# Patient Record
Sex: Male | Born: 1940
Health system: Southern US, Community
[De-identification: ages and names within clinical notes are randomized; demographics above are authoritative.]

## PROBLEM LIST (undated history)

## (undated) DIAGNOSIS — R569 Unspecified convulsions: Secondary | ICD-10-CM

## (undated) DIAGNOSIS — H269 Unspecified cataract: Secondary | ICD-10-CM

## (undated) DIAGNOSIS — K219 Gastro-esophageal reflux disease without esophagitis: Secondary | ICD-10-CM

## (undated) DIAGNOSIS — H409 Unspecified glaucoma: Secondary | ICD-10-CM

## (undated) DIAGNOSIS — C20 Malignant neoplasm of rectum: Secondary | ICD-10-CM

## (undated) DIAGNOSIS — H9190 Unspecified hearing loss, unspecified ear: Secondary | ICD-10-CM

## (undated) DIAGNOSIS — N400 Enlarged prostate without lower urinary tract symptoms: Secondary | ICD-10-CM

## (undated) DIAGNOSIS — M1990 Unspecified osteoarthritis, unspecified site: Secondary | ICD-10-CM

## (undated) DIAGNOSIS — C649 Malignant neoplasm of unspecified kidney, except renal pelvis: Secondary | ICD-10-CM

## (undated) HISTORY — PX: COLONOSCOPY: SHX174

## (undated) HISTORY — PX: APPENDECTOMY: SHX54

## (undated) HISTORY — DX: Unspecified hearing loss, unspecified ear: H91.90

## (undated) HISTORY — DX: Unspecified osteoarthritis, unspecified site: M19.90

## (undated) HISTORY — DX: Unspecified cataract: H26.9

## (undated) HISTORY — DX: Unspecified glaucoma: H40.9

## (undated) SURGERY — Surgical Case
Anesthesia: *Unknown

---

## 2017-03-26 ENCOUNTER — Inpatient Hospital Stay (HOSPITAL_COMMUNITY)
Admission: EM | Admit: 2017-03-26 | Discharge: 2017-04-01 | DRG: 330 | Disposition: A | Discharge status: Home Health | Payer: Medicare HMO | Attending: General Surgery | Admitting: General Surgery

## 2017-03-26 ENCOUNTER — Emergency Department (HOSPITAL_COMMUNITY): Payer: Medicare HMO

## 2017-03-26 ENCOUNTER — Encounter (HOSPITAL_COMMUNITY): Payer: Self-pay | Admitting: Emergency Medicine

## 2017-03-26 DIAGNOSIS — E871 Hypo-osmolality and hyponatremia: Secondary | ICD-10-CM | POA: Diagnosis present

## 2017-03-26 DIAGNOSIS — K56609 Unspecified intestinal obstruction, unspecified as to partial versus complete obstruction: Secondary | ICD-10-CM | POA: Diagnosis present

## 2017-03-26 DIAGNOSIS — C642 Malignant neoplasm of left kidney, except renal pelvis: Secondary | ICD-10-CM | POA: Diagnosis present

## 2017-03-26 DIAGNOSIS — D49519 Neoplasm of unspecified behavior of unspecified kidney: Secondary | ICD-10-CM

## 2017-03-26 DIAGNOSIS — C189 Malignant neoplasm of colon, unspecified: Secondary | ICD-10-CM | POA: Diagnosis present

## 2017-03-26 DIAGNOSIS — N2889 Other specified disorders of kidney and ureter: Secondary | ICD-10-CM

## 2017-03-26 DIAGNOSIS — R1310 Dysphagia, unspecified: Secondary | ICD-10-CM | POA: Diagnosis present

## 2017-03-26 DIAGNOSIS — D62 Acute posthemorrhagic anemia: Secondary | ICD-10-CM | POA: Diagnosis not present

## 2017-03-26 DIAGNOSIS — Z9049 Acquired absence of other specified parts of digestive tract: Secondary | ICD-10-CM

## 2017-03-26 DIAGNOSIS — R739 Hyperglycemia, unspecified: Secondary | ICD-10-CM | POA: Diagnosis present

## 2017-03-26 DIAGNOSIS — Z87891 Personal history of nicotine dependence: Secondary | ICD-10-CM | POA: Diagnosis not present

## 2017-03-26 DIAGNOSIS — Z0189 Encounter for other specified special examinations: Secondary | ICD-10-CM

## 2017-03-26 DIAGNOSIS — K6389 Other specified diseases of intestine: Secondary | ICD-10-CM

## 2017-03-26 HISTORY — DX: Gastro-esophageal reflux disease without esophagitis: K21.9

## 2017-03-26 HISTORY — DX: Unspecified convulsions: R56.9

## 2017-03-26 LAB — CBC WITH DIFFERENTIAL/PLATELET
BASOS PCT: 0 %
Basophils Absolute: 0 10*3/uL (ref 0.0–0.1)
EOS ABS: 0 10*3/uL (ref 0.0–0.7)
EOS PCT: 0 %
HCT: 36.7 % — ABNORMAL LOW (ref 39.0–52.0)
Hemoglobin: 12.4 g/dL — ABNORMAL LOW (ref 13.0–17.0)
LYMPHS ABS: 0.9 10*3/uL (ref 0.7–4.0)
Lymphocytes Relative: 6 %
MCH: 29 pg (ref 26.0–34.0)
MCHC: 33.8 g/dL (ref 30.0–36.0)
MCV: 85.9 fL (ref 78.0–100.0)
MONO ABS: 0.9 10*3/uL (ref 0.1–1.0)
MONOS PCT: 7 %
Neutro Abs: 11.7 10*3/uL — ABNORMAL HIGH (ref 1.7–7.7)
Neutrophils Relative %: 87 %
PLATELETS: 327 10*3/uL (ref 150–400)
RBC: 4.27 MIL/uL (ref 4.22–5.81)
RDW: 13.1 % (ref 11.5–15.5)
WBC: 13.5 10*3/uL — ABNORMAL HIGH (ref 4.0–10.5)

## 2017-03-26 LAB — COMPREHENSIVE METABOLIC PANEL
ALK PHOS: 63 U/L (ref 38–126)
ALT: 20 U/L (ref 17–63)
AST: 19 U/L (ref 15–41)
Albumin: 3.1 g/dL — ABNORMAL LOW (ref 3.5–5.0)
Anion gap: 8 (ref 5–15)
BUN: 13 mg/dL (ref 6–20)
CALCIUM: 8.9 mg/dL (ref 8.9–10.3)
CHLORIDE: 101 mmol/L (ref 101–111)
CO2: 24 mmol/L (ref 22–32)
CREATININE: 0.88 mg/dL (ref 0.61–1.24)
GFR calc non Af Amer: >60 mL/min (ref >60)
Glucose, Bld: 203 mg/dL — ABNORMAL HIGH (ref 65–99)
Potassium: 3.8 mmol/L (ref 3.5–5.1)
SODIUM: 133 mmol/L — AB (ref 135–145)
Total Bilirubin: 0.6 mg/dL (ref 0.3–1.2)
Total Protein: 6.7 g/dL (ref 6.5–8.1)

## 2017-03-26 LAB — URINALYSIS, ROUTINE W REFLEX MICROSCOPIC
BILIRUBIN URINE: NEGATIVE
Glucose, UA: NEGATIVE mg/dL
HGB URINE DIPSTICK: NEGATIVE
KETONES UR: NEGATIVE mg/dL
Leukocytes, UA: NEGATIVE
NITRITE: NEGATIVE
Protein, ur: NEGATIVE mg/dL
SPECIFIC GRAVITY, URINE: 1.044 — AB (ref 1.005–1.030)
pH: 5 (ref 5.0–8.0)

## 2017-03-26 LAB — LIPASE, BLOOD: LIPASE: 23 U/L (ref 11–51)

## 2017-03-26 LAB — GLUCOSE, CAPILLARY: Glucose-Capillary: 179 mg/dL — ABNORMAL HIGH (ref 65–99)

## 2017-03-26 MED ORDER — INSULIN ASPART 100 UNIT/ML ~~LOC~~ SOLN
0.0000 [IU] | SUBCUTANEOUS | Status: DC
  Administered 2017-03-27 (×6): 3 [IU] via SUBCUTANEOUS
  Administered 2017-03-28: 5 [IU] via SUBCUTANEOUS
  Administered 2017-03-28 (×2): 3 [IU] via SUBCUTANEOUS
  Administered 2017-03-28: 5 [IU] via SUBCUTANEOUS
  Administered 2017-03-29: 3 [IU] via SUBCUTANEOUS
  Administered 2017-03-29 (×2): 2 [IU] via SUBCUTANEOUS
  Administered 2017-03-29: 3 [IU] via SUBCUTANEOUS
  Administered 2017-03-29 – 2017-04-01 (×8): 2 [IU] via SUBCUTANEOUS

## 2017-03-26 MED ORDER — MORPHINE SULFATE (PF) 2 MG/ML IV SOLN: 2.0000 mg | INTRAVENOUS | Status: DC | PRN

## 2017-03-26 MED ORDER — PANTOPRAZOLE SODIUM 40 MG IV SOLR
40.0000 mg | Freq: Every day | INTRAVENOUS | Status: DC
  Administered 2017-03-26 – 2017-03-30 (×5): 40 mg via INTRAVENOUS
  Filled 2017-03-26 (×5): qty 40

## 2017-03-26 MED ORDER — ONDANSETRON 4 MG PO TBDP: 4.0000 mg | ORAL_TABLET | Freq: Four times a day (QID) | ORAL | Status: DC | PRN

## 2017-03-26 MED ORDER — IOPAMIDOL (ISOVUE-300) INJECTION 61%
INTRAVENOUS | Status: AC
  Administered 2017-03-26: 100 mL
  Filled 2017-03-26: qty 100

## 2017-03-26 MED ORDER — DIPHENHYDRAMINE HCL 50 MG/ML IJ SOLN
12.5000 mg | Freq: Four times a day (QID) | INTRAMUSCULAR | Status: DC | PRN
  Administered 2017-03-31: 12.5 mg via INTRAVENOUS
  Filled 2017-03-26: qty 1

## 2017-03-26 MED ORDER — POTASSIUM CHLORIDE 2 MEQ/ML IV SOLN
INTRAVENOUS | Status: DC
  Administered 2017-03-26 – 2017-03-27 (×4): via INTRAVENOUS
  Filled 2017-03-26 (×5): qty 1000

## 2017-03-26 MED ORDER — DIPHENHYDRAMINE HCL 12.5 MG/5ML PO ELIX: 12.5000 mg | ORAL_SOLUTION | Freq: Four times a day (QID) | ORAL | Status: DC | PRN

## 2017-03-26 MED ORDER — ONDANSETRON HCL 4 MG/2ML IJ SOLN: 4.0000 mg | Freq: Four times a day (QID) | INTRAMUSCULAR | Status: DC | PRN

## 2017-03-26 NOTE — ED Provider Notes (Signed)
[facility] DEPT Provider Note   CSN: [id number] Arrival date & time: 03/26/17  1035     History   Chief Complaint Chief Complaint  Patient presents with  . Constipation    HPI Romney Compean is a 76 y.o. male.  76yo M who p/w abdominal pain and constipation. He began having diffuse abdominal pain and discomfort a few weeks ago that has been intermittent and mild. It became worse last night and had been severe although it is mild currently. BMs have been looser than normal but non-bloody, and he reports associated gas. He takes aspirin every other day for pain. He does endorse alcohol use more on the weekends. No associated nausea/vomiting. He reports recent mild dysuria but none currently. No fevers, chest pain or SOB.   The history is provided by the patient.  Constipation      History reviewed. No pertinent past medical history.  There are no active problems to display for this patient.   No past surgical history on file.     Home Medications    Prior to Admission medications   Medication Sig Start Date End Date Taking? Authorizing Provider  aspirin 81 MG chewable tablet Chew 81 mg by mouth every other day.   Yes Historical Provider, MD  [employer] ANTACID/ANTI-GAS 400-400-40 MG/5ML suspension Take 30 mLs by mouth 3 (three) times daily as needed for nausea. 03/16/17  Yes Historical Provider, MD  [employer] D3 1000 units capsule Take 1,000 Units by mouth daily. 02/01/17  Yes Historical Provider, MD  omeprazole (PRILOSEC) 20 MG capsule Take 20 mg by mouth 2 (two) times daily. 03/16/17  Yes Historical Provider, MD    Family History No family history on file.  Social History Social History  Substance Use Topics  . Smoking status: Not on file  . Smokeless tobacco: Not on file  . Alcohol use Not on file     Allergies   Patient has no known allergies.   Review of Systems Review of Systems  Gastrointestinal: Positive for constipation.  All other systems reviewed and are  negative.    Physical Exam Updated Vital Signs BP (!) 143/89   Pulse 81   Temp 97.9 F (36.6 C) (Oral)   Resp 18   Ht 5\' 11"  (1.803 m)   Wt 196 lb (88.9 kg)   SpO2 93%   BMI 27.34 kg/m   Physical Exam  Constitutional: He is oriented to person, place, and time. He appears well-developed and well-nourished. No distress.  HENT:  Head: Normocephalic and atraumatic.  Moist mucous membranes  Eyes: Conjunctivae are normal. Pupils are equal, round, and reactive to light.  Neck: Neck supple.  Cardiovascular: Normal rate, regular rhythm and normal heart sounds.   No murmur heard. Pulmonary/Chest: Effort normal and breath sounds normal.  Abdominal: Soft. Bowel sounds are normal. He exhibits distension. There is tenderness. There is no rebound and no guarding.  Mild RLQ and suprapubic abd tenderness, abdomen distended but soft, no peritonitis  Musculoskeletal: He exhibits no edema.  Neurological: He is alert and oriented to person, place, and time.  Fluent speech Hard of hearing  Skin: Skin is warm and dry.  Psychiatric: He has a normal mood and affect. Judgment normal.  Nursing note and vitals reviewed.    ED Treatments / Results  Labs (all labs ordered are listed, but only abnormal results are displayed) Labs Reviewed  COMPREHENSIVE METABOLIC PANEL - Abnormal; Notable for the following:       Result Value  Sodium 133 (*)    Glucose, Bld 203 (*)    Albumin 3.1 (*)    All other components within normal limits  CBC WITH DIFFERENTIAL/PLATELET - Abnormal; Notable for the following:    WBC 13.5 (*)    Hemoglobin 12.4 (*)    HCT 36.7 (*)    Neutro Abs 11.7 (*)    All other components within normal limits  URINALYSIS, ROUTINE W REFLEX MICROSCOPIC - Abnormal; Notable for the following:    Specific Gravity, Urine 1.044 (*)    All other components within normal limits  LIPASE, BLOOD    EKG  EKG Interpretation None       Radiology Ct Abdomen Pelvis W Contrast  Result  Date: 03/26/2017 CLINICAL DATA:  Abdominal pain. No bowel movement. Mildly Elevated white blood cell count. EXAM: CT ABDOMEN AND PELVIS WITH CONTRAST TECHNIQUE: Multidetector CT imaging of the abdomen and pelvis was performed using the standard protocol following bolus administration of intravenous contrast. CONTRAST:  16mL ISOVUE-300 IOPAMIDOL (ISOVUE-300) INJECTION 61% COMPARISON:  None. FINDINGS: Lower chest: Lung bases are clear. Hepatobiliary: No focal hepatic lesion. No biliary duct dilatation. Gallbladder is normal. Common bile duct is normal. Pancreas: Pancreas is normal. No ductal dilatation. No pancreatic inflammation. Spleen: Normal spleen Adrenals/urinary tract: Adrenal glands normal. Along the anterior cortex of theLEFT kidney is a 2.6 cm round enhancing lesion (image 41, series 3). The ureters and bladder normal. Stomach/Bowel: Stomach, duodenum, small bowel are normal. Appendix is not identified. The cecum and ascending colon are gas-filled and distended. The transverse colon is gas-filled and moderately distended. There is long segment of circumferential bowel wall thickening to 1 cm and luminal narrowing involving the splenic flexure of the colon extending over approximately 12 cm segment (image 60, series 6). There is pericolonic inflammation within the adjacent pericolonic fat the mesenteric. There is fluid along the LEFT pericolic gutter (image 43, series 3). There are no diverticulum through this region. There are several diverticulum more distally in the descending colon and sigmoid colon. There is a region of low attenuation within the bowel wall along the anti mesenteric border measuring 18 mm by 13 mm (image 40, series 3). No perforation or abscess. Distal to this segment of circumferential thickening and diffuse bowel wall thickening the sigmoid colon is collapsed. The rectum is collapsed. Vascular/Lymphatic: Minimal intimal calcification of the abdominal aorta. The celiac axis and SMA are  widely patent. IMA is patent. Reproductive: Prostate normal Other: No free fluid.  Fat filled LEFT inguinal hernia. Musculoskeletal: No aggressive osseous lesion. IMPRESSION: 1. 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 Differential would also include segmental colitis including ischemic colitis. The inflammation and fluid along the pericolic gutter would favor colitis. The partial obstructive pattern would favor colorectal carcinoma. No significant diverticular disease through this region. 2. Distention of the colon proximal to the segmental colonic lesion consistent with partial mechanical obstruction by the lesion. 3. No evidence of lymphadenopathy in the abdomen pelvis. 4. 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 Electronically Signed   By: Suzy Bouchard M.D.   On: 03/26/2017 15:21    Procedures Procedures (including critical care time)  Medications Ordered in ED Medications  iopamidol (ISOVUE-300) 61 % injection (100 mLs  Contrast Given 03/26/17 1423)     Initial Impression / Assessment and Plan / ED Course  I have reviewed the triage  vital signs and the nursing notes.  Pertinent labs & imaging results that were available during my care of the patient were reviewed by me and considered in my medical decision making (see chart for details).    PT w/ 1 week of Intermittent abdominal pain and bloating. He had reassuring vital signs and was well-appearing on exam. He did have abdominal distention but abdomen was soft, mild tenderness in lower abdomen but no rebound or peritonitis. Labwork shows WBC 13.5, glucose 203, otherwise unremarkable. Obtained CT of abdomen and pelvis to evaluate for acute process such as bowel obstruction or ileus.  CT shows partial distal obstruction with long segment of colon in thick and  concerning for colorectal carcinoma. 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 I discussed findings with general surgery, Dr. Grandville Silos, appreciate his assistance. General surgery will evaluate the patient and I anticipate admission for further management of his obstructing mass. Final Clinical Impressions(s) / ED Diagnoses   Final diagnoses:  None    New Prescriptions New Prescriptions   No medications on file     Sharlett Iles, MD 03/26/17 [phone]

## 2017-03-26 NOTE — ED Triage Notes (Signed)
Pt reports his last normal BM was several weeks ago. Pt had a small BM last night. Pt reports increased bloating, belching and gas. PT reports abdominal discomfort over the last several days.

## 2017-03-26 NOTE — ED Notes (Signed)
Patient family was given Catheryn Bacon, White Crackers and Peanut Butter, W/ a Coke.

## 2017-03-26 NOTE — H&P (Signed)
Shawn Chambers is an 76 y.o. male.   Chief Complaint: Abdominal distention, crampy abdominal pain, belching HPI: Kylian complains of several weeks of progressive abdominal distention. He has had a lot of belching and passing gas from below, more than usual. He has not vomited but he says most things he has tried to eat did not agree with him. He developed some abdominal pain last night and came to the emergency department. Workup revealed mild hyponatremia, leukocytosis of 13,500, and CT scan of the abdomen and pelvis was performed. This demonstrates a long segment obstructing process in the splenic flexure, colon cancer versus inflammatory process. Incidentally, he also has a 2.6CM L renal mass consistent with a carcinoma. I was asked to see him for surgical management. He does not see doctors regularly and denies any significant health problems. He has not had a colonoscopy. He quit smoking 2 weeks ago.  History reviewed. No pertinent past medical history.  Past surgical history: Appendectomy, right wrist surgery  No family history on file. Social History: Quit smoking 2 weeks ago, no alcohol or drug use  Allergies: No Known Allergies   (Not in a hospital admission)  Results for orders placed or performed during the hospital encounter of 03/26/17 (from the past 48 hour(s))  Comprehensive metabolic panel     Status: Abnormal   Collection Time: 03/26/17 11:27 AM  Result Value Ref Range   Sodium 133 (L) 135 - 145 mmol/L   Potassium 3.8 3.5 - 5.1 mmol/L   Chloride 101 101 - 111 mmol/L   CO2 24 22 - 32 mmol/L   Glucose, Bld 203 (H) 65 - 99 mg/dL   BUN 13 6 - 20 mg/dL   Creatinine, Ser 0.88 0.61 - 1.24 mg/dL   Calcium 8.9 8.9 - 10.3 mg/dL   Total Protein 6.7 6.5 - 8.1 g/dL   Albumin 3.1 (L) 3.5 - 5.0 g/dL   AST 19 15 - 41 U/L   ALT 20 17 - 63 U/L   Alkaline Phosphatase 63 38 - 126 U/L   Total Bilirubin 0.6 0.3 - 1.2 mg/dL   GFR calc non Af Amer >60 >60 mL/min   GFR calc Af Amer >60 >60  mL/min    Comment: (NOTE) The eGFR has been calculated using the CKD EPI equation. This calculation has not been validated in all clinical situations. eGFR's persistently <60 mL/min signify possible Chronic Kidney Disease.    Anion gap 8 5 - 15  CBC with Differential     Status: Abnormal   Collection Time: 03/26/17 11:27 AM  Result Value Ref Range   WBC 13.5 (H) 4.0 - 10.5 K/uL   RBC 4.27 4.22 - 5.81 MIL/uL   Hemoglobin 12.4 (L) 13.0 - 17.0 g/dL   HCT 36.7 (L) 39.0 - 52.0 %   MCV 85.9 78.0 - 100.0 fL   MCH 29.0 26.0 - 34.0 pg   MCHC 33.8 30.0 - 36.0 g/dL   RDW 13.1 11.5 - 15.5 %   Platelets 327 150 - 400 K/uL   Neutrophils Relative % 87 %   Neutro Abs 11.7 (H) 1.7 - 7.7 K/uL   Lymphocytes Relative 6 %   Lymphs Abs 0.9 0.7 - 4.0 K/uL   Monocytes Relative 7 %   Monocytes Absolute 0.9 0.1 - 1.0 K/uL   Eosinophils Relative 0 %   Eosinophils Absolute 0.0 0.0 - 0.7 K/uL   Basophils Relative 0 %   Basophils Absolute 0.0 0.0 - 0.1 K/uL  Lipase, blood  Status: None   Collection Time: 03/26/17 11:27 AM  Result Value Ref Range   Lipase 23 11 - 51 U/L  Urinalysis, Routine w reflex microscopic     Status: Abnormal   Collection Time: 03/26/17  2:50 PM  Result Value Ref Range   Color, Urine YELLOW YELLOW   APPearance CLEAR CLEAR   Specific Gravity, Urine 1.044 (H) 1.005 - 1.030   pH 5.0 5.0 - 8.0   Glucose, UA NEGATIVE NEGATIVE mg/dL   Hgb urine dipstick NEGATIVE NEGATIVE   Bilirubin Urine NEGATIVE NEGATIVE   Ketones, ur NEGATIVE NEGATIVE mg/dL   Protein, ur NEGATIVE NEGATIVE mg/dL   Nitrite NEGATIVE NEGATIVE   Leukocytes, UA NEGATIVE NEGATIVE   Ct Abdomen Pelvis W Contrast  Result Date: 03/26/2017 CLINICAL DATA:  Abdominal pain. No bowel movement. Mildly Elevated white blood cell count. EXAM: CT ABDOMEN AND PELVIS WITH CONTRAST TECHNIQUE: Multidetector CT imaging of the abdomen and pelvis was performed using the standard protocol following bolus administration of intravenous  contrast. CONTRAST:  143m ISOVUE-300 IOPAMIDOL (ISOVUE-300) INJECTION 61% COMPARISON:  None. FINDINGS: Lower chest: Lung bases are clear. Hepatobiliary: No focal hepatic lesion. No biliary duct dilatation. Gallbladder is normal. Common bile duct is normal. Pancreas: Pancreas is normal. No ductal dilatation. No pancreatic inflammation. Spleen: Normal spleen Adrenals/urinary tract: Adrenal glands normal. Along the anterior cortex of theLEFT kidney is a 2.6 cm round enhancing lesion (image 41, series 3). The ureters and bladder normal. Stomach/Bowel: Stomach, duodenum, small bowel are normal. Appendix is not identified. The cecum and ascending colon are gas-filled and distended. The transverse colon is gas-filled and moderately distended. There is long segment of circumferential bowel wall thickening to 1 cm and luminal narrowing involving the splenic flexure of the colon extending over approximately 12 cm segment (image 60, series 6). There is pericolonic inflammation within the adjacent pericolonic fat the mesenteric. There is fluid along the LEFT pericolic gutter (image 43, series 3). There are no diverticulum through this region. There are several diverticulum more distally in the descending colon and sigmoid colon. There is a region of low attenuation within the bowel wall along the anti mesenteric border measuring 18 mm by 13 mm (image 40, series 3). No perforation or abscess. Distal to this segment of circumferential thickening and diffuse bowel wall thickening the sigmoid colon is collapsed. The rectum is collapsed. Vascular/Lymphatic: Minimal intimal calcification of the abdominal aorta. The celiac axis and SMA are widely patent. IMA is patent. Reproductive: Prostate normal Other: No free fluid.  Fat filled LEFT inguinal hernia. Musculoskeletal: No aggressive osseous lesion. IMPRESSION: 1. 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 Differential would also include segmental colitis including ischemic colitis. The inflammation and fluid along the pericolic gutter would favor colitis. The partial obstructive pattern would favor colorectal carcinoma. No significant diverticular disease through this region. 2. Distention of the colon proximal to the segmental colonic lesion consistent with partial mechanical obstruction by the lesion. 3. No evidence of lymphadenopathy in the abdomen pelvis. 4. 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 Electronically Signed   By: SSuzy BouchardM.D.   On: 03/26/2017 15:21    Review of Systems  Constitutional: Negative for chills and fever.  HENT: Positive for hearing loss.        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  Respiratory: Negative for cough and shortness of breath.   Cardiovascular: Negative for chest pain.  Gastrointestinal: Positive for abdominal pain, constipation and nausea. Negative for blood in stool and vomiting.  Genitourinary: Negative for dysuria and urgency.  Musculoskeletal: Negative.   Skin: Negative.   Neurological: Positive for dizziness.       Occasional dizziness after the horse injury to his right ear in the 90s  Endo/Heme/Allergies: Negative.   Psychiatric/Behavioral: Negative.     Blood pressure (!) 143/89, pulse 81, temperature 97.9 F (36.6 C), temperature source Oral, resp. rate 18, height _0  (1.803 m), weight 88.9 kg (196 lb), SpO2 93 %. Physical Exam  Constitutional: He is oriented to person, place, and time. He appears well-developed and well-nourished. No distress.  HENT:  Head: Normocephalic.  Right Ear: External ear normal.  Left Ear: External ear normal.  Mouth/Throat: Oropharynx is clear and moist.  Eyes: EOM are normal. Pupils are equal, round, and reactive to light. No  scleral icterus.  Neck: Neck supple. No tracheal deviation present.  Cardiovascular: Normal rate, regular rhythm, normal heart sounds and intact distal pulses.   Respiratory: Effort normal and breath sounds normal. No stridor.  GI: Soft. He exhibits distension. There is no tenderness. There is no rebound and no guarding.  Abdomen very distended with tympany but soft, no tenderness, bowel sounds are hypoactive  Musculoskeletal: Normal range of motion.  Small deformity right wrist with surgical scar  Neurological: He is alert and oriented to person, place, and time.  Skin: Skin is warm and dry.  Psychiatric: He has a normal mood and affect.     Assessment/Plan Obstructing colon mass at the splenic flexure - likely tumor but could be inflammatory. Admit, IV fluids, NG tube. I consulted Dr. Collene Mares for consideration of colonoscopy. He will need resection of this area with a colostomy this admission. Check CEA. Obtain CT chest.  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 I have consulted Dr. Alinda Money.  Hyperglycemia - SSI  Leiloni Smithers E, MD 03/26/2017, 5:29 PM

## 2017-03-27 ENCOUNTER — Inpatient Hospital Stay (HOSPITAL_COMMUNITY): Payer: Medicare HMO

## 2017-03-27 ENCOUNTER — Encounter (HOSPITAL_COMMUNITY): Payer: Self-pay | Admitting: Radiology

## 2017-03-27 LAB — BASIC METABOLIC PANEL
ANION GAP: 10 (ref 5–15)
BUN: 13 mg/dL (ref 6–20)
CO2: 24 mmol/L (ref 22–32)
Calcium: 9 mg/dL (ref 8.9–10.3)
Chloride: 103 mmol/L (ref 101–111)
Creatinine, Ser: 0.96 mg/dL (ref 0.61–1.24)
Glucose, Bld: 188 mg/dL — ABNORMAL HIGH (ref 65–99)
POTASSIUM: 4.1 mmol/L (ref 3.5–5.1)
SODIUM: 137 mmol/L (ref 135–145)

## 2017-03-27 LAB — CBC
HEMATOCRIT: 38.7 % — AB (ref 39.0–52.0)
Hemoglobin: 12.9 g/dL — ABNORMAL LOW (ref 13.0–17.0)
MCH: 28.6 pg (ref 26.0–34.0)
MCHC: 33.3 g/dL (ref 30.0–36.0)
MCV: 85.8 fL (ref 78.0–100.0)
Platelets: 370 10*3/uL (ref 150–400)
RBC: 4.51 MIL/uL (ref 4.22–5.81)
RDW: 13.1 % (ref 11.5–15.5)
WBC: 16 10*3/uL — AB (ref 4.0–10.5)

## 2017-03-27 LAB — GLUCOSE, CAPILLARY
GLUCOSE-CAPILLARY: 160 mg/dL — AB (ref 65–99)
GLUCOSE-CAPILLARY: 177 mg/dL — AB (ref 65–99)
Glucose-Capillary: 177 mg/dL — ABNORMAL HIGH (ref 65–99)
Glucose-Capillary: 163 mg/dL — ABNORMAL HIGH (ref 65–99)
Glucose-Capillary: 165 mg/dL — ABNORMAL HIGH (ref 65–99)

## 2017-03-27 LAB — SURGICAL PCR SCREEN
MRSA, PCR: NEGATIVE
STAPHYLOCOCCUS AUREUS: NEGATIVE

## 2017-03-27 MED ORDER — FLEET ENEMA 7-19 GM/118ML RE ENEM
2.0000 | ENEMA | Freq: Once | RECTAL | Status: AC
  Administered 2017-03-27: 2 via RECTAL
  Filled 2017-03-27: qty 2

## 2017-03-27 MED ORDER — KCL IN DEXTROSE-NACL 20-5-0.9 MEQ/L-%-% IV SOLN
INTRAVENOUS | Status: DC
  Administered 2017-03-27: 1 mL via INTRAVENOUS
  Administered 2017-03-28: 22:00:00 via INTRAVENOUS
  Administered 2017-03-28: 1 mL via INTRAVENOUS
  Administered 2017-03-28 – 2017-03-30 (×5): via INTRAVENOUS
  Filled 2017-03-27 (×12): qty 1000

## 2017-03-27 MED ORDER — DEXTROSE 5 % IV SOLN
2.0000 g | INTRAVENOUS | Status: AC
  Administered 2017-03-28: 2 g via INTRAVENOUS
  Filled 2017-03-27: qty 2

## 2017-03-27 MED ORDER — IOPAMIDOL (ISOVUE-300) INJECTION 61%
INTRAVENOUS | Status: AC
  Administered 2017-03-27: 75 mL
  Filled 2017-03-27: qty 75

## 2017-03-27 NOTE — Progress Notes (Signed)
Found tick on left mid back.

## 2017-03-27 NOTE — Progress Notes (Signed)
Texted [facility] floor call MD about tick.

## 2017-03-27 NOTE — Progress Notes (Signed)
Discussed abdominal film for NG advancement with Dr. Golden Circle and he stated he thought it was OK, not kinked.

## 2017-03-27 NOTE — Consult Note (Signed)
Urology Consult   Physician requesting consult: Dr. Georganna Skeans  Reason for consult: Left renal neoplasm  History of Present Illness: Shawn Chambers is a 76 y.o. gentleman who has had several weeks of abdominal distention, nausea, etc.  CT of the abdomen revealed what appears to be a large splenic flexure mass of the colon.  Incidentally, he was noted to have an enhancing 2.6 cm anterior lower pole left renal mass concerning for renal malignancy.  CT of the chest without pulmonary metastases.  LFTs normal.  Serum Cr normal.  No hematuria or family history of kidney cancer.  He is likely planning to have a colonscopy later today by Dr. Collene Mares.  If this confirms a colon mass suspicious for malignancy, Dr. Grandville Silos is tentatively planning for surgical resection tomorrow morning.  He denies a history of voiding or storage urinary symptoms, hematuria, UTIs, STDs, urolithiasis, GU malignancy/trauma/surgery.  History reviewed. No pertinent past medical history.  PSH: Appendectomy  Current Hospital Medications:  Home Meds:  Current Meds  Medication Sig  . aspirin 81 MG chewable tablet Chew 81 mg by mouth every other day.  . [employer] ANTACID/ANTI-GAS 400-400-40 MG/5ML suspension Take 30 mLs by mouth 3 (three) times daily as needed for nausea.  . [employer] D3 1000 units capsule Take 1,000 Units by mouth daily.  Marland Kitchen omeprazole (PRILOSEC) 20 MG capsule Take 20 mg by mouth 2 (two) times daily.    Scheduled Meds: . insulin aspart  0-15 Units Subcutaneous Q4H  . pantoprazole (PROTONIX) IV  40 mg Intravenous QHS   Continuous Infusions: . dextrose 5 % and 0.9% NaCl 1,000 mL with potassium chloride 20 mEq infusion 150 mL/hr at 03/27/17 0951   PRN Meds:.diphenhydrAMINE **OR** diphenhydrAMINE, morphine injection, ondansetron **OR** ondansetron (ZOFRAN) IV  Allergies: No Known Allergies  No family history on file.  Social History:  has no tobacco, alcohol, and drug history on file.  ROS: A complete  review of systems was performed.  All systems are negative except for pertinent findings as noted.  Physical Exam:  Vital signs in last 24 hours: Temp:  [97.6 F (36.4 C)-98.5 F (36.9 C)] 98.5 F (36.9 C) (04/15 0403) Pulse Rate:  [77-95] 88 (04/15 0403) Resp:  [19-20] 19 (04/15 0403) BP: (122-143)/(77-88) 130/82 (04/15 0403) SpO2:  [92 %-95 %] 95 % (04/15 0403) Constitutional:  Alert and oriented, No acute distress Cardiovascular: Regular rate and rhythm, No JVD Respiratory: Normal respiratory effort, Lungs clear bilaterally GI: Abdomen is distended, non tender, minimal BS GU: No CVA tenderness Lymphatic: No lymphadenopathy Neurologic: Grossly intact, no focal deficits Psychiatric: Normal mood and affect  Laboratory Data:   Recent Labs  03/26/17 1127 03/27/17 0521  WBC 13.5* 16.0*  HGB 12.4* 12.9*  HCT 36.7* 38.7*  PLT 327 370     Recent Labs  03/26/17 1127 03/27/17 0521  NA 133* 137  K 3.8 4.1  CL 101 103  GLUCOSE 203* 188*  BUN 13 13  CALCIUM 8.9 9.0  CREATININE 0.88 0.96     Results for orders placed or performed during the hospital encounter of 03/26/17 (from the past 24 hour(s))  Urinalysis, Routine w reflex microscopic     Status: Abnormal   Collection Time: 03/26/17  2:50 PM  Result Value Ref Range   Color, Urine YELLOW YELLOW   APPearance CLEAR CLEAR   Specific Gravity, Urine 1.044 (H) 1.005 - 1.030   pH 5.0 5.0 - 8.0   Glucose, UA NEGATIVE NEGATIVE mg/dL   Hgb urine dipstick NEGATIVE  NEGATIVE   Bilirubin Urine NEGATIVE NEGATIVE   Ketones, ur NEGATIVE NEGATIVE mg/dL   Protein, ur NEGATIVE NEGATIVE mg/dL   Nitrite NEGATIVE NEGATIVE   Leukocytes, UA NEGATIVE NEGATIVE  Glucose, capillary     Status: Abnormal   Collection Time: 03/26/17 11:56 PM  Result Value Ref Range   Glucose-Capillary 179 (H) 65 - 99 mg/dL  Glucose, capillary     Status: Abnormal   Collection Time: 03/27/17  4:02 AM  Result Value Ref Range   Glucose-Capillary 177 (H) 65  - 99 mg/dL  Basic metabolic panel     Status: Abnormal   Collection Time: 03/27/17  5:21 AM  Result Value Ref Range   Sodium 137 135 - 145 mmol/L   Potassium 4.1 3.5 - 5.1 mmol/L   Chloride 103 101 - 111 mmol/L   CO2 24 22 - 32 mmol/L   Glucose, Bld 188 (H) 65 - 99 mg/dL   BUN 13 6 - 20 mg/dL   Creatinine, Ser 0.96 0.61 - 1.24 mg/dL   Calcium 9.0 8.9 - 10.3 mg/dL   GFR calc non Af Amer >60 >60 mL/min   GFR calc Af Amer >60 >60 mL/min   Anion gap 10 5 - 15  CBC     Status: Abnormal   Collection Time: 03/27/17  5:21 AM  Result Value Ref Range   WBC 16.0 (H) 4.0 - 10.5 K/uL   RBC 4.51 4.22 - 5.81 MIL/uL   Hemoglobin 12.9 (L) 13.0 - 17.0 g/dL   HCT 38.7 (L) 39.0 - 52.0 %   MCV 85.8 78.0 - 100.0 fL   MCH 28.6 26.0 - 34.0 pg   MCHC 33.3 30.0 - 36.0 g/dL   RDW 13.1 11.5 - 15.5 %   Platelets 370 150 - 400 K/uL  Glucose, capillary     Status: Abnormal   Collection Time: 03/27/17  7:39 AM  Result Value Ref Range   Glucose-Capillary 160 (H) 65 - 99 mg/dL   Comment 1 Notify RN   Glucose, capillary     Status: Abnormal   Collection Time: 03/27/17 12:26 PM  Result Value Ref Range   Glucose-Capillary 177 (H) 65 - 99 mg/dL   Comment 1 Notify RN    No results found for this or any previous visit (from the past 240 hour(s)).  Renal Function:  Recent Labs  03/26/17 1127 03/27/17 0521  CREATININE 0.88 0.96   Estimated Creatinine Clearance: 69.7 mL/min (by C-G formula based on SCr of 0.96 mg/dL).  Radiologic Imaging: Ct Chest W Contrast  Result Date: 03/27/2017 CLINICAL DATA:  Partially obstructing LEFT colonic mass. EXAM: CT CHEST WITH CONTRAST TECHNIQUE: Multidetector CT imaging of the chest was performed during intravenous contrast administration. CONTRAST:  57mL ISOVUE-300 IOPAMIDOL (ISOVUE-300) INJECTION 61% COMPARISON:  CT 03/26/2017 FINDINGS: Cardiovascular: No significant vascular findings. Normal heart size. No pericardial effusion. Mediastinum/Nodes: No axillary  supraclavicular adenopathy. No mediastinal hilar adenopathy. No pericardial fluid. Small hiatal hernia. NG tube extends into the distal esophagus. Lungs/Pleura: No suspicious nodularity. Bibasilar linear atelectasis. Upper Abdomen: Mild inflammation in the LEFT quadrant as described on comparison CT. No focal hepatic lesion. Musculoskeletal: No aggressive osseous lesion. IMPRESSION: 1. No pulmonary metastasis. 2. NG tube terminates in the distal esophagus. Consider advancement into stomach. These results will be called to the ordering clinician or representative by the Radiologist Assistant, and communication documented in the PACS or zVision Dashboard. Electronically Signed   By: Suzy Bouchard M.D.   On: 03/27/2017 08:10   Ct  Abdomen Pelvis W Contrast  Result Date: 03/26/2017 CLINICAL DATA:  Abdominal pain. No bowel movement. Mildly Elevated white blood cell count. EXAM: CT ABDOMEN AND PELVIS WITH CONTRAST TECHNIQUE: Multidetector CT imaging of the abdomen and pelvis was performed using the standard protocol following bolus administration of intravenous contrast. CONTRAST:  18mL ISOVUE-300 IOPAMIDOL (ISOVUE-300) INJECTION 61% COMPARISON:  None. FINDINGS: Lower chest: Lung bases are clear. Hepatobiliary: No focal hepatic lesion. No biliary duct dilatation. Gallbladder is normal. Common bile duct is normal. Pancreas: Pancreas is normal. No ductal dilatation. No pancreatic inflammation. Spleen: Normal spleen Adrenals/urinary tract: Adrenal glands normal. Along the anterior cortex of theLEFT kidney is a 2.6 cm round enhancing lesion (image 41, series 3). The ureters and bladder normal. Stomach/Bowel: Stomach, duodenum, small bowel are normal. Appendix is not identified. The cecum and ascending colon are gas-filled and distended. The transverse colon is gas-filled and moderately distended. There is long segment of circumferential bowel wall thickening to 1 cm and luminal narrowing involving the splenic flexure  of the colon extending over approximately 12 cm segment (image 60, series 6). There is pericolonic inflammation within the adjacent pericolonic fat the mesenteric. There is fluid along the LEFT pericolic gutter (image 43, series 3). There are no diverticulum through this region. There are several diverticulum more distally in the descending colon and sigmoid colon. There is a region of low attenuation within the bowel wall along the anti mesenteric border measuring 18 mm by 13 mm (image 40, series 3). No perforation or abscess. Distal to this segment of circumferential thickening and diffuse bowel wall thickening the sigmoid colon is collapsed. The rectum is collapsed. Vascular/Lymphatic: Minimal intimal calcification of the abdominal aorta. The celiac axis and SMA are widely patent. IMA is patent. Reproductive: Prostate normal Other: No free fluid.  Fat filled LEFT inguinal hernia. Musculoskeletal: No aggressive osseous lesion. IMPRESSION: 1. 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 Differential would also include segmental colitis including ischemic colitis. The inflammation and fluid along the pericolic gutter would favor colitis. The partial obstructive pattern would favor colorectal carcinoma. No significant diverticular disease through this region. 2. Distention of the colon proximal to the segmental colonic lesion consistent with partial mechanical obstruction by the lesion. 3. No evidence of lymphadenopathy in the abdomen pelvis. 4. 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 Electronically Signed   By: Suzy Bouchard M.D.   On: 03/26/2017 15:21   Dg Abd Portable 1v  Result Date: 03/27/2017 CLINICAL DATA:  NG tube placement. EXAM: PORTABLE ABDOMEN - 1 VIEW COMPARISON:  CT the abdomen and pelvis 03/26/2017. FINDINGS: The tip of the NG  tube is in the distal esophagus, approximately 10 cm above the GE junction. Multiple dilated loops of large and small bowel are again noted. Bibasilar atelectasis is present. IMPRESSION: 1. The tip of the NG tube is in the distal esophagus, above the GE junction. 2. Persistent dilated loops of large and small bowel. Electronically Signed   By: San Morelle M.D.   On: 03/27/2017 08:25    I independently reviewed the above imaging studies.  Impression/Recommendation  1) Left renal neoplasm suspicious for malignancy:  I recommended he proceed with concomitant left partial nephrectomy at the time of his planned colon resection.  This should not add much morbidity to his procedure overall and the renal mass will be readily accesible at the time  of his colon resection.  I reviewed the likelihood that his mass represents a renal malignancy vs benign entity and explained that surgical resection should be curative if it is malignant. We reviewed the potential risks, complications, and expected recovery process and the possible need for ongoing surveillance if his mass proves to be malignant.  He expressed his understanding and wishes to proceed as planned.  He gives informed consent to proceed tomorrow.  Shawn Chambers,LES 03/27/2017, 2:07 PM    Pryor Curia MD   CC: Dr. Georganna Skeans

## 2017-03-27 NOTE — Consult Note (Signed)
CROSS COVER [facility] UNASSIGNED PATIENT Reason for Consult: Abnormal CT scan ?mass at the splenic flexure. Referring Physician: Dr. Elenora Fender [facility]  Shawn Chambers is an 76 y.o. male.  HPI: [name] is a 76 year old white male who presented to the [facility] Emergency room on 03/25/2017 with worsening abdominal distention and crampy abdominal pain that he's had for several weeks. He's had some nausea without vomiting for the last few weeks as well On evaluation the ER he was found to have a long segment of thickening in this colon at the level of the splenic flexure and a 2.6 cm mass in the lower pole of the left kidney. Patient denies having any vomiting. Dysphagia, odynophagia or reflux symptoms he claims he's had some problems with constipation in the recent past but only for the last 3-4 days he had. He gives a history of having one to 2 nonbloody bowel movements every day till about 4 days ago. He has never had a colonoscopy he denies a family history of colon cancer.  History reviewed. No pertinent past medical history.  PMH 1) Appendectomy 2) right wrist surgery   No family history on file.  Social History:  has no tobacco, alcohol, and drug history on file. He quit smoking 2 weeks ago  Allergies: No Known Allergies  Medications: I have reviewed the patient's current medications.  Results for orders placed or performed during the hospital encounter of 03/26/17 (from the past 48 hour(s))  Comprehensive metabolic panel     Status: Abnormal   Collection Time: 03/26/17 11:27 AM  Result Value Ref Range   Sodium 133 (L) 135 - 145 mmol/L   Potassium 3.8 3.5 - 5.1 mmol/L   Chloride 101 101 - 111 mmol/L   CO2 24 22 - 32 mmol/L   Glucose, Bld 203 (H) 65 - 99 mg/dL   BUN 13 6 - 20 mg/dL   Creatinine, Ser 0.88 0.61 - 1.24 mg/dL   Calcium 8.9 8.9 - 10.3 mg/dL   Total Protein 6.7 6.5 - 8.1 g/dL   Albumin 3.1 (L) 3.5 - 5.0 g/dL   AST 19 15 - 41 U/L   ALT 20 17  - 63 U/L   Alkaline Phosphatase 63 38 - 126 U/L   Total Bilirubin 0.6 0.3 - 1.2 mg/dL   GFR calc non Af Amer >60 >60 mL/min   GFR calc Af Amer >60 >60 mL/min    Comment: (NOTE) The eGFR has been calculated using the CKD EPI equation. This calculation has not been validated in all clinical situations. eGFR's persistently <60 mL/min signify possible Chronic Kidney Disease.    Anion gap 8 5 - 15  CBC with Differential     Status: Abnormal   Collection Time: 03/26/17 11:27 AM  Result Value Ref Range   WBC 13.5 (H) 4.0 - 10.5 K/uL   RBC 4.27 4.22 - 5.81 MIL/uL   Hemoglobin 12.4 (L) 13.0 - 17.0 g/dL   HCT 36.7 (L) 39.0 - 52.0 %   MCV 85.9 78.0 - 100.0 fL   MCH 29.0 26.0 - 34.0 pg   MCHC 33.8 30.0 - 36.0 g/dL   RDW 13.1 11.5 - 15.5 %   Platelets 327 150 - 400 K/uL   Neutrophils Relative % 87 %   Neutro Abs 11.7 (H) 1.7 - 7.7 K/uL   Lymphocytes Relative 6 %   Lymphs Abs 0.9 0.7 - 4.0 K/uL   Monocytes Relative 7 %   Monocytes Absolute 0.9 0.1 - 1.0  K/uL   Eosinophils Relative 0 %   Eosinophils Absolute 0.0 0.0 - 0.7 K/uL   Basophils Relative 0 %   Basophils Absolute 0.0 0.0 - 0.1 K/uL  Lipase, blood     Status: None   Collection Time: 03/26/17 11:27 AM  Result Value Ref Range   Lipase 23 11 - 51 U/L  Urinalysis, Routine w reflex microscopic     Status: Abnormal   Collection Time: 03/26/17  2:50 PM  Result Value Ref Range   Color, Urine YELLOW YELLOW   APPearance CLEAR CLEAR   Specific Gravity, Urine 1.044 (H) 1.005 - 1.030   pH 5.0 5.0 - 8.0   Glucose, UA NEGATIVE NEGATIVE mg/dL   Hgb urine dipstick NEGATIVE NEGATIVE   Bilirubin Urine NEGATIVE NEGATIVE   Ketones, ur NEGATIVE NEGATIVE mg/dL   Protein, ur NEGATIVE NEGATIVE mg/dL   Nitrite NEGATIVE NEGATIVE   Leukocytes, UA NEGATIVE NEGATIVE  Glucose, capillary     Status: Abnormal   Collection Time: 03/26/17 11:56 PM  Result Value Ref Range   Glucose-Capillary 179 (H) 65 - 99 mg/dL  Glucose, capillary     Status: Abnormal    Collection Time: 03/27/17  4:02 AM  Result Value Ref Range   Glucose-Capillary 177 (H) 65 - 99 mg/dL  Basic metabolic panel     Status: Abnormal   Collection Time: 03/27/17  5:21 AM  Result Value Ref Range   Sodium 137 135 - 145 mmol/L   Potassium 4.1 3.5 - 5.1 mmol/L   Chloride 103 101 - 111 mmol/L   CO2 24 22 - 32 mmol/L   Glucose, Bld 188 (H) 65 - 99 mg/dL   BUN 13 6 - 20 mg/dL   Creatinine, Ser 0.96 0.61 - 1.24 mg/dL   Calcium 9.0 8.9 - 10.3 mg/dL   GFR calc non Af Amer >60 >60 mL/min   GFR calc Af Amer >60 >60 mL/min    Comment: (NOTE) The eGFR has been calculated using the CKD EPI equation. This calculation has not been validated in all clinical situations. eGFR's persistently <60 mL/min signify possible Chronic Kidney Disease.    Anion gap 10 5 - 15  CBC     Status: Abnormal   Collection Time: 03/27/17  5:21 AM  Result Value Ref Range   WBC 16.0 (H) 4.0 - 10.5 K/uL   RBC 4.51 4.22 - 5.81 MIL/uL   Hemoglobin 12.9 (L) 13.0 - 17.0 g/dL   HCT 38.7 (L) 39.0 - 52.0 %   MCV 85.8 78.0 - 100.0 fL   MCH 28.6 26.0 - 34.0 pg   MCHC 33.3 30.0 - 36.0 g/dL   RDW 13.1 11.5 - 15.5 %   Platelets 370 150 - 400 K/uL  Glucose, capillary     Status: Abnormal   Collection Time: 03/27/17  7:39 AM  Result Value Ref Range   Glucose-Capillary 160 (H) 65 - 99 mg/dL   Comment 1 Notify RN     Ct Chest W Contrast  Result Date: 03/27/2017 CLINICAL DATA:  Partially obstructing LEFT colonic mass. EXAM: CT CHEST WITH CONTRAST TECHNIQUE: Multidetector CT imaging of the chest was performed during intravenous contrast administration. CONTRAST:  65m ISOVUE-300 IOPAMIDOL (ISOVUE-300) INJECTION 61% COMPARISON:  CT 03/26/2017 FINDINGS: Cardiovascular: No significant vascular findings. Normal heart size. No pericardial effusion. Mediastinum/Nodes: No axillary supraclavicular adenopathy. No mediastinal hilar adenopathy. No pericardial fluid. Small hiatal hernia. NG tube extends into the distal esophagus.  Lungs/Pleura: No suspicious nodularity. Bibasilar linear atelectasis. Upper Abdomen: Mild  inflammation in the LEFT quadrant as described on comparison CT. No focal hepatic lesion. Musculoskeletal: No aggressive osseous lesion. IMPRESSION: 1. No pulmonary metastasis. 2. NG tube terminates in the distal esophagus. Consider advancement into stomach. These results will be called to the ordering clinician or representative by the Radiologist Assistant, and communication documented in the PACS or zVision Dashboard. Electronically Signed   By: Suzy Bouchard M.D.   On: 03/27/2017 08:10   Ct Abdomen Pelvis W Contrast  Result Date: 03/26/2017 CLINICAL DATA:  Abdominal pain. No bowel movement. Mildly Elevated white blood cell count. EXAM: CT ABDOMEN AND PELVIS WITH CONTRAST TECHNIQUE: Multidetector CT imaging of the abdomen and pelvis was performed using the standard protocol following bolus administration of intravenous contrast. CONTRAST:  158m ISOVUE-300 IOPAMIDOL (ISOVUE-300) INJECTION 61% COMPARISON:  None. FINDINGS: Lower chest: Lung bases are clear. Hepatobiliary: No focal hepatic lesion. No biliary duct dilatation. Gallbladder is normal. Common bile duct is normal. Pancreas: Pancreas is normal. No ductal dilatation. No pancreatic inflammation. Spleen: Normal spleen Adrenals/urinary tract: Adrenal glands normal. Along the anterior cortex of theLEFT kidney is a 2.6 cm round enhancing lesion (image 41, series 3). The ureters and bladder normal. Stomach/Bowel: Stomach, duodenum, small bowel are normal. Appendix is not identified. The cecum and ascending colon are gas-filled and distended. The transverse colon is gas-filled and moderately distended. There is long segment of circumferential bowel wall thickening to 1 cm and luminal narrowing involving the splenic flexure of the colon extending over approximately 12 cm segment (image 60, series 6). There is pericolonic inflammation within the adjacent pericolonic fat  the mesenteric. There is fluid along the LEFT pericolic gutter (image 43, series 3). There are no diverticulum through this region. There are several diverticulum more distally in the descending colon and sigmoid colon. There is a region of low attenuation within the bowel wall along the anti mesenteric border measuring 18 mm by 13 mm (image 40, series 3). No perforation or abscess. Distal to this segment of circumferential thickening and diffuse bowel wall thickening the sigmoid colon is collapsed. The rectum is collapsed. Vascular/Lymphatic: Minimal intimal calcification of the abdominal aorta. The celiac axis and SMA are widely patent. IMA is patent. Reproductive: Prostate normal Other: No free fluid.  Fat filled LEFT inguinal hernia. Musculoskeletal: No aggressive osseous lesion. IMPRESSION: 1. 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 Differential would also include segmental colitis including ischemic colitis. The inflammation and fluid along the pericolic gutter would favor colitis. The partial obstructive pattern would favor colorectal carcinoma. No significant diverticular disease through this region. 2. Distention of the colon proximal to the segmental colonic lesion consistent with partial mechanical obstruction by the lesion. 3. No evidence of lymphadenopathy in the abdomen pelvis. 4. 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 Electronically Signed   By: SSuzy BouchardM.D.   On: 03/26/2017 15:21   Dg Abd Portable 1v  Result Date: 03/27/2017 CLINICAL DATA:  NG tube placement. EXAM: PORTABLE ABDOMEN - 1 VIEW COMPARISON:  CT the abdomen and pelvis 03/26/2017. FINDINGS: The tip of the NG tube is in the distal esophagus, approximately 10 cm above the GE junction. Multiple dilated loops of large and small bowel are again noted.  Bibasilar atelectasis is present. IMPRESSION: 1. The tip of the NG tube is in the distal esophagus, above the GE junction. 2. Persistent dilated loops of large  and small bowel. Electronically Signed   By: San Morelle M.D.   On: 03/27/2017 08:25   Review of Systems  Constitutional: Negative.   HENT: Negative.   Eyes: Negative.   Respiratory: Negative.   Cardiovascular: Negative.   Gastrointestinal: Positive for abdominal pain, constipation and nausea. Negative for blood in stool, diarrhea, heartburn, melena and vomiting.  Genitourinary: Negative.   Skin: Negative.   Neurological: Negative.   Endo/Heme/Allergies: Negative.   Psychiatric/Behavioral: Negative.    Blood pressure 130/82, pulse 88, temperature 98.5 F (36.9 C), temperature source Oral, resp. rate 19, height '5\' 11"'  (1.803 m), weight 88.9 kg (196 lb), SpO2 95 %. Physical Exam  Constitutional: He is oriented to person, place, and time. He appears well-developed and well-nourished.  NGT is in placed with 800 cc of output noted  HENT:  Head: Normocephalic and atraumatic.  Eyes: Conjunctivae and EOM are normal. Pupils are equal, round, and reactive to light.  Neck: Normal range of motion. Neck supple.  Cardiovascular: Normal rate and regular rhythm.   Respiratory: Effort normal and breath sounds normal.  GI: Soft. Bowel sounds are normal. He exhibits distension. He exhibits no mass. There is no tenderness. There is no rebound and no guarding.  Neurological: He is alert and oriented to person, place, and time.  Skin: Skin is warm and dry.  Psychiatric: He has a normal mood and affect. His behavior is normal. Judgment and thought content normal.   Assessment/Plan: 1) Abnormal CT scan with a possible mass and splenic flexure versus a long segment of colitis. A colonoscopy is not possible at this time because of the partial bowel obstruction the patient has had from this mass therefore Fleets enemas will be given and a  flexible sigmoidoscopy attempted prior to surgery which is scheduled for tomorrow.  2) Hyperglycemia-on SSC.  Shawn Chambers 03/27/2017, 8:56 AM

## 2017-03-27 NOTE — Progress Notes (Signed)
4th fleets enema given with results, still not clear, unable to do flex sig due to inability to prep properly.

## 2017-03-27 NOTE — Progress Notes (Signed)
Removed tick with sharp nose tweezers, got whole tick. Cleansed the area with iodine and then alcohol, saved tick in specimen cup if needed further.

## 2017-03-27 NOTE — Progress Notes (Signed)
Radiologist called me this am and NGT needs to be advanced about 12 cm, done and 500 cc green drainage came out.

## 2017-03-27 NOTE — Anesthesia Preprocedure Evaluation (Addendum)
Anesthesia Evaluation  Patient identified by MRN, date of birth, ID band Patient awake    Reviewed: Allergy & Precautions, NPO status , Patient's Chart, lab work & pertinent test results  History of Anesthesia Complications Negative for: history of anesthetic complications  Airway Mallampati: II  TM Distance: >3 FB Neck ROM: Full    Dental  (+) Missing, Dental Advisory Given   Pulmonary neg pulmonary ROS, former smoker,    Pulmonary exam normal breath sounds clear to auscultation       Cardiovascular Exercise Tolerance: Good negative cardio ROS Normal cardiovascular exam Rhythm:Regular     Neuro/Psych Seizures -, Well Controlled,  negative neurological ROS  negative psych ROS   GI/Hepatic Neg liver ROS, Bowel prep,GERD  Poorly Controlled,  Endo/Other  negative endocrine ROS  Renal/GU      Musculoskeletal negative musculoskeletal ROS (+)   Abdominal (+) + obese,  Abdomen: rigid. Bowel sounds: normal.NG tube present in left nare. Will decompress stomach prior to induction.   Peds  Hematology negative hematology ROS (+)   Anesthesia Other Findings Day of surgery medications reviewed with the patient.  Reproductive/Obstetrics                           BP Readings from Last 3 Encounters:  03/27/17 128/77   Lab Results  Component Value Date   WBC 14.4 (H) 03/28/2017   HGB 13.4 03/28/2017   HCT 41.1 03/28/2017   MCV 86.9 03/28/2017   PLT 420 (H) 03/28/2017     Chemistry      Component Value Date/Time   NA 141 03/28/2017 0343   K 4.4 03/28/2017 0343   CL 111 03/28/2017 0343   CO2 22 03/28/2017 0343   BUN 12 03/28/2017 0343   CREATININE 0.96 03/28/2017 0343      Component Value Date/Time   CALCIUM 8.8 (L) 03/28/2017 0343   ALKPHOS 55 03/28/2017 0343   AST 18 03/28/2017 0343   ALT 17 03/28/2017 0343   BILITOT 0.4 03/28/2017 0343      Anesthesia Physical Anesthesia  Plan  ASA: III  Anesthesia Plan: General   Post-op Pain Management:    Induction: Intravenous  Airway Management Planned: Oral ETT  Additional Equipment:   Intra-op Plan:   Post-operative Plan: Extubation in OR  Informed Consent: I have reviewed the patients History and Physical, chart, labs and discussed the procedure including the risks, benefits and alternatives for the proposed anesthesia with the patient or authorized representative who has indicated his/her understanding and acceptance.   Dental advisory given  Plan Discussed with: CRNA, Anesthesiologist and Surgeon  Anesthesia Plan Comments:        Anesthesia Quick Evaluation

## 2017-03-27 NOTE — Progress Notes (Signed)
Subjective: NG tube repositioned, draining bilious fluid, working better now He feels distended but not really having much pain. Voiding okay but recorded urine output low IV fluids increased. One tiny stool.   Objective: Vital signs in last 24 hours: Temp:  [97.6 F (36.4 C)-98.5 F (36.9 C)] 98.5 F (36.9 C) (04/15 0403) Pulse Rate:  [77-95] 88 (04/15 0403) Resp:  [18-20] 19 (04/15 0403) BP: (122-154)/(68-93) 130/82 (04/15 0403) SpO2:  [91 %-98 %] 95 % (04/15 0403) Weight:  [196 lb (88.9 kg)] 196 lb (88.9 kg) (04/14 1044) Last BM Date: 03/25/17  Intake/Output from previous day: 04/14 0701 - 04/15 0700 In: 536.7 [I.V.:536.7] Out: 200 [Urine:100; Emesis/NG output:100] Intake/Output this shift: No intake/output data recorded.  General appearance: Alert.  No obvious distress.  Insight reasonably good skin warm and dry Resp: clear to auscultation bilaterally GI: Abdomen distended.  Tympanitic.  No palpable mass.  Not really tender except to deep palpation.  No guarding or rebound.  No hernias noted  Lab Results:   Recent Labs  03/26/17 1127 03/27/17 0521  WBC 13.5* 16.0*  HGB 12.4* 12.9*  HCT 36.7* 38.7*  PLT 327 370   BMET  Recent Labs  03/26/17 1127 03/27/17 0521  NA 133* 137  K 3.8 4.1  CL 101 103  CO2 24 24  GLUCOSE 203* 188*  BUN 13 13  CREATININE 0.88 0.96  CALCIUM 8.9 9.0   PT/INR No results for input(s): LABPROT, INR in the last 72 hours. ABG No results for input(s): PHART, HCO3 in the last 72 hours.  Invalid input(s): PCO2, PO2  Studies/Results: Ct Chest W Contrast  Result Date: 03/27/2017 CLINICAL DATA:  Partially obstructing LEFT colonic mass. EXAM: CT CHEST WITH CONTRAST TECHNIQUE: Multidetector CT imaging of the chest was performed during intravenous contrast administration. CONTRAST:  47mL ISOVUE-300 IOPAMIDOL (ISOVUE-300) INJECTION 61% COMPARISON:  CT 03/26/2017 FINDINGS: Cardiovascular: No significant vascular findings. Normal heart  size. No pericardial effusion. Mediastinum/Nodes: No axillary supraclavicular adenopathy. No mediastinal hilar adenopathy. No pericardial fluid. Small hiatal hernia. NG tube extends into the distal esophagus. Lungs/Pleura: No suspicious nodularity. Bibasilar linear atelectasis. Upper Abdomen: Mild inflammation in the LEFT quadrant as described on comparison CT. No focal hepatic lesion. Musculoskeletal: No aggressive osseous lesion. IMPRESSION: 1. No pulmonary metastasis. 2. NG tube terminates in the distal esophagus. Consider advancement into stomach. These results will be called to the ordering clinician or representative by the Radiologist Assistant, and communication documented in the PACS or zVision Dashboard. Electronically Signed   By: Suzy Bouchard M.D.   On: 03/27/2017 08:10   Ct Abdomen Pelvis W Contrast  Result Date: 03/26/2017 CLINICAL DATA:  Abdominal pain. No bowel movement. Mildly Elevated white blood cell count. EXAM: CT ABDOMEN AND PELVIS WITH CONTRAST TECHNIQUE: Multidetector CT imaging of the abdomen and pelvis was performed using the standard protocol following bolus administration of intravenous contrast. CONTRAST:  11mL ISOVUE-300 IOPAMIDOL (ISOVUE-300) INJECTION 61% COMPARISON:  None. FINDINGS: Lower chest: Lung bases are clear. Hepatobiliary: No focal hepatic lesion. No biliary duct dilatation. Gallbladder is normal. Common bile duct is normal. Pancreas: Pancreas is normal. No ductal dilatation. No pancreatic inflammation. Spleen: Normal spleen Adrenals/urinary tract: Adrenal glands normal. Along the anterior cortex of theLEFT kidney is a 2.6 cm round enhancing lesion (image 41, series 3). The ureters and bladder normal. Stomach/Bowel: Stomach, duodenum, small bowel are normal. Appendix is not identified. The cecum and ascending colon are gas-filled and distended. The transverse colon is gas-filled and moderately distended. There is long  segment of circumferential bowel wall thickening  to 1 cm and luminal narrowing involving the splenic flexure of the colon extending over approximately 12 cm segment (image 60, series 6). There is pericolonic inflammation within the adjacent pericolonic fat the mesenteric. There is fluid along the LEFT pericolic gutter (image 43, series 3). There are no diverticulum through this region. There are several diverticulum more distally in the descending colon and sigmoid colon. There is a region of low attenuation within the bowel wall along the anti mesenteric border measuring 18 mm by 13 mm (image 40, series 3). No perforation or abscess. Distal to this segment of circumferential thickening and diffuse bowel wall thickening the sigmoid colon is collapsed. The rectum is collapsed. Vascular/Lymphatic: Minimal intimal calcification of the abdominal aorta. The celiac axis and SMA are widely patent. IMA is patent. Reproductive: Prostate normal Other: No free fluid.  Fat filled LEFT inguinal hernia. Musculoskeletal: No aggressive osseous lesion. IMPRESSION: 1. 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 Differential would also include segmental colitis including ischemic colitis. The inflammation and fluid along the pericolic gutter would favor colitis. The partial obstructive pattern would favor colorectal carcinoma. No significant diverticular disease through this region. 2. Distention of the colon proximal to the segmental colonic lesion consistent with partial mechanical obstruction by the lesion. 3. No evidence of lymphadenopathy in the abdomen pelvis. 4. 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 Electronically Signed   By: Suzy Bouchard M.D.   On: 03/26/2017 15:21   Dg Abd Portable 1v  Result Date: 03/27/2017 CLINICAL DATA:  NG tube placement. EXAM: PORTABLE ABDOMEN - 1 VIEW COMPARISON:  CT the  abdomen and pelvis 03/26/2017. FINDINGS: The tip of the NG tube is in the distal esophagus, approximately 10 cm above the GE junction. Multiple dilated loops of large and small bowel are again noted. Bibasilar atelectasis is present. IMPRESSION: 1. The tip of the NG tube is in the distal esophagus, above the GE junction. 2. Persistent dilated loops of large and small bowel. Electronically Signed   By: San Morelle M.D.   On: 03/27/2017 08:25    Anti-infectives: Anti-infectives    None      Assessment/Plan: s/p Procedure(s): PARTIAL COLECTOMY/COLOSTOMY NEPHRECTOMY PARTIAL  Obstructing colon mass at the splenic flexure - likely tumor but could be inflammatory. Admit, IV fluids, NG tube.  Dr. Collene Mares consulted for consideration of colonoscopy.  We have sent her a note and we are hopeful that she will do this today so we can proceed with surgery tomorrow  He will need resection of this area with a colostomy this admission. Check CEA.(Pending at this hour) CT chest.--- Negative for metastasis    I had a long talk with the patient.  I told him that Dr. Georganna Skeans is planning partial colectomy with colostomy tomorrow morning at 7:30 AM.  I discussed the indications, details, techniques and risk of the surgery in detail.  He seems to understand and wants to do this so he can eat again. I told him about the renal mass and that Dr. Alinda Money would be talking to him.  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  Dr. Alinda Money has discussed this with Dr. Grandville Silos and they tentatively plan surgery as a combined procedure tomorrow morning at 7:30..  Partial nephrectomy will hopefully be feasible  Hyperglycemia - SSI.  Controlled   LOS: 1 day    Ehan Freas M 03/27/2017

## 2017-03-27 NOTE — Progress Notes (Signed)
Have given pt 3 fleets enemas per order from Dr. Collene Mares.  Pt still having loose light brown stool and a little bit of blood as results.  Discussed with Dr. Collene Mares.  Will given 1 more enema and check results but will probably not be able to perform the flex sig due to inability to prep properly.

## 2017-03-28 ENCOUNTER — Inpatient Hospital Stay (HOSPITAL_COMMUNITY): Payer: Medicare HMO | Admitting: Anesthesiology

## 2017-03-28 ENCOUNTER — Encounter (HOSPITAL_COMMUNITY): Admission: EM | Disposition: A | Discharge status: Home Health | Payer: Self-pay | Source: Home / Self Care

## 2017-03-28 ENCOUNTER — Encounter (HOSPITAL_COMMUNITY): Payer: Self-pay | Admitting: Certified Registered Nurse Anesthetist

## 2017-03-28 DIAGNOSIS — F17210 Nicotine dependence, cigarettes, uncomplicated: Secondary | ICD-10-CM | POA: Insufficient documentation

## 2017-03-28 DIAGNOSIS — Z9049 Acquired absence of other specified parts of digestive tract: Secondary | ICD-10-CM

## 2017-03-28 DIAGNOSIS — K219 Gastro-esophageal reflux disease without esophagitis: Secondary | ICD-10-CM | POA: Insufficient documentation

## 2017-03-28 HISTORY — PX: PARTIAL NEPHRECTOMY: SHX414

## 2017-03-28 HISTORY — PX: PARTIAL COLECTOMY: SHX5273

## 2017-03-28 LAB — CBC
HCT: 41.1 % (ref 39.0–52.0)
HEMATOCRIT: 32.4 % — AB (ref 39.0–52.0)
HEMOGLOBIN: 10.8 g/dL — AB (ref 13.0–17.0)
Hemoglobin: 13.4 g/dL (ref 13.0–17.0)
MCH: 28.3 pg (ref 26.0–34.0)
MCH: 29.2 pg (ref 26.0–34.0)
MCHC: 32.6 g/dL (ref 30.0–36.0)
MCHC: 33.3 g/dL (ref 30.0–36.0)
MCV: 86.9 fL (ref 78.0–100.0)
MCV: 87.6 fL (ref 78.0–100.0)
Platelets: 420 10*3/uL — ABNORMAL HIGH (ref 150–400)
Platelets: 332 10*3/uL (ref 150–400)
RBC: 4.73 MIL/uL (ref 4.22–5.81)
RBC: 3.7 MIL/uL — ABNORMAL LOW (ref 4.22–5.81)
RDW: 13.7 % (ref 11.5–15.5)
RDW: 13.8 % (ref 11.5–15.5)
WBC: 14.4 10*3/uL — AB (ref 4.0–10.5)
WBC: 15.5 10*3/uL — AB (ref 4.0–10.5)

## 2017-03-28 LAB — COMPREHENSIVE METABOLIC PANEL
ALT: 17 U/L (ref 17–63)
AST: 18 U/L (ref 15–41)
Albumin: 2.9 g/dL — ABNORMAL LOW (ref 3.5–5.0)
Alkaline Phosphatase: 55 U/L (ref 38–126)
Anion gap: 8 (ref 5–15)
BILIRUBIN TOTAL: 0.4 mg/dL (ref 0.3–1.2)
BUN: 12 mg/dL (ref 6–20)
CO2: 22 mmol/L (ref 22–32)
CREATININE: 0.96 mg/dL (ref 0.61–1.24)
Calcium: 8.8 mg/dL — ABNORMAL LOW (ref 8.9–10.3)
Chloride: 111 mmol/L (ref 101–111)
GFR calc Af Amer: >60 mL/min (ref >60)
Glucose, Bld: 171 mg/dL — ABNORMAL HIGH (ref 65–99)
Potassium: 4.4 mmol/L (ref 3.5–5.1)
Sodium: 141 mmol/L (ref 135–145)
TOTAL PROTEIN: 6.5 g/dL (ref 6.5–8.1)

## 2017-03-28 LAB — GLUCOSE, CAPILLARY
GLUCOSE-CAPILLARY: 152 mg/dL — AB (ref 65–99)
GLUCOSE-CAPILLARY: 177 mg/dL — AB (ref 65–99)
GLUCOSE-CAPILLARY: 192 mg/dL — AB (ref 65–99)
GLUCOSE-CAPILLARY: 202 mg/dL — AB (ref 65–99)
Glucose-Capillary: 162 mg/dL — ABNORMAL HIGH (ref 65–99)
Glucose-Capillary: 150 mg/dL — ABNORMAL HIGH (ref 65–99)
Glucose-Capillary: 176 mg/dL — ABNORMAL HIGH (ref 65–99)
Glucose-Capillary: 214 mg/dL — ABNORMAL HIGH (ref 65–99)

## 2017-03-28 LAB — BASIC METABOLIC PANEL
ANION GAP: 11 (ref 5–15)
BUN: 14 mg/dL (ref 6–20)
CALCIUM: 8.1 mg/dL — AB (ref 8.9–10.3)
CO2: 22 mmol/L (ref 22–32)
Chloride: 109 mmol/L (ref 101–111)
Creatinine, Ser: 1.33 mg/dL — ABNORMAL HIGH (ref 0.61–1.24)
GFR calc Af Amer: 58 mL/min — ABNORMAL LOW (ref >60)
GFR calc non Af Amer: 50 mL/min — ABNORMAL LOW (ref >60)
GLUCOSE: 190 mg/dL — AB (ref 65–99)
POTASSIUM: 4.5 mmol/L (ref 3.5–5.1)
Sodium: 142 mmol/L (ref 135–145)

## 2017-03-28 LAB — CEA: CEA: 29.3 ng/mL — ABNORMAL HIGH (ref 0.0–4.7)

## 2017-03-28 SURGERY — COLECTOMY, PARTIAL
Anesthesia: General | Site: Abdomen

## 2017-03-28 MED ORDER — FENTANYL CITRATE (PF) 250 MCG/5ML IJ SOLN
INTRAMUSCULAR | Status: AC
  Filled 2017-03-28: qty 5

## 2017-03-28 MED ORDER — HEMOSTATIC AGENTS (NO CHARGE) OPTIME
TOPICAL | Status: DC | PRN
  Administered 2017-03-28: 1 via TOPICAL

## 2017-03-28 MED ORDER — HYDROMORPHONE HCL 1 MG/ML IJ SOLN
INTRAMUSCULAR | Status: AC
  Filled 2017-03-28: qty 0.5

## 2017-03-28 MED ORDER — ROCURONIUM BROMIDE 50 MG/5ML IV SOSY
PREFILLED_SYRINGE | INTRAVENOUS | Status: AC
  Filled 2017-03-28: qty 5

## 2017-03-28 MED ORDER — EPHEDRINE SULFATE-NACL 50-0.9 MG/10ML-% IV SOSY
PREFILLED_SYRINGE | INTRAVENOUS | Status: DC | PRN
  Administered 2017-03-28: 10 mg via INTRAVENOUS

## 2017-03-28 MED ORDER — METHYLENE BLUE 0.5 % INJ SOLN
INTRAVENOUS | Status: AC
  Filled 2017-03-28: qty 10

## 2017-03-28 MED ORDER — LIDOCAINE 2% (20 MG/ML) 5 ML SYRINGE
INTRAMUSCULAR | Status: AC
  Filled 2017-03-28: qty 5

## 2017-03-28 MED ORDER — SUCCINYLCHOLINE CHLORIDE 200 MG/10ML IV SOSY
PREFILLED_SYRINGE | INTRAVENOUS | Status: AC
  Filled 2017-03-28: qty 10

## 2017-03-28 MED ORDER — ONDANSETRON HCL 4 MG/2ML IJ SOLN
INTRAMUSCULAR | Status: DC | PRN
  Administered 2017-03-28 (×2): 4 mg via INTRAVENOUS

## 2017-03-28 MED ORDER — EPHEDRINE 5 MG/ML INJ
INTRAVENOUS | Status: AC
  Filled 2017-03-28: qty 10

## 2017-03-28 MED ORDER — SCOPOLAMINE 1 MG/3DAYS TD PT72: 1.0000 | MEDICATED_PATCH | TRANSDERMAL | Status: DC

## 2017-03-28 MED ORDER — CHLORHEXIDINE GLUCONATE CLOTH 2 % EX PADS: 6.0000 | MEDICATED_PAD | Freq: Once | CUTANEOUS | Status: DC

## 2017-03-28 MED ORDER — LACTATED RINGERS IV SOLN
INTRAVENOUS | Status: DC | PRN
  Administered 2017-03-28: 07:00:00 via INTRAVENOUS

## 2017-03-28 MED ORDER — PROPOFOL 10 MG/ML IV BOLUS
INTRAVENOUS | Status: AC
  Filled 2017-03-28: qty 20

## 2017-03-28 MED ORDER — PHENYLEPHRINE 40 MCG/ML (10ML) SYRINGE FOR IV PUSH (FOR BLOOD PRESSURE SUPPORT)
PREFILLED_SYRINGE | INTRAVENOUS | Status: DC | PRN
  Administered 2017-03-28 (×2): 160 ug via INTRAVENOUS
  Administered 2017-03-28: 40 ug via INTRAVENOUS
  Administered 2017-03-28: 80 ug via INTRAVENOUS

## 2017-03-28 MED ORDER — ORAL CARE MOUTH RINSE
15.0000 mL | Freq: Two times a day (BID) | OROMUCOSAL | Status: DC
  Administered 2017-03-28 – 2017-03-30 (×5): 15 mL via OROMUCOSAL

## 2017-03-28 MED ORDER — 0.9 % SODIUM CHLORIDE (POUR BTL) OPTIME
TOPICAL | Status: DC | PRN
  Administered 2017-03-28: 2000 mL

## 2017-03-28 MED ORDER — HYDROMORPHONE HCL 1 MG/ML IJ SOLN
0.2500 mg | INTRAMUSCULAR | Status: DC | PRN
Start: 2017-03-28 — End: 2017-03-28

## 2017-03-28 MED ORDER — SUGAMMADEX SODIUM 200 MG/2ML IV SOLN
INTRAVENOUS | Status: AC
  Filled 2017-03-28: qty 2

## 2017-03-28 MED ORDER — MORPHINE SULFATE (PF) 4 MG/ML IV SOLN
2.0000 mg | INTRAVENOUS | Status: DC | PRN
Start: 2017-03-28 — End: 2017-03-28

## 2017-03-28 MED ORDER — DEXAMETHASONE SODIUM PHOSPHATE 10 MG/ML IJ SOLN
INTRAMUSCULAR | Status: AC
  Filled 2017-03-28: qty 1

## 2017-03-28 MED ORDER — ALBUMIN HUMAN 5 % IV SOLN
12.5000 g | Freq: Once | INTRAVENOUS | Status: AC
  Administered 2017-03-28: 12.5 g via INTRAVENOUS
  Filled 2017-03-28: qty 250

## 2017-03-28 MED ORDER — SUGAMMADEX SODIUM 200 MG/2ML IV SOLN
INTRAVENOUS | Status: DC | PRN
  Administered 2017-03-28: 200 mg via INTRAVENOUS

## 2017-03-28 MED ORDER — MORPHINE SULFATE (PF) 4 MG/ML IV SOLN
2.0000 mg | INTRAVENOUS | Status: DC | PRN
  Administered 2017-03-28 (×2): 2 mg via INTRAVENOUS
  Administered 2017-03-29: 4 mg via INTRAVENOUS
  Administered 2017-03-29 (×2): 2 mg via INTRAVENOUS
  Administered 2017-03-29 (×2): 4 mg via INTRAVENOUS
  Administered 2017-03-30: 2 mg via INTRAVENOUS
  Filled 2017-03-28 (×8): qty 1

## 2017-03-28 MED ORDER — ONDANSETRON HCL 4 MG/2ML IJ SOLN
INTRAMUSCULAR | Status: AC
  Filled 2017-03-28: qty 2

## 2017-03-28 MED ORDER — BUPIVACAINE LIPOSOME 1.3 % IJ SUSP
20.0000 mL | Freq: Once | INTRAMUSCULAR | Status: DC
  Filled 2017-03-28: qty 20

## 2017-03-28 MED ORDER — LIDOCAINE 2% (20 MG/ML) 5 ML SYRINGE
INTRAMUSCULAR | Status: DC | PRN
  Administered 2017-03-28: 100 mg via INTRAVENOUS

## 2017-03-28 MED ORDER — FENTANYL CITRATE (PF) 100 MCG/2ML IJ SOLN
INTRAMUSCULAR | Status: DC | PRN
  Administered 2017-03-28: 100 ug via INTRAVENOUS
  Administered 2017-03-28 (×4): 50 ug via INTRAVENOUS
  Administered 2017-03-28: 150 ug via INTRAVENOUS
  Administered 2017-03-28: 50 ug via INTRAVENOUS

## 2017-03-28 MED ORDER — ALBUMIN HUMAN 5 % IV SOLN
INTRAVENOUS | Status: DC | PRN
  Administered 2017-03-28 (×4): via INTRAVENOUS

## 2017-03-28 MED ORDER — MIDAZOLAM HCL 2 MG/2ML IJ SOLN
INTRAMUSCULAR | Status: AC
  Filled 2017-03-28: qty 2

## 2017-03-28 MED ORDER — PROMETHAZINE HCL 25 MG/ML IJ SOLN: 6.2500 mg | INTRAMUSCULAR | Status: DC | PRN

## 2017-03-28 MED ORDER — SUCCINYLCHOLINE CHLORIDE 200 MG/10ML IV SOSY
PREFILLED_SYRINGE | INTRAVENOUS | Status: DC | PRN
  Administered 2017-03-28: 120 mg via INTRAVENOUS

## 2017-03-28 MED ORDER — PHENYLEPHRINE 40 MCG/ML (10ML) SYRINGE FOR IV PUSH (FOR BLOOD PRESSURE SUPPORT)
PREFILLED_SYRINGE | INTRAVENOUS | Status: AC
  Filled 2017-03-28: qty 10

## 2017-03-28 MED ORDER — PROPOFOL 10 MG/ML IV BOLUS
INTRAVENOUS | Status: DC | PRN
  Administered 2017-03-28: 130 mg via INTRAVENOUS

## 2017-03-28 MED ORDER — DEXAMETHASONE SODIUM PHOSPHATE 10 MG/ML IJ SOLN
INTRAMUSCULAR | Status: DC | PRN
  Administered 2017-03-28: 10 mg via INTRAVENOUS

## 2017-03-28 MED ORDER — LACTATED RINGERS IV SOLN
INTRAVENOUS | Status: DC | PRN
  Administered 2017-03-28 (×2): via INTRAVENOUS

## 2017-03-28 MED ORDER — ROCURONIUM BROMIDE 10 MG/ML (PF) SYRINGE
PREFILLED_SYRINGE | INTRAVENOUS | Status: DC | PRN
  Administered 2017-03-28: 50 mg via INTRAVENOUS
  Administered 2017-03-28: 30 mg via INTRAVENOUS
  Administered 2017-03-28: 20 mg via INTRAVENOUS

## 2017-03-28 SURGICAL SUPPLY — 60 items
APPLIER CLIP ROT 10 11.4 M/L (STAPLE) ×4
BLADE CLIPPER SURG (BLADE) ×4 IMPLANT
CANISTER SUCT 3000ML PPV (MISCELLANEOUS) ×4 IMPLANT
CHLORAPREP W/TINT 26ML (MISCELLANEOUS) ×4 IMPLANT
CLIP APPLIE ROT 10 11.4 M/L (STAPLE) ×2 IMPLANT
CLIP LIGATING HEM O LOK PURPLE (MISCELLANEOUS) ×4 IMPLANT
COVER MAYO STAND STRL (DRAPES) ×8 IMPLANT
COVER SURGICAL LIGHT HANDLE (MISCELLANEOUS) ×4 IMPLANT
DRAPE HALF SHEET 40X57 (DRAPES) ×4 IMPLANT
DRAPE LAPAROSCOPIC ABDOMINAL (DRAPES) ×4 IMPLANT
DRAPE UTILITY XL STRL (DRAPES) ×8 IMPLANT
DRAPE WARM FLUID 44X44 (DRAPE) ×4 IMPLANT
DRSG OPSITE POSTOP 3X4 (GAUZE/BANDAGES/DRESSINGS) ×4 IMPLANT
DRSG OPSITE POSTOP 4X10 (GAUZE/BANDAGES/DRESSINGS) ×4 IMPLANT
ELECT BLADE 6.5 EXT (BLADE) ×4 IMPLANT
ELECT CAUTERY BLADE 6.4 (BLADE) ×4 IMPLANT
ELECT REM PT RETURN 9FT ADLT (ELECTROSURGICAL) ×4
ELECTRODE REM PT RTRN 9FT ADLT (ELECTROSURGICAL) ×2 IMPLANT
GAUZE SPONGE 4X4 16PLY XRAY LF (GAUZE/BANDAGES/DRESSINGS) ×4 IMPLANT
GLOVE BIO SURGEON STRL SZ8 (GLOVE) ×8 IMPLANT
GLOVE BIOGEL PI IND STRL 8 (GLOVE) ×4 IMPLANT
GLOVE BIOGEL PI INDICATOR 8 (GLOVE) ×4
GOWN STRL REUS W/ TWL LRG LVL3 (GOWN DISPOSABLE) ×6 IMPLANT
GOWN STRL REUS W/ TWL XL LVL3 (GOWN DISPOSABLE) ×2 IMPLANT
GOWN STRL REUS W/TWL LRG LVL3 (GOWN DISPOSABLE) ×6
GOWN STRL REUS W/TWL XL LVL3 (GOWN DISPOSABLE) ×2
KIT BASIN OR (CUSTOM PROCEDURE TRAY) ×4 IMPLANT
KIT OSTOMY DRAINABLE 2.75 STR (WOUND CARE) ×4 IMPLANT
KIT ROOM TURNOVER OR (KITS) ×4 IMPLANT
LIGASURE IMPACT 36 18CM CVD LR (INSTRUMENTS) ×4 IMPLANT
NS IRRIG 1000ML POUR BTL (IV SOLUTION) ×8 IMPLANT
PACK GENERAL/GYN (CUSTOM PROCEDURE TRAY) ×4 IMPLANT
PAD ARMBOARD 7.5X6 YLW CONV (MISCELLANEOUS) ×4 IMPLANT
PENCIL BUTTON HOLSTER BLD 10FT (ELECTRODE) ×4 IMPLANT
SPECIMEN JAR X LARGE (MISCELLANEOUS) ×4 IMPLANT
SPONGE INTESTINAL PEANUT (DISPOSABLE) ×4 IMPLANT
SPONGE LAP 18X18 X RAY DECT (DISPOSABLE) ×12 IMPLANT
STAPLER CUT CVD 40MM BLUE (STAPLE) ×4 IMPLANT
STAPLER PROXIMATE 75MM BLUE (STAPLE) ×4 IMPLANT
STAPLER VISISTAT 35W (STAPLE) ×4 IMPLANT
SUCTION POOLE TIP (SUCTIONS) ×4 IMPLANT
SURGIFLO W/THROMBIN 8M KIT (HEMOSTASIS) ×4 IMPLANT
SUT PDS AB 1 TP1 96 (SUTURE) ×8 IMPLANT
SUT SILK 1 TIES 10X30 (SUTURE) ×4 IMPLANT
SUT SILK 2 0 SH CR/8 (SUTURE) ×4 IMPLANT
SUT SILK 2 0 TIES 10X30 (SUTURE) ×4 IMPLANT
SUT SILK 3 0 SH CR/8 (SUTURE) ×4 IMPLANT
SUT SILK 3 0 TIES 10X30 (SUTURE) ×4 IMPLANT
SUT V-LOC BARB 180 2/0GR6 GS22 (SUTURE) ×4
SUT VIC AB 3-0 SH 18 (SUTURE) ×4 IMPLANT
SUT VLOC BARB 180 ABS3/0GR12 (SUTURE) ×4
SUTURE V-LC BRB 180 2/0GR6GS22 (SUTURE) ×2 IMPLANT
SUTURE VLOC BRB 180 ABS3/0GR12 (SUTURE) ×2 IMPLANT
SYR BULB IRRIGATION 50ML (SYRINGE) ×4 IMPLANT
TOWEL OR 17X26 10 PK STRL BLUE (TOWEL DISPOSABLE) ×8 IMPLANT
TRAY FOLEY W/METER SILVER 14FR (SET/KITS/TRAYS/PACK) ×4 IMPLANT
TUBE CONNECTING 12'X1/4 (SUCTIONS) ×1
TUBE CONNECTING 12X1/4 (SUCTIONS) ×3 IMPLANT
WATER STERILE IRR 1000ML POUR (IV SOLUTION) ×4 IMPLANT
YANKAUER SUCT BULB TIP NO VENT (SUCTIONS) ×4 IMPLANT

## 2017-03-28 NOTE — Anesthesia Procedure Notes (Signed)
Procedure Name: Intubation Date/Time: 03/28/2017 7:45 AM Performed by: Mervyn Gay Pre-anesthesia Checklist: Patient identified, Patient being monitored, Timeout performed, Emergency Drugs available and Suction available Patient Re-evaluated:Patient Re-evaluated prior to inductionOxygen Delivery Method: Circle System Utilized Preoxygenation: Pre-oxygenation with 100% oxygen Intubation Type: IV induction, Cricoid Pressure applied and Rapid sequence Laryngoscope Size: Miller and 3 Grade View: Grade I Tube type: Oral Tube size: 8.0 mm Number of attempts: 1 Airway Equipment and Method: Stylet Placement Confirmation: ETT inserted through vocal cords under direct vision,  positive ETCO2 and breath sounds checked- equal and bilateral Secured at: 21 cm Tube secured with: Tape Dental Injury: Teeth and Oropharynx as per pre-operative assessment

## 2017-03-28 NOTE — Transfer of Care (Signed)
Immediate Anesthesia Transfer of Care Note  Patient: Shawn Chambers  Procedure(s) Performed: Procedure(s) with comments: PARTIAL COLECTOMY/COLOSTOMY (N/A) - Partial Colectomy, Mobilization of Splenic Flexure, Colostomy NEPHRECTOMY TOTAL (Left)  Patient Location: PACU  Anesthesia Type:General  Level of Consciousness: awake, alert , oriented and patient cooperative  Airway & Oxygen Therapy: Patient Spontanous Breathing and Patient connected to nasal cannula oxygen  Post-op Assessment: Report given to RN, Post -op Vital signs reviewed and stable and Patient moving all extremities X 4  Post vital signs: Reviewed and stable  Last Vitals:  Vitals:   03/27/17 1429 03/27/17 1943  BP: 133/71 128/77  Pulse: 89 87  Resp: 19 19  Temp: 36.7 C 36.8 C    Last Pain:  Vitals:   03/27/17 1943  TempSrc: Oral  PainSc:          Complications: No apparent anesthesia complications

## 2017-03-28 NOTE — Progress Notes (Signed)
Pt transfer to OR accompanied by his daughter , report given to OR Nurse.

## 2017-03-28 NOTE — Anesthesia Postprocedure Evaluation (Addendum)
Anesthesia Post Note  Patient: Court Kleman  Procedure(s) Performed: Procedure(s) (LRB): PARTIAL COLECTOMY/COLOSTOMY (N/A) NEPHRECTOMY TOTAL (Left)  Patient location during evaluation: PACU Anesthesia Type: General Level of consciousness: sedated Pain management: pain level controlled Vital Signs Assessment: post-procedure vital signs reviewed and stable Respiratory status: spontaneous breathing and respiratory function stable Cardiovascular status: stable Anesthetic complications: no       Last Vitals:  Vitals:   03/28/17 1146 03/28/17 1158  BP: (!) 150/80   Pulse: 80   Resp: (!) 21   Temp:  36.7 C    Last Pain:  Vitals:   03/28/17 1158  TempSrc:   PainSc: 0-No pain                 Kaelani Kendrick DANIEL

## 2017-03-28 NOTE — Op Note (Signed)
Preoperative diagnosis: Left renal neoplasm  Postoperative diagnosis: Left renal neoplasm  Procedure: Left radical nephrectomy  Surgeon: Pryor Curia. M.D.  Assistant: Georganna Skeans, M.D.  Anesthesia: General  Complications: None  Estimated blood loss: 150 cc  Indication: [name] is a 76 year old gentleman who presented to the hospital with a bowel obstruction.  CT imaging revealed a splenic flexure mass concerning for malignancy versus inflammatory process.  After evaluation by general surgery, it was felt that he would necessitate a left partial colectomy.  In addition, he was incidentally noted to have a 2.6 cm enhancing left renal mass.  There was no evidence of metastatic disease.  I did discuss this situation with Mr. Burch in detail and we discussed proceeding with a concomitant procedure for treatment of his renal neoplasm.  Based on the size of his mass, it was felt that this was amenable to a partial nephrectomy.  However, considering the extensive inflammatory process within the colon, we did discuss the possibility of necessitating total nephrectomy.  We reviewed the potential risks, complications, and expected recovery process associated with treatment of his renal neoplasm.  Specifically, we discussed the risks of worsening renal function, cancer recurrence, possibility of benign pathology, the risk of urine leak, risk of bleeding, and need for possible total nephrectomy.  He gave informed consent.  Description of procedure:  The patient was taken to the operating room and a general anesthetic was administered.  He was given preoperative antibiotics, placed in the supine position, and prepped and draped in the usual sterile fashion.  A preoperative timeout was performed.  The initial portion of the procedure was performed by Dr. Grandville Silos and will be dictated by him.  Once the splenic flexure colon had been resected, attention then turned to the kidney.  The  anterior mass was readily visualized.  Attention then turned to mobilization and isolation of the kidney.  The ureter was not readily identifiable.  Dissection inferiorly and laterally was performed and the entire posterior dissection was able to be performed bluntly.  Superiorly, the splenorenal ligaments were divided with the LigaSure.  This allowed the kidney to be freed on the renal hilum which was palpable but not yet visualized.  The ureter was still unable to be identified at this point.  Further inspection identified the ureter distally near the gonadal vein.  It was traced superiorly and it was revealed that the ureter had been densely adherent to the inflammatory process involved with the colon resulting in division of the ureter.  It was therefore decided to proceed with a radical nephrectomy.  Using a combination of blunt and sharp dissection, the renal hilum was isolated with the renal vein and renal artery separated and isolated with right angle dissection.  Vessel loops were placed around each vessel.  The renal artery was then ligated with multiple Weck clips and divided.  The renal artery was then ligated with multiple #1 silk ties and a 10 mm Weck clip.  It was divided and further dissection proceeded with the LigaSure between the kidney and adrenal gland where Gerota's fascia was entered.  The LigaSure was then used to take down the remaining splenorenal ligaments.  This left the kidney specimen to be freed.  Inspection of the renal fossa revealed adequate hemostasis.  Based on some earlier oozing that had been noted near the upper pole of the kidney and spleen, Surgiflo was administered to this area.  In the stasis appeared excellent at the end of this portion  of the procedure.  The case was then turned back over to Dr. Grandville Silos for the remainder of the procedure which again will be dictated separately.

## 2017-03-28 NOTE — Op Note (Signed)
03/26/2017 - 03/28/2017  11:09 AM  PATIENT:  Shawn Chambers  76 y.o. male  PRE-OPERATIVE DIAGNOSIS:  OBSTRUCTIVE COLONIC MASS, LEFT KIDNEY MASS  POST-OPERATIVE DIAGNOSIS:  OBSTRUCTIVE COLONIC MASS, LEFT KIDNEY MASS  PROCEDURE:  Procedure(s): PARTIAL COLECTOMY/COLOSTOMY MOBILIZATION OF SPLENIC FLEXURE LEFT NEPHRECTOMY TOTAL  SURGEON:  Georganna Skeans, M.D.; Dutch Gray, M.D.  ANESTHESIA:   general  EBL:  Total I/O In: 3400 [I.V.:2400; IV Piggyback:1000] Out: 37 [Urine:160; Blood:600]  BLOOD ADMINISTERED:none  DRAINS: none   SPECIMEN:  Excision  DISPOSITION OF SPECIMEN:  PATHOLOGY  COUNTS:  YES  DICTATION: .Dragon Dictation Findings: Large mass splenic flexure of the colon with significant chronic inflammatory changes and obstruction  Procedure in detail: Osmel was identified in the preop holding area. Informed consent was obtained. He received intravenous antibiotics. He was brought to the operative room and general endotracheal anesthesia was administered by the anesthesia staff. Foley catheter was placed by nursing. His abdomen was prepped and draped in a sterile fashion. We did a time out procedure. Midline incision was made. Subcutaneous tissues were dissected down revealing the anterior fascia. This was divided along the midline. Fascia was opened to the length of the incision. Bookwalter retractor was placed. His abdomen was explored and there was evidence of obstruction at the splenic flexure as expected. Small bowel is moderately dilated as well as all of the colon proximal to this area. The distal transverse colon was divided with a GIA-75 stapler. The gastrocolic omentum was divided heading up towards the splenic flexure with LigaSure. I divided some of the mesentery to the distal transverse colon using the LigaSure. At this time, the descending colon was mobilized from lateral peritoneal attachments. I divided the distal descending colon with a GIA-75 stapler. It was  further mobilized extending cephalad. There was a lot of chronic inflammatory changes along the sidewall associated with this colonic mass. This was gradually and gently mobilized. I was able to take some of the mesentery inferiorly using LigaSure. We returned our attention to the splenic flexure which was then mobilized. The remainder of the gastrocolic omentum was divided and excellent hemostasis was obtained. The mesentery of the splenic flexure was divided with the LigaSure and then we used clamps and suture ligatures as needed. We stayed away from the palpable kidney mass which was right in this location. We also protected the spleen. Some further mesentery was divided inferiorly and the specimen was sent to pathology. It was labeled. There was a touch of hemorrhage along the inferior pole of the spleen which was controlled with clips and cautery. Next, the left nephrectomy was done by Dr. Alinda Money area please refer to his note. At the completion of the nephrectomy we reexamined the spleen area and it was completely hemostatic. A hemostatic agent was applied there by Dr. Alinda Money as well as in the retroperitoneum where the kidney had been. Bowel was returned to anatomic position. I chose a spot in the left upper quadrant for the stoma. Circular incision was made in the skin and subcutaneous fat was removed a cruciate incision was made in the fascia and end of the colon was brought out. Bowel was again confirmed in anatomic position. Sponge count was confirmed. Midline fascia was closed with running #1 looped PDS tied in the middle. Subcutaneous tissues were irrigated and the skin was closed with staples. Colostomy was matured with interrupted 3-0 Vicryl's. An ostomy appliance was applied. He tolerated the procedure well. There were no complications during the colectomy.We will plan to admit  him to ICU for close fluid monitoring postoperatively. He was extubated and brought to recovery.  PATIENT DISPOSITION:  PACU  - guarded condition.   Delay start of Pharmacological VTE agent (>24hrs) due to surgical blood loss or risk of bleeding:  no  Georganna Skeans, MD, MPH, FACS Pager: [phone]  4/16/201811:09 AM

## 2017-03-28 NOTE — Progress Notes (Signed)
Patient ID: Shawn Chambers, male   DOB: [birth year], 76 y.o.   MRN: [id number] I spoke with his daughter on the phone as she left the hospital.  Georganna Skeans, MD, MPH, FACS Trauma: [phone] General Surgery: [phone]

## 2017-03-28 NOTE — Progress Notes (Signed)
Pt NPO midnight for left partial nephrectomy and colectomy with consent, CHG bath done and PCR negative.

## 2017-03-28 NOTE — Progress Notes (Signed)
Day of Surgery  Subjective: CC in pre-op, no new complaints  Objective: Vital signs in last 24 hours: Temp:  [98.1 F (36.7 C)-98.2 F (36.8 C)] 98.2 F (36.8 C) (04/15 1943) Pulse Rate:  [87-89] 87 (04/15 1943) Resp:  [19] 19 (04/15 1943) BP: (128-133)/(71-77) 128/77 (04/15 1943) SpO2:  [95 %-96 %] 95 % (04/15 1943) Last BM Date: 03/27/17  Intake/Output from previous day: 04/15 0701 - 04/16 0700 In: 2147.5 [I.V.:2147.5] Out: 1002 [Emesis/NG output:800; Stool:202] Intake/Output this shift: Total I/O In: 910 [I.V.:910] Out: 200 [Stool:200]  General appearance: cooperative Resp: clear to auscultation bilaterally Cardio: regular rate and rhythm GI: distended, soft. NT  Lab Results:   Recent Labs  03/27/17 0521 03/28/17 0343  WBC 16.0* 14.4*  HGB 12.9* 13.4  HCT 38.7* 41.1  PLT 370 420*   BMET  Recent Labs  03/27/17 0521 03/28/17 0343  NA 137 141  K 4.1 4.4  CL 103 111  CO2 24 22  GLUCOSE 188* 171*  BUN 13 12  CREATININE 0.96 0.96  CALCIUM 9.0 8.8*   PT/INR No results for input(s): LABPROT, INR in the last 72 hours. ABG No results for input(s): PHART, HCO3 in the last 72 hours.  Invalid input(s): PCO2, PO2  Studies/Results: Ct Chest W Contrast  Result Date: 03/27/2017 CLINICAL DATA:  Partially obstructing LEFT colonic mass. EXAM: CT CHEST WITH CONTRAST TECHNIQUE: Multidetector CT imaging of the chest was performed during intravenous contrast administration. CONTRAST:  6mL ISOVUE-300 IOPAMIDOL (ISOVUE-300) INJECTION 61% COMPARISON:  CT 03/26/2017 FINDINGS: Cardiovascular: No significant vascular findings. Normal heart size. No pericardial effusion. Mediastinum/Nodes: No axillary supraclavicular adenopathy. No mediastinal hilar adenopathy. No pericardial fluid. Small hiatal hernia. NG tube extends into the distal esophagus. Lungs/Pleura: No suspicious nodularity. Bibasilar linear atelectasis. Upper Abdomen: Mild inflammation in the LEFT quadrant as  described on comparison CT. No focal hepatic lesion. Musculoskeletal: No aggressive osseous lesion. IMPRESSION: 1. No pulmonary metastasis. 2. NG tube terminates in the distal esophagus. Consider advancement into stomach. These results will be called to the ordering clinician or representative by the Radiologist Assistant, and communication documented in the PACS or zVision Dashboard. Electronically Signed   By: Suzy Bouchard M.D.   On: 03/27/2017 08:10   Ct Abdomen Pelvis W Contrast  Result Date: 03/26/2017 CLINICAL DATA:  Abdominal pain. No bowel movement. Mildly Elevated white blood cell count. EXAM: CT ABDOMEN AND PELVIS WITH CONTRAST TECHNIQUE: Multidetector CT imaging of the abdomen and pelvis was performed using the standard protocol following bolus administration of intravenous contrast. CONTRAST:  171mL ISOVUE-300 IOPAMIDOL (ISOVUE-300) INJECTION 61% COMPARISON:  None. FINDINGS: Lower chest: Lung bases are clear. Hepatobiliary: No focal hepatic lesion. No biliary duct dilatation. Gallbladder is normal. Common bile duct is normal. Pancreas: Pancreas is normal. No ductal dilatation. No pancreatic inflammation. Spleen: Normal spleen Adrenals/urinary tract: Adrenal glands normal. Along the anterior cortex of theLEFT kidney is a 2.6 cm round enhancing lesion (image 41, series 3). The ureters and bladder normal. Stomach/Bowel: Stomach, duodenum, small bowel are normal. Appendix is not identified. The cecum and ascending colon are gas-filled and distended. The transverse colon is gas-filled and moderately distended. There is long segment of circumferential bowel wall thickening to 1 cm and luminal narrowing involving the splenic flexure of the colon extending over approximately 12 cm segment (image 60, series 6). There is pericolonic inflammation within the adjacent pericolonic fat the mesenteric. There is fluid along the LEFT pericolic gutter (image 43, series 3). There are no diverticulum through this  region. There are several diverticulum more distally in the descending colon and sigmoid colon. There is a region of low attenuation within the bowel wall along the anti mesenteric border measuring 18 mm by 13 mm (image 40, series 3). No perforation or abscess. Distal to this segment of circumferential thickening and diffuse bowel wall thickening the sigmoid colon is collapsed. The rectum is collapsed. Vascular/Lymphatic: Minimal intimal calcification of the abdominal aorta. The celiac axis and SMA are widely patent. IMA is patent. Reproductive: Prostate normal Other: No free fluid.  Fat filled LEFT inguinal hernia. Musculoskeletal: No aggressive osseous lesion. IMPRESSION: 1. 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 Differential would also include segmental colitis including ischemic colitis. The inflammation and fluid along the pericolic gutter would favor colitis. The partial obstructive pattern would favor colorectal carcinoma. No significant diverticular disease through this region. 2. Distention of the colon proximal to the segmental colonic lesion consistent with partial mechanical obstruction by the lesion. 3. No evidence of lymphadenopathy in the abdomen pelvis. 4. 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 Electronically Signed   By: Suzy Bouchard M.D.   On: 03/26/2017 15:21   Dg Abd Portable 1v  Result Date: 03/27/2017 CLINICAL DATA:  Check nasogastric catheter placement EXAM: PORTABLE ABDOMEN - 1 VIEW COMPARISON:  03/25/2017 FINDINGS: Nasogastric catheter is been advanced and now lies coiled within the stomach. Scattered large and small bowel gas is noted. Persistent small bowel obstructive changes are seen. IMPRESSION: Nasogastric catheter now coiled within the stomach. Persistent small bowel dilatation is seen. Electronically  Signed   By: Inez Catalina M.D.   On: 03/27/2017 16:55   Dg Abd Portable 1v  Result Date: 03/27/2017 CLINICAL DATA:  NG tube placement. EXAM: PORTABLE ABDOMEN - 1 VIEW COMPARISON:  CT the abdomen and pelvis 03/26/2017. FINDINGS: The tip of the NG tube is in the distal esophagus, approximately 10 cm above the GE junction. Multiple dilated loops of large and small bowel are again noted. Bibasilar atelectasis is present. IMPRESSION: 1. The tip of the NG tube is in the distal esophagus, above the GE junction. 2. Persistent dilated loops of large and small bowel. Electronically Signed   By: San Morelle M.D.   On: 03/27/2017 08:25    Anti-infectives: Anti-infectives    Start     Dose/Rate Route Frequency Ordered Stop   03/28/17 0600  cefoTEtan (CEFOTAN) 2 g in dextrose 5 % 50 mL IVPB     2 g 100 mL/hr over 30 Minutes Intravenous On call to O.R. 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 For partial colectomy and colostomy. Procedure, risks, and benefits discussed again. He agrees.  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 Alinda Money.  Hyperglycemia - SSI.   Controlled  LOS: 2 days    Deryn Massengale E 03/28/2017

## 2017-03-29 ENCOUNTER — Encounter (HOSPITAL_COMMUNITY): Payer: Self-pay | Admitting: General Surgery

## 2017-03-29 LAB — BASIC METABOLIC PANEL
ANION GAP: 6 (ref 5–15)
BUN: 13 mg/dL (ref 6–20)
CHLORIDE: 107 mmol/L (ref 101–111)
CO2: 25 mmol/L (ref 22–32)
Calcium: 7.8 mg/dL — ABNORMAL LOW (ref 8.9–10.3)
Creatinine, Ser: 1.65 mg/dL — ABNORMAL HIGH (ref 0.61–1.24)
GFR calc Af Amer: 45 mL/min — ABNORMAL LOW (ref >60)
GFR, EST NON AFRICAN AMERICAN: 39 mL/min — AB (ref >60)
GLUCOSE: 161 mg/dL — AB (ref 65–99)
POTASSIUM: 4.9 mmol/L (ref 3.5–5.1)
Sodium: 138 mmol/L (ref 135–145)

## 2017-03-29 LAB — GLUCOSE, CAPILLARY
GLUCOSE-CAPILLARY: 153 mg/dL — AB (ref 65–99)
GLUCOSE-CAPILLARY: 143 mg/dL — AB (ref 65–99)
GLUCOSE-CAPILLARY: 151 mg/dL — AB (ref 65–99)
Glucose-Capillary: 143 mg/dL — ABNORMAL HIGH (ref 65–99)
Glucose-Capillary: 135 mg/dL — ABNORMAL HIGH (ref 65–99)

## 2017-03-29 LAB — CBC
HEMATOCRIT: 31.4 % — AB (ref 39.0–52.0)
HEMOGLOBIN: 10.2 g/dL — AB (ref 13.0–17.0)
MCH: 28.3 pg (ref 26.0–34.0)
MCHC: 32.5 g/dL (ref 30.0–36.0)
MCV: 87.2 fL (ref 78.0–100.0)
PLATELETS: 333 10*3/uL (ref 150–400)
RBC: 3.6 MIL/uL — AB (ref 4.22–5.81)
RDW: 13.8 % (ref 11.5–15.5)
WBC: 11.4 10*3/uL — AB (ref 4.0–10.5)

## 2017-03-29 LAB — POCT I-STAT 4, (NA,K, GLUC, HGB,HCT)
Glucose, Bld: 165 mg/dL — ABNORMAL HIGH (ref 65–99)
HCT: 30 % — ABNORMAL LOW (ref 39.0–52.0)
Hemoglobin: 10.2 g/dL — ABNORMAL LOW (ref 13.0–17.0)
Potassium: 4.4 mmol/L (ref 3.5–5.1)
Sodium: 142 mmol/L (ref 135–145)

## 2017-03-29 NOTE — Progress Notes (Signed)
1 Day Post-Op  Subjective: Tolerating sips of clears  Objective: Vital signs in last 24 hours: Temp:  [97.9 F (36.6 C)-100.3 F (37.9 C)] 100.3 F (37.9 C) (04/17 0700) Pulse Rate:  [80-115] 89 (04/17 0800) Resp:  [13-29] 29 (04/17 0800) BP: (117-162)/(57-89) 140/89 (04/17 0800) SpO2:  [81 %-98 %] 92 % (04/17 0800) Last BM Date: 03/27/17  Intake/Output from previous day: 04/16 0701 - 04/17 0700 In: 8830 [P.O.:1280; I.V.:6300; IV Piggyback:1000] Out: 1610 [Urine:1135; Stool:50; Blood:600] Intake/Output this shift: Total I/O In: 300 [I.V.:300] Out: 150 [Urine:150]  General appearance: cooperative Resp: clear to auscultation bilaterally Cardio: regular rate and rhythm GI: distended, soft, ostomy pink and edematous, min output, +BS  Lab Results:   Recent Labs  03/28/17 1329 03/29/17 0510  WBC 15.5* 11.4*  HGB 10.8* 10.2*  HCT 32.4* 31.4*  PLT 332 333   BMET  Recent Labs  03/28/17 1329 03/29/17 0510  NA 142 138  K 4.5 4.9  CL 109 107  CO2 22 25  GLUCOSE 190* 161*  BUN 14 13  CREATININE 1.33* 1.65*  CALCIUM 8.1* 7.8*   PT/INR No results for input(s): LABPROT, INR in the last 72 hours. ABG No results for input(s): PHART, HCO3 in the last 72 hours.  Invalid input(s): PCO2, PO2  Studies/Results: Dg Abd Portable 1v  Result Date: 03/27/2017 CLINICAL DATA:  Check nasogastric catheter placement EXAM: PORTABLE ABDOMEN - 1 VIEW COMPARISON:  03/25/2017 FINDINGS: Nasogastric catheter is been advanced and now lies coiled within the stomach. Scattered large and small bowel gas is noted. Persistent small bowel obstructive changes are seen. IMPRESSION: Nasogastric catheter now coiled within the stomach. Persistent small bowel dilatation is seen. Electronically Signed   By: Inez Catalina M.D.   On: 03/27/2017 16:55    Anti-infectives: Anti-infectives    Start     Dose/Rate Route Frequency Ordered Stop   03/28/17 0600  cefoTEtan (CEFOTAN) 2 g in dextrose 5 % 50 mL  IVPB     2 g 100 mL/hr over 30 Minutes Intravenous On call to O.R. 03/27/17 1423 03/28/17 0818      Assessment/Plan: s/p Procedure(s) with comments: PARTIAL COLECTOMY/COLOSTOMY (N/A) - Partial Colectomy, Mobilization of Splenic Flexure, Colostomy NEPHRECTOMY TOTAL (Left) POD#1 To floor Creatinine rise is not AKI it is due to nephrectomy - continue IVF PT/OT Path P  LOS: 3 days    Shivaay Stormont E 03/29/2017

## 2017-03-29 NOTE — Care Management Note (Addendum)
Case Management Note  Patient Details  Name: Shawn Chambers MRN: [id number] Date of Birth: [birth year]  Subjective/Objective:    Pt admitted with abd pain and constipation                Action/Plan:  PTA from home.  Pt is now s/p partial colectomy and colostomy.  PT/OT eval requested.  CM will continue to follow for discharge needs   Expected Discharge Date:                  Expected Discharge Plan:     In-House Referral:     Discharge planning Services  CM Consult  Post Acute Care Choice:    Choice offered to:     DME Arranged:    DME Agency:     HH Arranged:    HH Agency:     Status of Service:     If discussed at [employer], dates discussed:    Additional Comments:  Maryclare Labrador, RN 03/29/2017, 1:32 PM

## 2017-03-29 NOTE — Progress Notes (Addendum)
Patient ID: Shawn Chambers, male   DOB: 08/29/1941, 76 y.o.   MRN: [id number]  1 Day Post-Op Subjective: Pt doing well.  Pain controlled.  HD stable overnight.  Objective: Vital signs in last 24 hours: Temp:  [97.9 F (36.6 C)-99.7 F (37.6 C)] 99.5 F (37.5 C) (04/17 0341) Pulse Rate:  [80-115] 94 (04/17 0341) Resp:  [13-29] 21 (04/17 0341) BP: (117-162)/(57-84) 117/57 (04/17 0341) SpO2:  [81 %-98 %] 98 % (04/17 0341)  Intake/Output from previous day: 04/16 0701 - 04/17 0700 In: 8830 [P.O.:1280; I.V.:6300; IV Piggyback:1000] Out: 4239 [Urine:1135; Stool:50; Blood:600] Intake/Output this shift: Total I/O In: 1890 [P.O.:240; I.V.:1650] Out: 560 [Urine:510; Stool:50]  Physical Exam:  General: Alert and oriented CV: RRR Lungs: Clear Abdomen: Soft, ND Incisions: Dressing intact Ext: NT, No erythema  Lab Results:  Recent Labs  03/28/17 0343 03/28/17 1329 03/29/17 0510  HGB 13.4 10.8* 10.2*  HCT 41.1 32.4* 31.4*   BMET  Recent Labs  03/28/17 1329 03/29/17 0510  NA 142 138  K 4.5 4.9  CL 109 107  CO2 22 25  GLUCOSE 190* 161*  BUN 14 13  CREATININE 1.33* 1.65*  CALCIUM 8.1* 7.8*     Studies/Results:   Assessment/Plan: POD # 1 s/p left radical nephrectomy with partial colectomy  - Pathology pending - Continue to monitor renal function.  Rise in Cr expected and should stabilize - Will continue to follow   LOS: 3 days   Annsley Akkerman,LES 03/29/2017, 6:56 AM

## 2017-03-30 LAB — CBC
HEMATOCRIT: 29.7 % — AB (ref 39.0–52.0)
HEMOGLOBIN: 9.4 g/dL — AB (ref 13.0–17.0)
MCH: 27.8 pg (ref 26.0–34.0)
MCHC: 31.6 g/dL (ref 30.0–36.0)
MCV: 87.9 fL (ref 78.0–100.0)
Platelets: 287 10*3/uL (ref 150–400)
RBC: 3.38 MIL/uL — AB (ref 4.22–5.81)
RDW: 13.9 % (ref 11.5–15.5)
WBC: 10.3 10*3/uL (ref 4.0–10.5)

## 2017-03-30 LAB — BASIC METABOLIC PANEL
Anion gap: 6 (ref 5–15)
BUN: 16 mg/dL (ref 6–20)
CHLORIDE: 106 mmol/L (ref 101–111)
CO2: 22 mmol/L (ref 22–32)
CREATININE: 1.65 mg/dL — AB (ref 0.61–1.24)
Calcium: 7.8 mg/dL — ABNORMAL LOW (ref 8.9–10.3)
GFR calc non Af Amer: 39 mL/min — ABNORMAL LOW (ref >60)
GFR, EST AFRICAN AMERICAN: 45 mL/min — AB (ref >60)
Glucose, Bld: 123 mg/dL — ABNORMAL HIGH (ref 65–99)
POTASSIUM: 4.6 mmol/L (ref 3.5–5.1)
SODIUM: 134 mmol/L — AB (ref 135–145)

## 2017-03-30 LAB — GLUCOSE, CAPILLARY
GLUCOSE-CAPILLARY: 102 mg/dL — AB (ref 65–99)
GLUCOSE-CAPILLARY: 118 mg/dL — AB (ref 65–99)
GLUCOSE-CAPILLARY: 131 mg/dL — AB (ref 65–99)
GLUCOSE-CAPILLARY: 134 mg/dL — AB (ref 65–99)
GLUCOSE-CAPILLARY: 138 mg/dL — AB (ref 65–99)
Glucose-Capillary: 99 mg/dL (ref 65–99)

## 2017-03-30 MED ORDER — MORPHINE SULFATE (PF) 2 MG/ML IV SOLN
2.0000 mg | INTRAVENOUS | Status: DC | PRN
  Administered 2017-03-30 – 2017-03-31 (×4): 2 mg via INTRAVENOUS
  Filled 2017-03-30 (×5): qty 1

## 2017-03-30 MED ORDER — DOCUSATE SODIUM 100 MG PO CAPS
100.0000 mg | ORAL_CAPSULE | Freq: Two times a day (BID) | ORAL | Status: DC
  Administered 2017-03-30 – 2017-03-31 (×4): 100 mg via ORAL
  Filled 2017-03-30 (×4): qty 1

## 2017-03-30 MED ORDER — SODIUM CHLORIDE 0.9 % IV SOLN
INTRAVENOUS | Status: DC
  Administered 2017-03-30 – 2017-04-01 (×6): via INTRAVENOUS

## 2017-03-30 MED ORDER — ACETAMINOPHEN 325 MG PO TABS: 650.0000 mg | ORAL_TABLET | Freq: Four times a day (QID) | ORAL | Status: DC | PRN

## 2017-03-30 NOTE — Care Management Important Message (Signed)
Important Message  Patient Details  Name: Shawn Chambers MRN: [id number] Date of Birth: 10/24/1941   Medicare Important Message Given:  Yes    Aneliz Carbary Montine Circle 03/30/2017, 12:38 PM

## 2017-03-30 NOTE — Progress Notes (Signed)
2 Days Post-Op  Subjective: Tolerating clears  Objective: Vital signs in last 24 hours: Temp:  [98.5 F (36.9 C)-100.7 F (38.2 C)] 98.5 F (36.9 C) (04/18 0410) Pulse Rate:  [87-100] 98 (04/18 0410) Resp:  [17-29] 19 (04/18 0410) BP: (116-140)/(63-89) 116/68 (04/18 0410) SpO2:  [91 %-97 %] 91 % (04/18 0410) Last BM Date: 03/26/17 (prior to surgery)  Intake/Output from previous day: 04/17 0701 - 04/18 0700 In: 4387.5 [P.O.:975; I.V.:3412.5] Out: 1530 [Urine:1450; Stool:80] Intake/Output this shift: Total I/O In: 2552.5 [P.O.:340; I.V.:2212.5] Out: 905 [Urine:825; Stool:80]  General appearance: cooperative Resp: clear to auscultation bilaterally Cardio: regular rate and rhythm GI: distended, soft, ostomy pink and edematous, min output, +BS  Lab Results:   Recent Labs  03/29/17 0510 03/30/17 0228  WBC 11.4* 10.3  HGB 10.2* 9.4*  HCT 31.4* 29.7*  PLT 333 287   BMET  Recent Labs  03/29/17 0510 03/30/17 0228  NA 138 134*  K 4.9 4.6  CL 107 106  CO2 25 22  GLUCOSE 161* 123*  BUN 13 16  CREATININE 1.65* 1.65*  CALCIUM 7.8* 7.8*   PT/INR No results for input(s): LABPROT, INR in the last 72 hours. ABG No results for input(s): PHART, HCO3 in the last 72 hours.  Invalid input(s): PCO2, PO2  Studies/Results: No results found.  Anti-infectives: Anti-infectives    Start     Dose/Rate Route Frequency Ordered Stop   03/28/17 0600  cefoTEtan (CEFOTAN) 2 g in dextrose 5 % 50 mL IVPB     2 g 100 mL/hr over 30 Minutes Intravenous On call to O.R. 03/27/17 1423 03/28/17 0818      Assessment/Plan: s/p Procedure(s) with comments: PARTIAL COLECTOMY/COLOSTOMY (N/A) - Partial Colectomy, Mobilization of Splenic Flexure, Colostomy NEPHRECTOMY TOTAL (Left) POD#2 Creatinine rise is not AKI it is due to nephrectomy - continue IVF- recheck labs in AM Continue clears until less distended/more ostomy output PT/OT Path P  LOS: 4 days    Clovis Riley 03/30/2017

## 2017-03-30 NOTE — Evaluation (Addendum)
Physical Therapy Evaluation Patient Details Name: Shawn Chambers MRN: [id number] DOB: Dec 29, 1940 Today's Date: 03/30/2017   History of Present Illness  76 y.o. male admitted to [facility] on 03/26/17 due to Abdominal pain and distention.  Pt found to have obstructing cold mass and kidney mass.  Pt s/p partial colectomy/colostomy and left total nephrectomy on 03/28/17.  Pt with significant PMHx of seizure and CA.    Clinical Impression  Pt was able to walk around the whole unit with RW, however, he could not do so without O2.  He was 84% EOB on RA.  He also desated to 88% on 3 L O2 [place] with DOE 2/4 during gait.  Pt will likely progress well enough that he will not need any therapy f/u at discharge.   PT to follow acutely for deficits listed below.       Follow Up Recommendations No PT follow up    Equipment Recommendations  Rolling walker with 5" wheels;Other (comment) (possibly home O2)    Recommendations for Other Services   NA    Precautions / Restrictions Precautions Precautions: Other (comment) Precaution Comments: abdominal surgery and ostomy bag Restrictions Weight Bearing Restrictions: No      Mobility  Bed Mobility Overal bed mobility: Needs Assistance Bed Mobility: Rolling;Sidelying to Sit Rolling: Min assist Sidelying to sit: Min assist;HOB elevated       General bed mobility comments: Min assist to roll to left side, verbal cues for log roll for comfort due to abdominal incision, assist needed at trunk to boost up to sitting from elevated HOB.  Pt using railing for leverage.   Transfers Overall transfer level: Needs assistance Equipment used: Rolling walker (2 wheeled) Transfers: Sit to/from Stand Sit to Stand: Min guard         General transfer comment: Min guard assist for safety verbal cues for safe hand placement.   Ambulation/Gait Ambulation/Gait assistance: Supervision Ambulation Distance (Feet): 515 Feet Assistive device: Rolling walker (2 wheeled) Gait  Pattern/deviations: Step-through pattern;Trunk flexed     General Gait Details: Verbal cues for upright posture. Pt needed to walk with 3 L O2 [place] and still had DOE 2/4 and O2 sats 88%      Balance Overall balance assessment: Needs assistance Sitting-balance support: Feet supported;No upper extremity supported Sitting balance-Leahy Scale: Fair     Standing balance support: Bilateral upper extremity supported Standing balance-Leahy Scale: Fair                               Pertinent Vitals/Pain Pain Assessment: Faces Faces Pain Scale: Hurts even more Pain Location: abdomen Pain Descriptors / Indicators: Grimacing;Guarding Pain Intervention(s): Limited activity within patient's tolerance;Monitored during session;Repositioned    Home Living Family/patient expects to be discharged to:: Private residence Living Arrangements: Alone Available Help at Discharge: Family;Available PRN/intermittently Type of Home: House Home Access: Stairs to enter Entrance Stairs-Rails: Right Entrance Stairs-Number of Steps: 3 Home Layout: One level        Prior Function Level of Independence: Independent         Comments: drives, does his own cooking and cleaning.         Extremity/Trunk Assessment   Upper Extremity Assessment Upper Extremity Assessment: Overall WFL for tasks assessed    Lower Extremity Assessment Lower Extremity Assessment: Generalized weakness    Cervical / Trunk Assessment Cervical / Trunk Assessment: Normal  Communication   Communication: HOH  Cognition Arousal/Alertness: Awake/alert Behavior During Therapy:  WFL for tasks assessed/performed Overall Cognitive Status: Within Functional Limits for tasks assessed                                        General Comments General comments (skin integrity, edema, etc.): Educated pt on bracing with pillow when coughing and log roll for comfort to get up OOB.     Exercises Other  Exercises Other Exercises: IS x 3 reps max 1,000 mL   Assessment/Plan    PT Assessment Patient needs continued PT services  PT Problem List Decreased strength;Decreased activity tolerance;Decreased knowledge of use of DME;Cardiopulmonary status limiting activity;Pain       PT Treatment Interventions DME instruction;Gait training;Stair training;Functional mobility training;Therapeutic activities;Therapeutic exercise;Balance training;Patient/family education    PT Goals (Current goals can be found in the Care Plan section)  Acute Rehab PT Goals Patient Stated Goal: to go home PT Goal Formulation: With patient Time For Goal Achievement: 04/13/17 Potential to Achieve Goals: Good    Frequency Min 3X/week    End of Session Equipment Utilized During Treatment: Gait belt;Oxygen Activity Tolerance: Patient limited by fatigue Patient left: in chair;with call bell/phone within reach   PT Visit Diagnosis: Muscle weakness (generalized) (M62.81);Other abnormalities of gait and mobility (R26.89) (decreased activity tolerance)    Time: 1722-1745 PT Time Calculation (min) (ACUTE ONLY): 23 min   Charges:   PT Evaluation $PT Eval Moderate Complexity: 1 Procedure         Lashia Niese B. Holly, [place], DPT [phone]   03/30/2017, 11:21 PM

## 2017-03-31 LAB — GLUCOSE, CAPILLARY
Glucose-Capillary: 104 mg/dL — ABNORMAL HIGH (ref 65–99)
Glucose-Capillary: 97 mg/dL (ref 65–99)
Glucose-Capillary: 134 mg/dL — ABNORMAL HIGH (ref 65–99)
Glucose-Capillary: 125 mg/dL — ABNORMAL HIGH (ref 65–99)
Glucose-Capillary: 122 mg/dL — ABNORMAL HIGH (ref 65–99)

## 2017-03-31 MED ORDER — PANTOPRAZOLE SODIUM 40 MG PO TBEC
40.0000 mg | DELAYED_RELEASE_TABLET | Freq: Every day | ORAL | Status: DC
  Administered 2017-03-31: 40 mg via ORAL
  Filled 2017-03-31: qty 1

## 2017-03-31 MED ORDER — ENOXAPARIN SODIUM 40 MG/0.4ML ~~LOC~~ SOLN
40.0000 mg | SUBCUTANEOUS | Status: DC
  Administered 2017-03-31: 40 mg via SUBCUTANEOUS
  Filled 2017-03-31: qty 0.4

## 2017-03-31 MED ORDER — HYDROCODONE-ACETAMINOPHEN 5-325 MG PO TABS
1.0000 | ORAL_TABLET | ORAL | Status: DC | PRN
  Administered 2017-03-31: 1 via ORAL
  Administered 2017-04-01 (×2): 2 via ORAL
  Filled 2017-03-31: qty 1
  Filled 2017-03-31 (×2): qty 2

## 2017-03-31 MED ORDER — MORPHINE SULFATE (PF) 2 MG/ML IV SOLN
2.0000 mg | INTRAVENOUS | Status: DC | PRN
  Administered 2017-04-01: 2 mg via INTRAVENOUS
  Filled 2017-03-31: qty 1

## 2017-03-31 NOTE — Care Management Note (Signed)
Case Management Note  Patient Details  Name: Shawn Chambers MRN: [id number] Date of Birth: 1941-07-25  Subjective/Objective:                    Action/Plan:  Patient has new colostomy , will need HHRN . Spoke to patient at bedside. At discharge patient plans to stay with his daughter, her address is [address] , [place] . Patient does not have cell phone. Provided patient with list of home health agencies. Daughter planning on visiting this afternoon between 3 and 4 pm will revisit this afternoon per patient request. Expected Discharge Date:                  Expected Discharge Plan:  [place]  In-House Referral:     Discharge planning Services  CM Consult  Post Acute Care Choice:  Home Health Choice offered to:  Patient  DME Arranged:    DME Agency:     HH Arranged:    [place] Agency:     Status of Service:  In process, will continue to follow  If discussed at Long Length of Stay Meetings, dates discussed:    Additional Comments:  Marilu Favre, RN 03/31/2017, 2:22 PM

## 2017-03-31 NOTE — Progress Notes (Signed)
Patient ID: Shawn Chambers, male   DOB: 11/02/1941, 76 y.o.   MRN: [id number]  3 Days Post-Op Subjective: Pt doing well.  Pain controlled. Ambulating.  Having colostomy output.  Objective: Vital signs in last 24 hours: Temp:  [98.1 F (36.7 C)-98.9 F (37.2 C)] 98.1 F (36.7 C) (04/19 0457) Pulse Rate:  [89-94] 89 (04/19 0457) Resp:  [17-18] 17 (04/19 0457) BP: (109-130)/(59-72) 130/72 (04/19 0457) SpO2:  [84 %-98 %] 98 % (04/19 0457)  Intake/Output from previous day: 04/18 0701 - 04/19 0700 In: 2793.8 [I.V.:2793.8] Out: 1400 [Urine:300; Stool:1100] Intake/Output this shift: No intake/output data recorded.  Physical Exam:  General: Alert and oriented Abdomen: Soft, ND Incisions: Dressing intact Ext: NT, No erythema  Lab Results:  Recent Labs  03/28/17 1329 03/29/17 0510 03/30/17 0228  HGB 10.8* 10.2* 9.4*  HCT 32.4* 31.4* 29.7*   BMET  Recent Labs  03/29/17 0510 03/30/17 0228  NA 138 134*  K 4.9 4.6  CL 107 106  CO2 25 22  GLUCOSE 161* 123*  BUN 13 16  CREATININE 1.65* 1.65*  CALCIUM 7.8* 7.8*     Studies/Results: No results found.  Assessment/Plan: POD # 3 s/p left radical nephrectomy and partial colectomy - Pathology pending - Renal function stabilized as expected s/p nephrectomy.  Likely to improve with time.  Will recheck this as outpatient. - Ok to remove Foley from urologic perspective - Will continue to follow but ok for discharge from urologic perspective at anytime.  I will arrange appropriate outpatient urologic follow up.   LOS: 5 days   Ita Fritzsche,LES 03/31/2017, 7:25 AM

## 2017-03-31 NOTE — Progress Notes (Signed)
[facility] Progress Note  3 Days Post-Op  Subjective: CC: abdominal pain  Intermittent suprapubic pain. No nausea, vomiting or fevers. Tolerating clears.   Objective: Vital signs in last 24 hours: Temp:  [98.1 F (36.7 C)-98.9 F (37.2 C)] 98.5 F (36.9 C) (04/19 0943) Pulse Rate:  [89-94] 91 (04/19 0943) Resp:  [16-18] 16 (04/19 0943) BP: (111-130)/(59-72) 120/64 (04/19 0943) SpO2:  [84 %-98 %] 96 % (04/19 0943) Last BM Date: 03/26/17  Intake/Output from previous day: 04/18 0701 - 04/19 0700 In: 2793.8 [I.V.:2793.8] Out: 1400 [Urine:300; Stool:1100] Intake/Output this shift: Total I/O In: -  Out: 560 [Urine:560]  PE: Gen:  Alert, NAD, pleasant, cooperative, well appearing  Card:  RRR, no M/G/R heard Pulm:  CTA, no W/R/R, effort normal Abd: Soft, mildly distended, +BS, incision with honeycomb in place, ostomy pink with liquid brown stool and small amount of gas in bag, mild TTP in lower abdomen Skin: no rashes noted, warm and dry  Lab Results:   Recent Labs  03/29/17 0510 03/30/17 0228  WBC 11.4* 10.3  HGB 10.2* 9.4*  HCT 31.4* 29.7*  PLT 333 287   BMET  Recent Labs  03/29/17 0510 03/30/17 0228  NA 138 134*  K 4.9 4.6  CL 107 106  CO2 25 22  GLUCOSE 161* 123*  BUN 13 16  CREATININE 1.65* 1.65*  CALCIUM 7.8* 7.8*   PT/INR No results for input(s): LABPROT, INR in the last 72 hours. CMP     Component Value Date/Time   NA 134 (L) 03/30/2017 0228   K 4.6 03/30/2017 0228   CL 106 03/30/2017 0228   CO2 22 03/30/2017 0228   GLUCOSE 123 (H) 03/30/2017 0228   BUN 16 03/30/2017 0228   CREATININE 1.65 (H) 03/30/2017 0228   CALCIUM 7.8 (L) 03/30/2017 0228   PROT 6.5 03/28/2017 0343   ALBUMIN 2.9 (L) 03/28/2017 0343   AST 18 03/28/2017 0343   ALT 17 03/28/2017 0343   ALKPHOS 55 03/28/2017 0343   BILITOT 0.4 03/28/2017 0343   GFRNONAA 39 (L) 03/30/2017 0228   GFRAA 45 (L) 03/30/2017 0228   Lipase     Component Value Date/Time   LIPASE 23 03/26/2017 1127       Studies/Results: No results found.  Anti-infectives: Anti-infectives    Start     Dose/Rate Route Frequency Ordered Stop   03/28/17 0600  cefoTEtan (CEFOTAN) 2 g in dextrose 5 % 50 mL IVPB     2 g 100 mL/hr over 30 Minutes Intravenous On call to O.R. 03/27/17 1423 03/28/17 0818       Assessment/Plan s/p Procedure(s) with comments: PARTIAL COLECTOMY/COLOSTOMY (N/A) - Partial Colectomy, Mobilization of Splenic Flexure, Colostomy NEPHRECTOMY TOTAL (Left) - POD#3 - Creatinine rise is not AKI it is due to nephrectomy - continue IVF- recheck labs in AM - PT/OT - Path Pending - Urology states ok to remove foley, ok for discharge and they will arrange outpt f/u  FEN: soft diet VTE: lovenox ID: pre-op  Plan: PO pain meds, discharge likely tomorrow   LOS: 5 days    Kalman Drape , [facility] 03/31/2017, 11:02 AM Pager: [phone] Consults: [phone] Mon-Fri 7:00 am-4:30 pm Sat-Sun 7:00 am-11:30 am

## 2017-04-01 LAB — CBC
HCT: 28.3 % — ABNORMAL LOW (ref 39.0–52.0)
HEMOGLOBIN: 9.2 g/dL — AB (ref 13.0–17.0)
MCH: 28 pg (ref 26.0–34.0)
MCHC: 32.5 g/dL (ref 30.0–36.0)
MCV: 86 fL (ref 78.0–100.0)
Platelets: 343 10*3/uL (ref 150–400)
RBC: 3.29 MIL/uL — AB (ref 4.22–5.81)
RDW: 13.7 % (ref 11.5–15.5)
WBC: 7.6 10*3/uL (ref 4.0–10.5)

## 2017-04-01 LAB — BASIC METABOLIC PANEL
ANION GAP: 9 (ref 5–15)
BUN: 17 mg/dL (ref 6–20)
CALCIUM: 8.1 mg/dL — AB (ref 8.9–10.3)
CO2: 21 mmol/L — ABNORMAL LOW (ref 22–32)
Chloride: 106 mmol/L (ref 101–111)
Creatinine, Ser: 1.48 mg/dL — ABNORMAL HIGH (ref 0.61–1.24)
GFR, EST AFRICAN AMERICAN: 51 mL/min — AB (ref >60)
GFR, EST NON AFRICAN AMERICAN: 44 mL/min — AB (ref >60)
Glucose, Bld: 125 mg/dL — ABNORMAL HIGH (ref 65–99)
Potassium: 3.7 mmol/L (ref 3.5–5.1)
Sodium: 136 mmol/L (ref 135–145)

## 2017-04-01 LAB — GLUCOSE, CAPILLARY
GLUCOSE-CAPILLARY: 108 mg/dL — AB (ref 65–99)
GLUCOSE-CAPILLARY: 109 mg/dL — AB (ref 65–99)
GLUCOSE-CAPILLARY: 125 mg/dL — AB (ref 65–99)

## 2017-04-01 MED ORDER — DOCUSATE SODIUM 100 MG PO CAPS: 100.0000 mg | ORAL_CAPSULE | Freq: Two times a day (BID) | ORAL | 0 refills | Status: DC

## 2017-04-01 MED ORDER — HYDROCODONE-ACETAMINOPHEN 5-325 MG PO TABS: 1.0000 | ORAL_TABLET | ORAL | 0 refills | Status: DC | PRN

## 2017-04-01 NOTE — Progress Notes (Signed)
PT Cancellation Note  Patient Details Name: Shawn Chambers MRN: [id number] DOB: [birth year]   Cancelled Treatment:    Reason Eval/Treat Not Completed: Patient declined, no reason specified Pt in bed resting and daughter present. Pt decliner mobility and reported ambulating with daughter earlier today. PT will continue to follow acutely.    Salina April, PTA Pager: [phone]   04/01/2017, 2:17 PM

## 2017-04-01 NOTE — Discharge Instructions (Signed)
[facility]      [facility] [phone]  OPEN ABDOMINAL SURGERY: POST OP INSTRUCTIONS  Always review your discharge instruction sheet given to you by the facility where your surgery was performed.  IF YOU HAVE DISABILITY OR FAMILY LEAVE FORMS, YOU MUST BRING THEM TO THE OFFICE FOR PROCESSING.  PLEASE DO NOT GIVE THEM TO YOUR DOCTOR.  1. A prescription for pain medication may be given to you upon discharge.  Take your pain medication as prescribed, if needed.  If narcotic pain medicine is not needed, then you may take acetaminophen (Tylenol) or ibuprofen (Advil) as needed. 2. Take your usually prescribed medications unless otherwise directed. 3. If you need a refill on your pain medication, please contact your pharmacy. They will contact our office to request authorization.  Prescriptions will not be filled after 5pm or on week-ends. 4. You should follow a light diet the first few days after arrival home, such as soup and crackers, pudding, etc.unless your doctor has advised otherwise. A high-fiber, low fat diet can be resumed as tolerated.   Be sure to include lots of fluids daily. Most patients will experience some swelling and bruising on the chest and neck area.  Ice packs will help.  Swelling and bruising can take several days to resolve 5. Most patients will experience some swelling and bruising in the area of the incision. Ice pack will help. Swelling and bruising can take several days to resolve..  6. It is common to experience some constipation if taking pain medication after surgery.  Increasing fluid intake and taking a stool softener will usually help or prevent this problem from occurring.  A mild laxative (Milk of Magnesia or Miralax) should be taken according to package directions if there are no bowel movements after 48 hours. 7.  You may have steri-strips (small skin tapes) in place directly over the incision.  These strips should be left on the skin for 7-10 days.  If your  surgeon used skin glue on the incision, you may shower in 24 hours.  The glue will flake off over the next 2-3 weeks.  Any sutures or staples will be removed at the office during your follow-up visit. You may find that a light gauze bandage over your incision may keep your staples from being rubbed or pulled. You may shower and replace the bandage daily. 8. ACTIVITIES:  You may resume regular (light) daily activities beginning the next day--such as daily self-care, walking, climbing stairs--gradually increasing activities as tolerated.  You may have sexual intercourse when it is comfortable.  Refrain from any heavy lifting or straining until approved by your doctor. a. You may drive when you no longer are taking prescription pain medication, you can comfortably wear a seatbelt, and you can safely maneuver your car and apply brakes 9. You should see your doctor in the office for a follow-up appointment approximately two weeks after your surgery.  Make sure that you call for this appointment within a day or two after you arrive home to insure a convenient appointment time. OTHER INSTRUCTIONS:  _____________________________________________________________ _____________________________________________________________  WHEN TO CALL YOUR DOCTOR: 1. Fever over 101.0 2. Inability to urinate 3. Nausea and/or vomiting 4. Extreme swelling or bruising 5. Continued bleeding from incision. 6. Increased pain, redness, or drainage from the incision. 7. Difficulty swallowing or breathing 8. Muscle cramping or spasms. 9. Numbness or tingling in hands or feet or around lips.  The clinic staff is available to answer your questions during regular   business hours.  Please dont hesitate to call and ask to speak to one of the nurses if you have concerns.  For further questions, please visit [email or web address]    Colostomy, Adult, Care After Refer to this sheet in the next few weeks. These instructions  provide you with information about caring for yourself after your procedure. Your health care provider may also give you more specific instructions. Your treatment has been planned according to current medical practices, but problems sometimes occur. Call your health care provider if you have any problems or questions after your procedure. What can I expect after the procedure? After the procedure, it is common to have:  Swelling at the opening that was created during the procedure (stoma).  Slight bleeding around the stoma.  Redness around the stoma. Follow these instructions at home: Activity   Rest as needed while the stoma area heals.  Return to your normal activities as told by your health care provider. Ask your health care provider what activities are safe for you.  Avoid strenuous activity and abdominal exercises for 3 weeks or for as long as told by your health care provider.  Do not lift anything that is heavier than 10 lb (4.5 kg). Incision care    Follow instructions from your health care provider about how to take care of your incision. Make sure you:  Wash your hands with soap and water before you change your bandage (dressing). If soap and water are not available, use hand sanitizer.  Change your dressing as told by your health care provider.  Leave stitches (sutures), skin glue, or adhesive strips in place. These skin closures may need to stay in place for 2 weeks or longer. If adhesive strip edges start to loosen and curl up, you may trim the loose edges. Do not remove adhesive strips completely unless your health care provider tells you to do that. Stoma Care   Keep the stoma area clean.  Clean and dry the skin around the stoma each time you change the colostomy bag. To clean the stoma area:  Use warm water and only use cleansers that are recommended by your health care provider.  Rinse the stoma area with plain water.  Dry the area well.  Use stoma powder  or ointment on your skin only as told by your health care provider. Do not use any other powders, gels, wipes, or creams on your skin.  Check the stoma area every day for signs of infection. Check for:  More redness, swelling, or pain.  More fluid or blood.  Pus or warmth.  Measure the stoma opening regularly and record the size. Watch for changes. Share this information with your health care provider. Bathing   Do not take baths, swim, or use a hot tub until your health care provider approves. Ask your health care provider if you can take showers. You may be able to shower with or without the colostomy bag in place. If you bathe with the bag on, dry the bag afterward.  Avoid using harsh or oily soaps when you bathe. Colostomy Bag Care   Follow instructions from your health care provider about how to empty or change the colostomy bag.  Keep colostomy supplies with you at all times.  Store all supplies in a cool, dry place.  Empty the colostomy bag:  Whenever it is one-third to one-half full.  At bedtime.  Replace the bag every 2-4 days or as told by your health care provider. Driving  Do not drive for 24 hours if you received a sedative.  Do not drive or operate heavy machinery while taking prescription pain medicine. General instructions   Follow instructions from your health care provider about eating or drinking restrictions.  Take over-the-counter and prescription medicines only as told by your health care provider.  Avoid wearing clothes that are tight directly over your stoma.  Do not use any tobacco products, such as cigarettes, chewing tobacco, and e-cigarettes. If you need help quitting, ask your health care provider.  (Women) Ask your health care provider about becoming pregnant and about using birth control. Medicines may not be absorbed normally after the procedure.  Keep all follow-up visits as told by your health care provider. This is  important. Contact a health care provider if:  You are having trouble caring for your stoma or changing the colostomy bag.  You feel nauseous or you vomit.  You have a fever.  You havemore redness, swelling, or pain at the site of your stoma or around your anus.  You have more fluid or blood coming from your stoma or your anus.  Your stoma area feels warm to the touch.  You have pus coming from your stoma.  You notice a change in the size or appearance of the stoma.  You have abdominal pain, bloating, pressure, or cramping.  Your have stool more often or less often than your health care provider tells you to expect.  You are not making much urine. This may be a sign of dehydration. Get help right away if:  Your abdominal pain does not go away or it becomes severe.  You keep vomiting.  Your stool is not draining through the stoma.  You have chest pain or an irregular heartbeat. This information is not intended to replace advice given to you by your health care provider. Make sure you discuss any questions you have with your health care provider. Document Released: 04/21/2011 Document Revised: 04/08/2016 Document Reviewed: 08/12/2015 Elsevier Interactive Patient Education  2017 [employer].

## 2017-04-01 NOTE — Progress Notes (Signed)
4 Days Post-Op  Subjective: CC up in chair eating, hoping to go home  Objective: Vital signs in last 24 hours: Temp:  [97.5 F (36.4 C)-98.9 F (37.2 C)] 97.5 F (36.4 C) (04/20 0511) Pulse Rate:  [85-96] 91 (04/20 0511) Resp:  [16-17] 17 (04/20 0511) BP: (116-131)/(61-67) 120/61 (04/20 0511) SpO2:  [92 %-96 %] 93 % (04/20 0511) Last BM Date: 03/31/17  Intake/Output from previous day: 04/19 0701 - 04/20 0700 In: 3834.2 [P.O.:780; I.V.:3054.2] Out: 1985 [Urine:560; JQBHA:1937] Intake/Output this shift: No intake/output data recorded.  General appearance: cooperative Resp: clear to auscultation bilaterally GI: soft, stoma pink with output, dry stain on dressing  Lab Results:   Recent Labs  03/30/17 0228 04/01/17 0523  WBC 10.3 7.6  HGB 9.4* 9.2*  HCT 29.7* 28.3*  PLT 287 343   BMET  Recent Labs  03/30/17 0228 04/01/17 0523  NA 134* 136  K 4.6 3.7  CL 106 106  CO2 22 21*  GLUCOSE 123* 125*  BUN 16 17  CREATININE 1.65* 1.48*  CALCIUM 7.8* 8.1*   PT/INR No results for input(s): LABPROT, INR in the last 72 hours. ABG No results for input(s): PHART, HCO3 in the last 72 hours.  Invalid input(s): PCO2, PO2  Studies/Results: No results found.  Anti-infectives: Anti-infectives    Start     Dose/Rate Route Frequency Ordered Stop   03/28/17 0600  cefoTEtan (CEFOTAN) 2 g in dextrose 5 % 50 mL IVPB     2 g 100 mL/hr over 30 Minutes Intravenous On call to O.R. 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

## 2017-04-01 NOTE — Care Management Note (Signed)
Case Management Note  Patient Details  Name: Shawn Chambers MRN: [id number] Date of Birth: 25-Sep-1941  Subjective/Objective:                    Action/Plan:  Spoke to patient and his daughter Shawn Chambers at bedside.  Patient is discharging to his daughter Shawn Chambers's home: [place], [place] [address] [phone], home [phone] Expected Discharge Date:                  Expected Discharge Plan:  [place]  In-House Referral:     Discharge planning Services  CM Consult  Post Acute Care Choice:  Home Health Choice offered to:  Patient, Adult Children  DME Arranged:  Walker rolling DME Agency:  [place] Arranged:  RN [facility] Agency:  [place]  Status of Service:  Completed, signed off  If discussed at [place] of Stay Meetings, dates discussed:    Additional Comments:  Marilu Favre, RN 04/01/2017, 11:05 AM

## 2017-04-01 NOTE — Progress Notes (Signed)
Patient ID: Shawn Chambers, male   DOB: 05-19-1941, 76 y.o.   MRN: [id number]   Pathology still pending.  If patient is ready for discharge today, ok from a urologic perspective to discharge and I will call him with pathology result and arrange outpatient follow up.

## 2017-04-01 NOTE — Care Management Important Message (Signed)
Important Message  Patient Details  Name: Shawn Chambers MRN: [id number] Date of Birth: [birth year]   Medicare Important Message Given:  Yes    Timisha Mondry Montine Circle 04/01/2017, 3:17 PM

## 2017-04-01 NOTE — Progress Notes (Signed)
Patient ID: Shawn Chambers, male   DOB: [birth year], 76 y.o.   MRN: [id number] I reviewed his pathology with him. I also called his daughter per their request and reviewed the pathology as well as the dispo plan.  Georganna Skeans, MD, MPH, FACS Trauma: [phone] General Surgery: [phone]

## 2017-04-01 NOTE — Progress Notes (Signed)
Discharge home. Home discharge instruction given, no question verbalized. 

## 2017-04-01 NOTE — Consult Note (Signed)
[facility] Nurse ostomy consult note Stoma type/location: LLQ, end colostomy Stomal assessment/size: 2 1/2" round, budded, edematous Peristomal assessment: intact, MARSI (medical adhesive related skin injury) noted at 5-7 and 3-6 just past the tape border of the barrier.  Most likely from the wafer and abdominal edema post op Treatment options for stomal/peristomal skin: silicone foam to protect areas of MARSI Output liquid green, reported to be high volumes I emptied 400cc with the patient, bedside nurse reports they just emptied 400cc prior to my arrival Ostomy pouching: 1pc.pouch change today Education: patient receiving first post op educational visit today.  Patient reports he will not be doing his pouch change that his niece will perform "she is a Marine scientist".  He will be living with family and he reports "they will help me"  When questioned about learning to empty he again states "somebody will be there to do that".  I have insisted that he practice opening and closing pouches and he does this on existing pouch and new pouch.  He verbalized need to clean the spout out prior to closure which he learned from "watching them nurses". Left educational materials in the room, one pg handout with steps for pouch change for one pc., and I have marked an [employer] catelog with the pouch he is using.  Feel with the size of his abdomen that a one pc will be best to conform and will be easiest for his family. Enrolled patient in [place] program: Yes

## 2017-04-01 NOTE — Discharge Summary (Signed)
[facility] Discharge Summary   Patient ID: Shawn Chambers MRN: [id number] DOB/AGE: 06/28/1941 76 y.o.  Admit date: 03/26/2017 Discharge date: 04/01/2017  Admitting Diagnosis: Large bowel obstruction Colonic mass Left renal mass  Discharge Diagnosis Patient Active Problem List   Diagnosis Date Noted  . GERD (gastroesophageal reflux disease) 03/28/2017  . 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 S/P partial colectomy 03/28/2017  . Colon obstruction ([place]) 03/26/2017    Consultants Juanita Craver MD - GI Raynelle Bring MD - urology Imaging: CT abdomen pelvis w contrast 03/26/17: 1. 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 Differential would also include segmental colitis including ischemic colitis. The inflammation and fluid along the pericolic gutter would favor colitis. The partial obstructive pattern would favor colorectal carcinoma. No significant diverticular disease through this region. 2. Distention of the colon proximal to the segmental colonic lesion consistent with partial mechanical obstruction by the lesion. 3. No evidence of lymphadenopathy in the abdomen pelvis. 4. 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  Procedures Dr. Alinda Money (03/28/17) - Left radical nephrectomy Dr. Grandville Silos (03/28/17) - PARTIAL COLECTOMY/COLOSTOMY, MOBILIZATION OF SPLENIC FLEXURE, LEFT NEPHRECTOMY TOTAL  Hospital Course:  Shawn Chambers is a 76yo male who presented to [facility]/26[facility]17 with several weeks of gradually worsening abdominal distension.  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  Patient was admitted for further workup.  CEA found to be elevated at 29.3, and chest CT negative for  metastasis. On 4/16 patient underwent a left radical nephrectomy, partial colectomy and colostomy.  Tolerated procedure well and was transferred to the ICU for close monitoring.  On POD1 he was transferred to the floor and diet slowly advanced as tolerated.  Patient worked with PT during this admission. Foley catheter was removed on 4/19. On POD4 the patient was voiding well, tolerating diet, ambulating well, pain well controlled, vital signs stable, incisions c/d/i and felt stable for discharge home with home health.  Patient will follow up in our office in about 1 week for staple removal, and knows to call with questions or concerns.    I was not directly involved in the care of this patient, therefore the information in this discharge summary was taken from the chart.  I have personally reviewed the patients medication history on the [place] controlled substance database.    Allergies as of 04/01/2017   No Known Allergies     Medication List    TAKE these medications   aspirin 81 MG chewable tablet Chew 81 mg by mouth every other day.   [employer] ANTACID/ANTI-GAS 400-400-40 MG/5ML suspension Generic drug:  alum & mag hydroxide-simeth Take 30 mLs by mouth 3 (three) times daily as needed for nausea.   [employer] D3 1000 units capsule Generic drug:  Cholecalciferol Take 1,000 Units by mouth daily.   docusate sodium 100 MG capsule Commonly known as:  COLACE Take 1 capsule (100 mg total) by mouth 2 (two) times daily.   HYDROcodone-acetaminophen 5-325 MG tablet Commonly known as:  NORCO/VICODIN Take 1-2 tablets by mouth every 4 (four) hours as needed for moderate pain.   omeprazole 20 MG capsule Commonly known as:  PRILOSEC Take 20 mg by mouth 2 (two) times daily.  Durable Medical Equipment        Start     Ordered   03/31/17 1428  For home use only DME Walker rolling  Once    Question:  Patient needs a walker to treat with the following condition  Answer:  History of partial  colectomy   03/31/17 1428       Follow-up [place], MD Follow up.   Specialty:  Urology Why:  Office to call to schedule for 4-6 weeks to check renal function Contact information: [address] [place] [address] [phone]        Zenovia Jarred, MD. Go on 05/11/2017.   Specialty:  General Surgery Why:  Your appointment is 05/11/2017 at 9:10AM. Please arrive 15 minutes prior to your appointment to check in. Contact information: [address] [place] [address] [phone]        [facility], [place]. Go on 04/11/2017.   Specialty:  General Surgery Why:  Your appointment is 04/11/17 at 10:30AM with one of our nurses to have your staples removed. Please arrive 30 minutes prior to your appointment to check in and fill out necessary paperwork. Contact information: [place]       Betsy Coder, MD. Call.   Specialty:  Oncology Why:  We are working on your appointment, please call to confirm Contact information: [place] [address] [phone]           Signed: Jerrye Beavers, [facility] 04/01/2017, 12:59 PM Pager: [phone] Consults: [phone] Mon-Fri 7:00 am-4:30 pm Sat-Sun 7:00 am-11:30 am

## 2017-04-03 ENCOUNTER — Telehealth: Payer: Self-pay | Admitting: General Surgery

## 2017-04-03 NOTE — Telephone Encounter (Signed)
Shawn Chambers, his daughter in law called and stated he was not eating or drinking much, his feet were swollen and he had a white plaque on his tongue.  He was not voiding much.  I advised her to push oral fluids and call the office in the morning to have magic mouthwash prescription called into his pharmacy.  If he was not able to drink much, I advised her to take him to the ED.

## 2017-04-08 ENCOUNTER — Encounter: Payer: Self-pay | Admitting: Oncology

## 2017-04-08 ENCOUNTER — Telehealth: Payer: Self-pay | Admitting: Oncology

## 2017-04-08 NOTE — Telephone Encounter (Signed)
Attempted to call the pt to schedule an appt, no answer. Scheduled the pt to see Dr. Benay Spice on 5/10 at 2pm. Will mail a letter to the pt.

## 2017-04-21 ENCOUNTER — Ambulatory Visit: Payer: Medicare HMO | Admitting: Oncology

## 2017-04-27 ENCOUNTER — Telehealth: Payer: Self-pay | Admitting: Oncology

## 2017-04-27 NOTE — Telephone Encounter (Signed)
Spoke to [employer] at [employer] and made aware the pt's appt has been rescheduled.

## 2017-04-27 NOTE — Telephone Encounter (Signed)
Spoke to the pt's daughter in law to reschedule appt. She stated that they were unable to bring the pt on 5/10. Also says they are only able to bring the pt in on Monday or Tuesday. Appt has been r/s to 6/11 at 2pm. Will notify the referring office

## 2017-05-14 NOTE — Addendum Note (Signed)
Addendum  created 05/14/17 0819 by Nakhi Choi, MD   Sign clinical note    

## 2017-05-23 ENCOUNTER — Ambulatory Visit (HOSPITAL_BASED_OUTPATIENT_CLINIC_OR_DEPARTMENT_OTHER): Payer: Medicare HMO | Admitting: Oncology

## 2017-05-23 ENCOUNTER — Telehealth: Payer: Self-pay | Admitting: Oncology

## 2017-05-23 ENCOUNTER — Ambulatory Visit (HOSPITAL_BASED_OUTPATIENT_CLINIC_OR_DEPARTMENT_OTHER): Payer: Medicare HMO

## 2017-05-23 VITALS — BP 148/67 | HR 75 | Temp 98.8°F | Resp 18 | Ht 71.0 in | Wt 199.1 lb

## 2017-05-23 DIAGNOSIS — C186 Malignant neoplasm of descending colon: Secondary | ICD-10-CM

## 2017-05-23 DIAGNOSIS — D225 Melanocytic nevi of trunk: Secondary | ICD-10-CM | POA: Diagnosis not present

## 2017-05-23 DIAGNOSIS — D3002 Benign neoplasm of left kidney: Secondary | ICD-10-CM | POA: Diagnosis not present

## 2017-05-23 DIAGNOSIS — N400 Enlarged prostate without lower urinary tract symptoms: Secondary | ICD-10-CM

## 2017-05-23 DIAGNOSIS — R970 Elevated carcinoembryonic antigen [CEA]: Secondary | ICD-10-CM | POA: Diagnosis not present

## 2017-05-23 HISTORY — DX: Malignant neoplasm of descending colon: C18.6

## 2017-05-23 NOTE — Progress Notes (Signed)
[facility] New Patient Consult   Referring MD: Georganna Skeans, Md [place], [place] [id number]   Shawn Chambers 76 y.o.  [birth year]    Reason for Referral: Colon cancer   HPI: Shawn Chambers presented to the emergency room 03/26/2017 with abdominal distention and pain progressing over several weeks. A CT of the abdomen and pelvis revealed no focal hepatic lesion. A 2.6 cm enhancing lesion was noted in the left kidney. The cecum, ascending, and transverse colon were distended. Circumferential bowel wall thickening was noted at the splenic flexure extending over 12 cm. Pericolonic inflammation was noted. No perforation or abscess. No lymphadenopathy.  Dr. Grandville Silos was consulted and he was taken to the operating room by Drs. Grandville Silos and Borden on 03/28/2017 for a partial colectomy/colostomy, and left nephrectomy. A mass was noted at the splenic flexure of the colon with significant inflammatory changes and obstruction. The pathology ([id number]) revealed an invasive moderately differentiated adenocarcinoma extending into pericolonic soft tissue. The resection margins were negative. No lymphovascular or perineural invasion. No tumor deposits. An abscess was noted beneath the tumor on gross evaluation. Additional tubular adenomas were noted. No macroscopic tumor perforation. 29 lymph nodes revealed no evidence of carcinoma. The tumor returned MSI-stable with no loss of mismatch repair protein expression. 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 He missed a scheduled appointment last month and is now rescheduled for oncology evaluation.   Past Medical History:  Diagnosis Date  . Colon cancer, descending, T3 N0  03/28/2017   . GERD (gastroesophageal reflux disease)   . Seizures ([place])    resolved    .  BPH  Past Surgical History:  Procedure Laterality Date  . PARTIAL COLECTOMY N/A 03/28/2017   Procedure: PARTIAL  COLECTOMY/COLOSTOMY;  Surgeon: Georganna Skeans, MD;  Location: [facility];  Service: General;  Laterality: N/A;  Partial Colectomy, Mobilization of Splenic Flexure, Colostomy  . PARTIAL NEPHRECTOMY Left 03/28/2017   Procedure: NEPHRECTOMY TOTAL;  Surgeon: Raynelle Bring, MD;  Location: [facility];  Service: Urology;  Laterality: Left;    .  Hand surgery following a laceration in 1969   .  Appendectomy 1962  Medications: Reviewed  Allergies: No Known Allergies  Family history: His maternal grandmother had "leukemia". No other family history of cancer  Social History:   He lives in [place]. 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 He quit smoking cigarettes 2 months ago. No alcohol use.   ROS:   Positives include: Nocturia, burning with urination in the mornings, rectal bleeding prior to surgery  A complete ROS was otherwise negative.  Physical Exam:  Blood pressure (!) 148/67, pulse 75, temperature 98.8 F (37.1 C), temperature source Oral, resp. rate 18, height _0  (1.803 m), weight 199 lb 1.6 oz (90.3 kg), SpO2 98 %.  HEENT: Oropharynx without visible mass, neck without mass Lungs: End inspiratory rhonchi at the left posterior base, no respiratory distress Cardiac: Regular rate and rhythm Abdomen: No hepatosplenomegaly, no mass, nontender GU: Uncircumcised male, testes without mass  Vascular: No leg edema Lymph nodes: No cervical, supraclavicular, axillary, or inguinal nodes Neurologic: Alert and oriented, the motor exam appears intact in the upper and lower extremities Skin: No rash, irregular hyperpigmented mole at the right upper back Musculoskeletal: No spine tenderness   LAB: CEA on 03/26/2017-29.3   Imaging:     Assessment/Plan:   1. Colon cancer, descending colon, stage II (T3 N0), status post a partial left colectomy  03/28/2017  MSI-stable, no loss of mismatch repair protein expression  CT abdomen/pelvis 03/26/2017 and CT chest 03/27/2017-no evidence metastatic  disease, abdomen/pelvis CT-long segment of bowel wall thickening in the left colon, partial mechanical obstruction by the area of thickening in the left colon, solid enhancing left renal lesion  Elevated preoperative CEA  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   BPH  4.   History of tobacco use  5.   Irregular mole at the right upper back   Disposition:   Shawn Chambers has been diagnosed with stage II colon cancer. He underwent a partial left colectomy. I reviewed the details of the surgical pathology report and discussed the prognosis with Mr. Morissette and his daughter-in-law. He has a good prognosis for a long-term disease-free survival. There is no clear role for adjuvant systemic chemotherapy in this setting. The CEA was mildly elevated and he presented with a partial obstruction. There are no other "high risk "features with his tumor. I do not recommend adjuvant chemotherapy.  He does not appear to have hereditary non-polyposis colon cancer syndrome. However he understands family members are at increased risk of developing colon cancer and should receive appropriate screening.  He did not undergo a preoperative colonoscopy. I referred him for a colonoscopy prior to the colostomy reversal.  The CEA will be repeated today.  I recommended he follow-up with a primary physician or dermatologist to evaluate the mole at the right upper back. He will follow-up with urology for the BPH symptoms.  50 minutes were spent with the patient today. The majority of the time was used for counseling and coordination of care.  Donneta Romberg, MD  05/23/2017, 5:15 PM

## 2017-05-23 NOTE — Telephone Encounter (Signed)
Lab appointment added for today, per 05/23/17 los. Lab and follow up appointments scheduled, per 05/23/17 los. Patient was given a copy of the AVS report and appointment schedule, per 05/23/17 los.

## 2017-05-24 ENCOUNTER — Telehealth: Payer: Self-pay | Admitting: *Deleted

## 2017-05-24 LAB — CEA (IN HOUSE-CHCC): CEA (CHCC-IN HOUSE): 11.15 ng/mL — AB (ref 0.00–5.00)

## 2017-05-24 NOTE — Telephone Encounter (Signed)
Spoke with Caryl Pina, she reports they have scheduled pt with Dr. Rex Kras (PCP) to have mole evaluated/ removed. Will make Dr. Benay Spice aware.

## 2017-05-25 ENCOUNTER — Telehealth: Payer: Self-pay | Admitting: *Deleted

## 2017-05-25 DIAGNOSIS — C186 Malignant neoplasm of descending colon: Secondary | ICD-10-CM

## 2017-05-25 NOTE — Telephone Encounter (Signed)
-----   Message from Ladell Pier, MD sent at 05/24/2017  6:46 PM EDT ----- Please call patient, cea is still mildly elevated, repeat in 1 month, if still elevated with schedule CTs

## 2017-06-02 ENCOUNTER — Telehealth: Payer: Self-pay | Admitting: Oncology

## 2017-06-02 NOTE — Telephone Encounter (Signed)
lvm to inform pt of lab only appt 7/13 at 1130 per sch msg

## 2017-06-24 ENCOUNTER — Other Ambulatory Visit: Payer: Medicare HMO

## 2017-11-22 ENCOUNTER — Ambulatory Visit: Payer: Medicare HMO | Admitting: Oncology

## 2017-11-22 ENCOUNTER — Other Ambulatory Visit: Payer: Medicare HMO

## 2017-11-27 ENCOUNTER — Telehealth: Payer: Self-pay | Admitting: Oncology

## 2017-11-27 NOTE — Telephone Encounter (Signed)
Rescheduled missed appt from 12/11 - patient said they will have their daughter call.

## 2017-11-30 ENCOUNTER — Other Ambulatory Visit: Payer: Medicare HMO

## 2017-11-30 ENCOUNTER — Ambulatory Visit: Payer: Medicare HMO | Admitting: Oncology

## 2018-03-19 IMAGING — CT CT ABD-PELV W/ CM
2 of 5 series · 15 of 46 positions shown, 17 images · IV contrast (APPLIED)
Comparison: None.

CLINICAL DATA: Abdominal pain. No bowel movement. Mildly Elevated
white blood cell count.

EXAM:
CT ABDOMEN AND PELVIS WITH CONTRAST
TECHNIQUE: Multidetector CT imaging of the abdomen and pelvis was performed
using the standard protocol following bolus administration of
intravenous contrast.
CONTRAST:  100mL YMIJ95-IJJ IOPAMIDOL (YMIJ95-IJJ) INJECTION 61%

[Series 3: abd/ pelvis 5.0 i30f 2 · axial · 0.67mm/px · z∈[+786,+1236]mm · 12 of 102 slices shown, 14 images]
[im 6/102  soft-tissue]
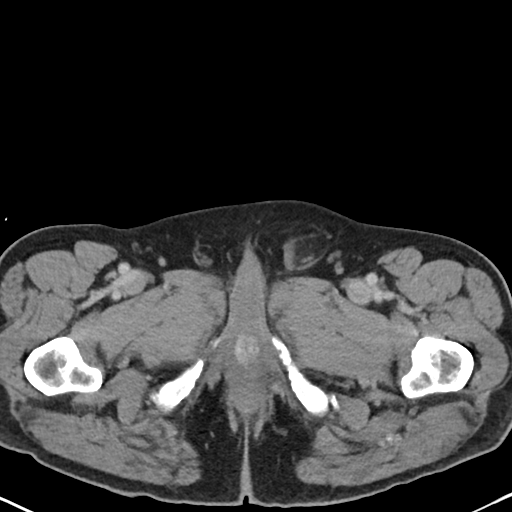
[im 6/102  bone]
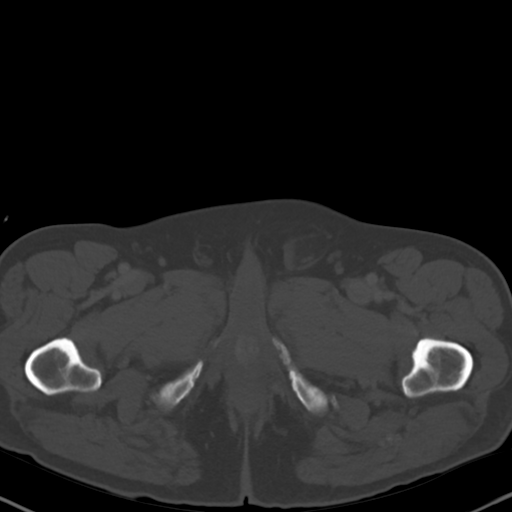
[im 16/102  soft-tissue]
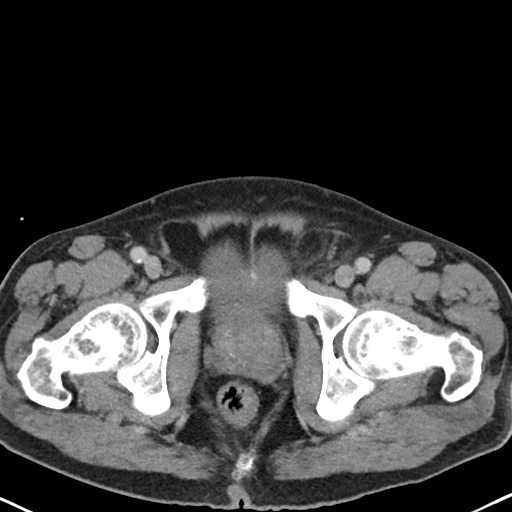
[im 22/102  soft-tissue]
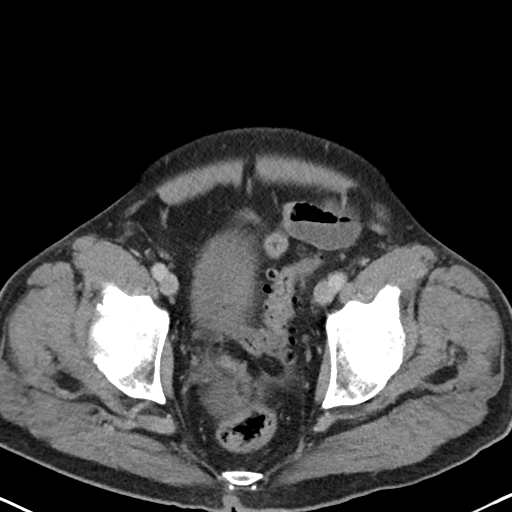
[im 32/102  soft-tissue]
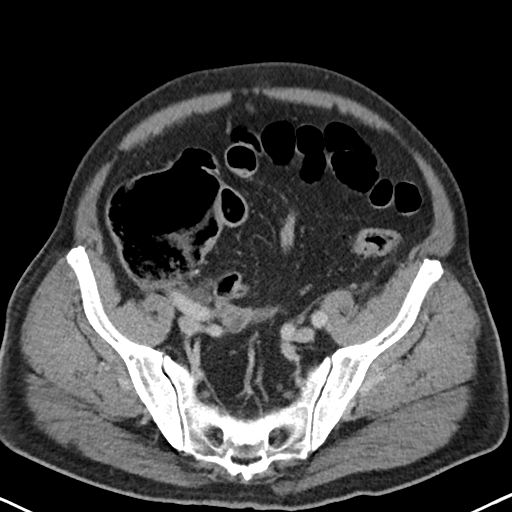
[im 38/102  soft-tissue]
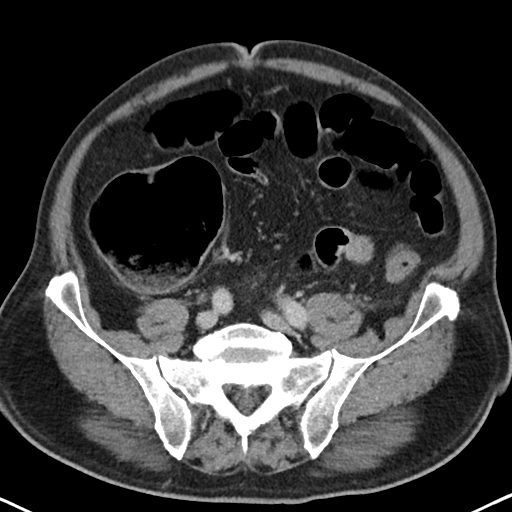
[im 48/102  soft-tissue]
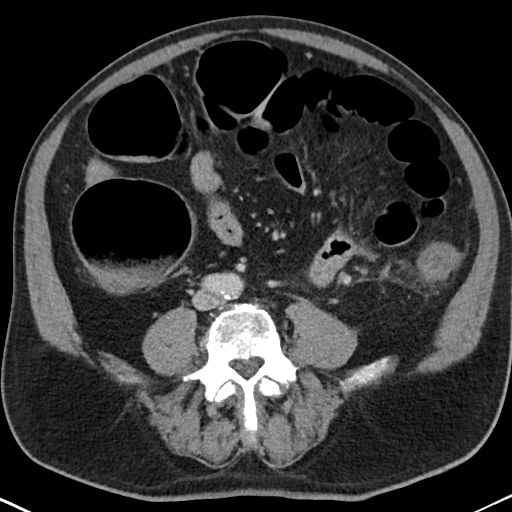
[im 54/102  soft-tissue]
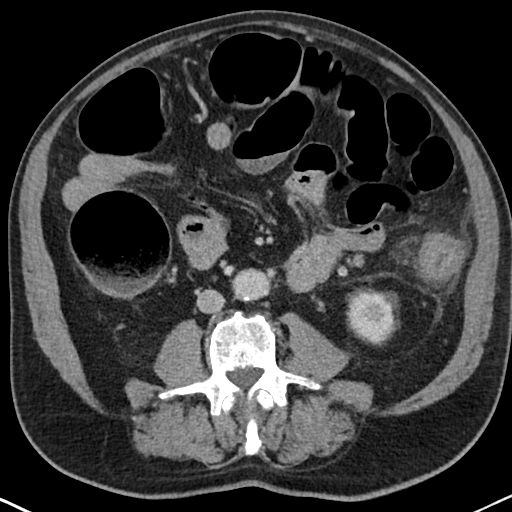
[im 64/102  soft-tissue]
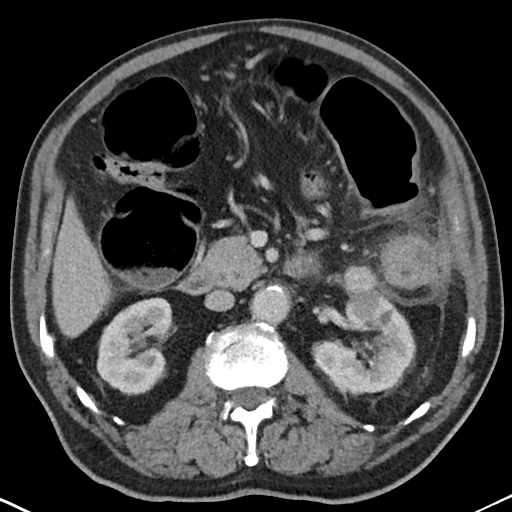
[im 70/102  soft-tissue]
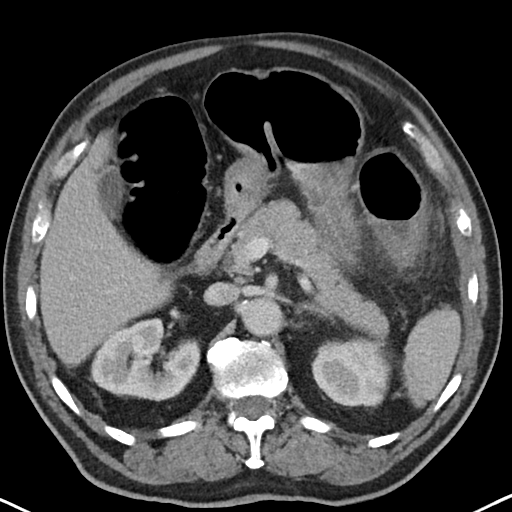
[im 70/102  bone]
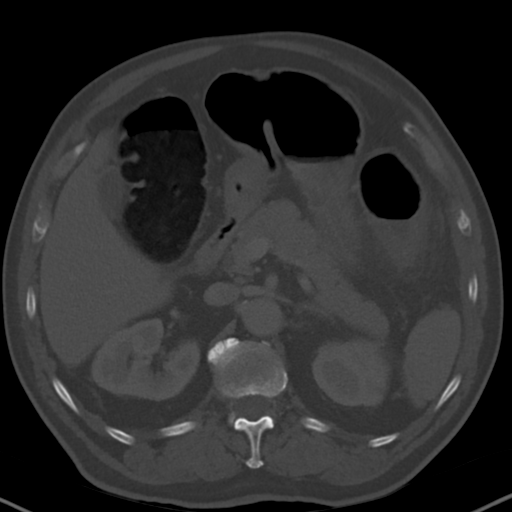
[im 80/102  soft-tissue]
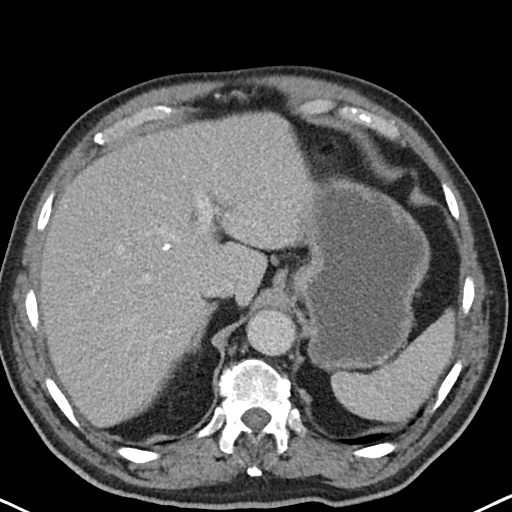
[im 86/102  soft-tissue]
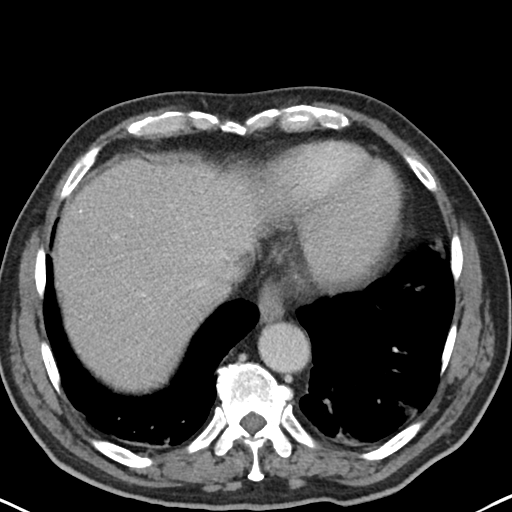
[im 96/102  soft-tissue]
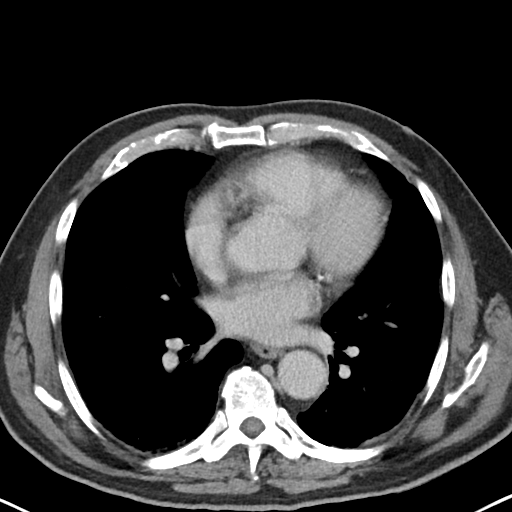

[Series 6: coronal soft tissue · coronal · 0.71mm/px · 3 of 101 slices shown]
[im 34/101  soft-tissue]
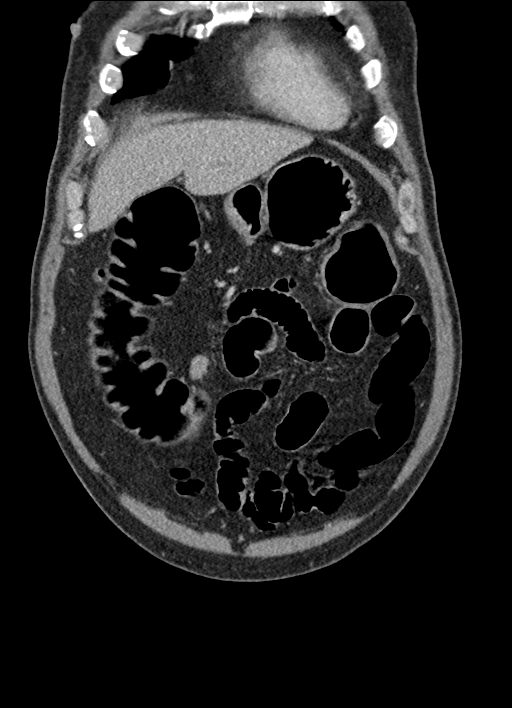
[im 45/101  soft-tissue]
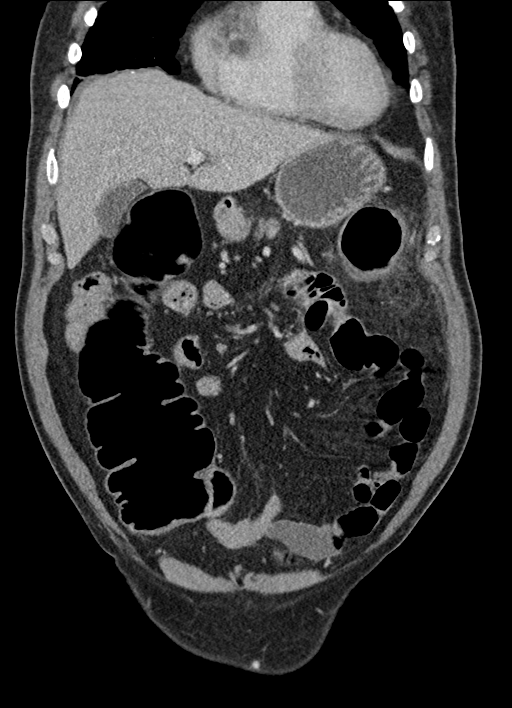
[im 56/101  soft-tissue]
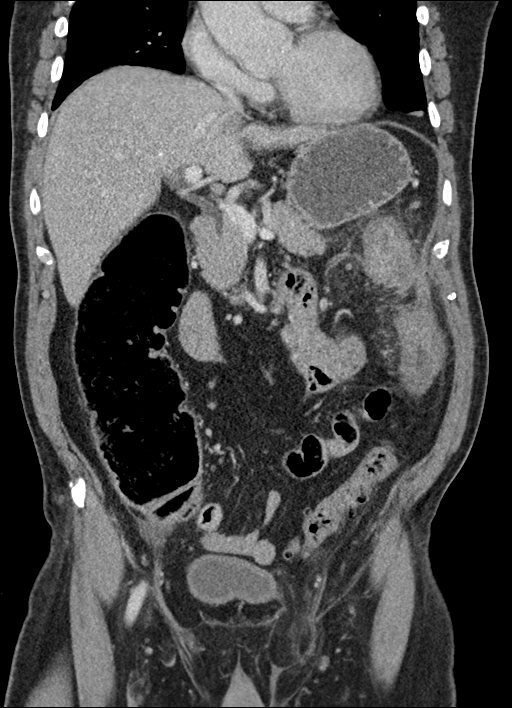

[15 of 46 positions shown; findings below may reference images not displayed]

FINDINGS: Lower chest: Lung bases are clear.

Hepatobiliary: No focal hepatic lesion. No biliary duct dilatation.
Gallbladder is normal. Common bile duct is normal.

Pancreas: Pancreas is normal. No ductal dilatation. No pancreatic
inflammation.

Spleen: Normal spleen

Adrenals/urinary tract: Adrenal glands normal.

Along the anterior cortex of theLEFT kidney is a 2.6 cm round
enhancing lesion (image 41, series 3). The ureters and bladder
normal.

Stomach/Bowel: Stomach, duodenum, small bowel are normal. Appendix
is not identified.

The cecum and ascending colon are gas-filled and distended. The
transverse colon is gas-filled and moderately distended. There is
long segment of circumferential bowel wall thickening to 1 cm and
luminal narrowing involving the splenic flexure of the colon
extending over approximately 12 cm segment (image 60, series 6).
There is pericolonic inflammation within the adjacent pericolonic
fat the mesenteric. There is fluid along the LEFT pericolic gutter
(image 43, series 3). There are no diverticulum through this region.
There are several diverticulum more distally in the descending colon
and sigmoid colon.

There is a region of low attenuation within the bowel wall along the
anti mesenteric border measuring 18 mm by 13 mm (image 40, series
3).

No perforation or abscess.

Distal to this segment of circumferential thickening and diffuse
bowel wall thickening the sigmoid colon is collapsed. The rectum is
collapsed.

Vascular/Lymphatic: Minimal intimal calcification of the abdominal
aorta. The celiac axis and SMA are widely patent. IMA is patent.

Reproductive: Prostate normal

Other: No free fluid.  Fat filled LEFT inguinal hernia.

Musculoskeletal: No aggressive osseous lesion.
IMPRESSION: 1. Long segment of circumferential bowel wall thickening and luminal
narrowing through the splenic flexure of the colon is concerning for
COLORECTAL CARCINOMA. Differential would also include segmental
colitis including ischemic colitis. The inflammation and fluid along
the pericolic gutter would favor colitis. The partial obstructive
pattern would favor colorectal carcinoma. No significant
diverticular disease through this region.
2. Distention of the colon proximal to the segmental colonic lesion
consistent with partial mechanical obstruction by the lesion.
3. No evidence of lymphadenopathy in the abdomen pelvis.
4. Solid enhancing LEFT renal lesion is highly concerning for a
incidental LEFT RENAL CELL CARCINOMA.

## 2018-04-03 ENCOUNTER — Ambulatory Visit: Payer: Self-pay | Admitting: Surgery

## 2018-04-03 NOTE — H&P (Signed)
CC: History of colon cancer at the splenic flexure status post resection and left nephrectomy for incidentally found left kidney tumor. Here for follow-up to discuss possible colostomy reversal  HPI: Shawn Chambers is a pleasant 77 year old gentleman with a history of splenic flexure mass status post resection with long Hartmann's by Dr. Grandville Chambers and left nephrectomy by Dr. Alinda Chambers for incidentally found left renal mass. Pathology returned on the colon. T3 N0 M0; left kidney came back as an oncocytoma. He presented to the emergency room for/2018 with abdominal pain and discomfort. Workup was significant for a CAT scan showing long segment circumflex marginal bowel wall thickening and luminal narrowing through the splenic flexure concerning for cancer. Additionally, there is a left renal mass noted.Marland Kitchen He did not undergo colonoscopy. CEA was 29. He was taken to the operating room by my partner, Dr. Grandville Chambers, on 03/28/17 for exploratory laparotomy, long Hartmann's and Dr. Alinda Chambers came in for left nephrectomy. He ultimately recovered well from this and was discharged home on postoperative day #4. He has had some stoma prolapse and icterus. Rectum since his surgery. He denies any issues with the ostomy's function. He reports feeling well today.  No prior colonoscopy  PMH: Hypertension (well controlled on oral antihypertensive)  PSH: Appendectomy in the 1960s; exploratory laparoscopy-splenic flexure resection with colostomy-left nephrectomy for/2018  FHx: Denies FHx of malignancy  Social: Denies use of tobacco/EtOH/drugs  ROS: A comprehensive 10 system review of systems was completed with the patient and pertinent findings as noted above.  The patient is a 77 year old male.   Allergies Shawn Chambers, Shawn Chambers; 04/03/2018 9:41 AM) Crestor *ANTIHYPERLIPIDEMICS*  Allergies Reconciled   Medication History Shawn Chambers, Shawn Chambers; 04/03/2018 9:41 AM) Lisinopril-Hydrochlorothiazide (10-12.5MG   Tablet, Oral) Active. PrednisoLONE Acetate (1% Suspension, Ophthalmic) Active. Medications Reconciled    Review of Systems Shawn Chambers; 04/03/2018 10:01 AM) General Not Present- Appetite Loss, Chills, Fatigue, Fever, Night Sweats, Weight Gain and Weight Loss. Skin Not Present- Change in Wart/Mole, Dryness, Hives, Jaundice, New Lesions, Non-Healing Wounds, Rash and Ulcer. HEENT Not Present- Earache, Hearing Loss, Hoarseness, Nose Bleed, Oral Ulcers, Ringing in the Ears, Seasonal Allergies, Sinus Pain, Sore Throat, Visual Disturbances, Wears glasses/contact lenses and Yellow Eyes. Respiratory Not Present- Bloody sputum, Chronic Cough, Difficulty Breathing, Snoring and Wheezing. Breast Not Present- Breast Mass, Breast Pain, Nipple Discharge and Skin Changes. Cardiovascular Not Present- Chest Pain, Difficulty Breathing Lying Down, Leg Cramps, Palpitations, Rapid Heart Rate, Shortness of Breath and Swelling of Extremities. Gastrointestinal Not Present- Abdominal Pain, Bloating, Bloody Stool, Change in Bowel Habits, Chronic diarrhea, Constipation, Difficulty Swallowing, Excessive gas, Gets full quickly at meals, Hemorrhoids, Indigestion, Nausea, Rectal Pain and Vomiting. Male Genitourinary Not Present- Blood in Urine, Change in Urinary Stream, Frequency, Impotence, Nocturia, Painful Urination, Urgency and Urine Leakage. Psychiatric Not Present- Anxiety and Depression. Hematology Not Present- Blood Clots and Blood Thinners.  Vitals Shawn Chambers Shawn Chambers; 04/03/2018 9:41 AM) 04/03/2018 9:41 AM Weight: 197.38 lb Height: 70in Body Surface Area: 2.08 m Body Mass Index: 28.32 kg/m  Temp.: 98.20F(Oral)  Pulse: 86 (Regular)  BP: 120/77 (Sitting, Left Arm, Standard)       Physical Exam Shawn Chambers; 04/03/2018 10:02 AM) The physical exam findings are as follows: Note:Constitutional: No acute distress; conversant; no deformities Eyes: Moist conjunctiva; no  lid lag; anicteric sclerae; pupils equal round and reactive to light Neck: Trachea midline; no palpable thyromegaly Lungs: Normal respiratory effort; no tactile fremitus CV: Regular rate and rhythm; no palpable thrill; no pitting edema GI: Abdomen  soft, nontender, nondistended; no palpable hepatosplenomegaly; stoma left upper abdomen with 5-6cm prolapse; pink; brown stool in appliance MSK: Normal gait; no clubbing/cyanosis Psychiatric: Appropriate affect; alert and oriented 3 Lymphatic: No palpable cervical or axillary lymphadenopathy    Assessment & Plan Shawn Chambers; 04/03/2018 10:04 AM) COLON CANCER (C18.9) Impression: Shawn Chambers is a very pleasant 77yo male with hx of splenic flexure mass-adenocarcinoma-T3 N0 M0 status post long Hartman's and left nephrectomy for incidentally found left kidney tumor. This was done in April 2018 -No prior colonoscopy, we'll schedule for one -He is due for surveillance imaging-CT chest/abdomen/pelvis as well as CEA level -We discussed at length the anatomy and physiology of the GI tract in his current anatomic configuration. We discussed the pathophysiology of colon cancer. We discussed the indication for colonoscopy. We discussed the planned procedure, material risks (including, but not limited to, pain, bleeding, need for additional procedures, colon perforation, recurrence of polyps) benefits and alternatives. His questions were answered to his satisfaction, he voiced understanding, and elected to proceed with colonoscopy. -We will plan to have him follow-up in the office following his colonoscopy, imaging, and lab work.  Signed electronically by Shawn Chambers, Chambers (04/03/2018 10:04 AM)

## 2018-04-11 ENCOUNTER — Other Ambulatory Visit: Payer: Self-pay | Admitting: Surgery

## 2018-04-11 DIAGNOSIS — C189 Malignant neoplasm of colon, unspecified: Secondary | ICD-10-CM

## 2018-08-09 ENCOUNTER — Telehealth: Payer: Self-pay

## 2018-08-09 NOTE — Telephone Encounter (Signed)
Left message with family member for pt to return call to our office ASAP.   ----Message from Dr. Benay Spice---- CEA was elevated in June 2018. Office attempted to contact pt and left message regarding results. Pt did not return for 59mo f/u.   Dr. Benay Spice recommends a f/u CEA and an office visit with Lattie Haw or Dr. Benay Spice within the next month

## 2018-08-10 ENCOUNTER — Telehealth: Payer: Self-pay | Admitting: *Deleted

## 2018-08-10 NOTE — Telephone Encounter (Signed)
Called and left message for patient to return call. Dr. Benay Spice would like to see patient in the month of September with lab prior to repeat CEA.

## 2018-08-11 ENCOUNTER — Telehealth: Payer: Self-pay | Admitting: Oncology

## 2018-08-11 NOTE — Telephone Encounter (Signed)
Scheduled appt per 8/29 sch message and sent reminder letter in the mail.

## 2018-08-24 ENCOUNTER — Other Ambulatory Visit: Payer: Self-pay | Admitting: Nurse Practitioner

## 2018-08-24 DIAGNOSIS — C186 Malignant neoplasm of descending colon: Secondary | ICD-10-CM

## 2018-08-28 ENCOUNTER — Inpatient Hospital Stay: Payer: Medicare HMO | Attending: Nurse Practitioner | Admitting: Nurse Practitioner

## 2018-08-28 ENCOUNTER — Inpatient Hospital Stay: Payer: Medicare HMO

## 2018-11-01 ENCOUNTER — Other Ambulatory Visit: Payer: Self-pay | Admitting: Surgery

## 2018-11-01 DIAGNOSIS — C189 Malignant neoplasm of colon, unspecified: Secondary | ICD-10-CM

## 2018-11-15 ENCOUNTER — Other Ambulatory Visit: Payer: Self-pay | Admitting: Surgery

## 2018-11-15 DIAGNOSIS — C189 Malignant neoplasm of colon, unspecified: Secondary | ICD-10-CM

## 2018-11-22 ENCOUNTER — Ambulatory Visit
Admission: RE | Admit: 2018-11-22 | Discharge: 2018-11-22 | Disposition: A | Discharge status: Home / Self Care | Payer: Medicare HMO | Source: Ambulatory Visit | Attending: Surgery | Admitting: Surgery

## 2018-11-22 DIAGNOSIS — C189 Malignant neoplasm of colon, unspecified: Secondary | ICD-10-CM

## 2018-11-22 MED ORDER — IOPAMIDOL (ISOVUE-300) INJECTION 61%
100.0000 mL | Freq: Once | INTRAVENOUS | Status: AC | PRN
  Administered 2018-11-22: 60 mL via INTRAVENOUS

## 2018-11-28 ENCOUNTER — Ambulatory Visit
Admission: RE | Admit: 2018-11-28 | Discharge: 2018-11-28 | Disposition: A | Discharge status: Home / Self Care | Payer: Medicare HMO | Source: Ambulatory Visit | Attending: Surgery | Admitting: Surgery

## 2018-11-28 DIAGNOSIS — C189 Malignant neoplasm of colon, unspecified: Secondary | ICD-10-CM

## 2018-12-15 ENCOUNTER — Other Ambulatory Visit: Payer: Self-pay | Admitting: Surgery

## 2018-12-15 DIAGNOSIS — C189 Malignant neoplasm of colon, unspecified: Secondary | ICD-10-CM

## 2018-12-26 ENCOUNTER — Other Ambulatory Visit: Payer: Self-pay | Admitting: Surgery

## 2018-12-26 DIAGNOSIS — C189 Malignant neoplasm of colon, unspecified: Secondary | ICD-10-CM

## 2019-01-24 ENCOUNTER — Other Ambulatory Visit: Payer: Self-pay | Admitting: Surgery

## 2019-01-29 ENCOUNTER — Other Ambulatory Visit: Payer: Self-pay | Admitting: Surgery

## 2019-02-01 ENCOUNTER — Ambulatory Visit
Admission: RE | Admit: 2019-02-01 | Discharge: 2019-02-01 | Disposition: A | Discharge status: Home / Self Care | Payer: Medicare HMO | Source: Ambulatory Visit | Attending: Surgery | Admitting: Surgery

## 2019-02-01 ENCOUNTER — Other Ambulatory Visit: Payer: Medicare HMO

## 2019-02-01 ENCOUNTER — Other Ambulatory Visit: Payer: Self-pay | Admitting: Surgery

## 2019-02-01 ENCOUNTER — Inpatient Hospital Stay: Admission: RE | Admit: 2019-02-01 | Payer: Medicare HMO | Source: Ambulatory Visit

## 2019-02-01 DIAGNOSIS — Z77018 Contact with and (suspected) exposure to other hazardous metals: Secondary | ICD-10-CM

## 2019-02-01 DIAGNOSIS — C189 Malignant neoplasm of colon, unspecified: Secondary | ICD-10-CM

## 2019-02-01 MED ORDER — GADOBENATE DIMEGLUMINE 529 MG/ML IV SOLN
15.0000 mL | Freq: Once | INTRAVENOUS | Status: AC | PRN
  Administered 2019-02-01: 15 mL via INTRAVENOUS

## 2019-02-12 ENCOUNTER — Other Ambulatory Visit: Payer: Medicare HMO

## 2019-02-12 DIAGNOSIS — C186 Malignant neoplasm of descending colon: Secondary | ICD-10-CM

## 2019-02-12 NOTE — Progress Notes (Signed)
Spoke with patient's daughter, Caryl Pina, to advise that [facility] has been trying to reach patient and has sent two certified letters to give results of MRI from 01/20/19. I also advised that she will be receiving calls to schedule colonoscopy/GI, oncology and rad/onc appointments.Referral for to [facility] for colonoscopy placed and message sent to RN at [facility]. Referral placed to radiation oncology.

## 2019-02-13 ENCOUNTER — Telehealth: Payer: Self-pay

## 2019-02-13 NOTE — Telephone Encounter (Signed)
Called patient to advise of initial consult with Dr. Benay Spice on 02/19/19 @ 8 AM. Physical address and my direct line for questions or concerns provided.

## 2019-02-16 ENCOUNTER — Encounter: Payer: Self-pay | Admitting: Gastroenterology

## 2019-02-19 ENCOUNTER — Telehealth: Payer: Self-pay | Admitting: Oncology

## 2019-02-19 ENCOUNTER — Inpatient Hospital Stay: Payer: Medicare HMO | Attending: Oncology | Admitting: Oncology

## 2019-02-19 ENCOUNTER — Other Ambulatory Visit: Payer: Self-pay

## 2019-02-19 ENCOUNTER — Inpatient Hospital Stay: Payer: Medicare HMO

## 2019-02-19 ENCOUNTER — Encounter: Payer: Self-pay | Admitting: *Deleted

## 2019-02-19 VITALS — BP 141/91 | HR 80 | Temp 98.6°F | Resp 17 | Wt 185.2 lb

## 2019-02-19 DIAGNOSIS — R194 Change in bowel habit: Secondary | ICD-10-CM | POA: Insufficient documentation

## 2019-02-19 DIAGNOSIS — Z9049 Acquired absence of other specified parts of digestive tract: Secondary | ICD-10-CM | POA: Insufficient documentation

## 2019-02-19 DIAGNOSIS — Z85038 Personal history of other malignant neoplasm of large intestine: Secondary | ICD-10-CM | POA: Diagnosis not present

## 2019-02-19 DIAGNOSIS — C20 Malignant neoplasm of rectum: Secondary | ICD-10-CM | POA: Insufficient documentation

## 2019-02-19 DIAGNOSIS — C186 Malignant neoplasm of descending colon: Secondary | ICD-10-CM

## 2019-02-19 DIAGNOSIS — Z933 Colostomy status: Secondary | ICD-10-CM | POA: Diagnosis not present

## 2019-02-19 DIAGNOSIS — Z905 Acquired absence of kidney: Secondary | ICD-10-CM | POA: Insufficient documentation

## 2019-02-19 DIAGNOSIS — R152 Fecal urgency: Secondary | ICD-10-CM | POA: Diagnosis not present

## 2019-02-19 DIAGNOSIS — Z87891 Personal history of nicotine dependence: Secondary | ICD-10-CM | POA: Insufficient documentation

## 2019-02-19 LAB — COMPREHENSIVE METABOLIC PANEL
ALT: 11 U/L (ref 0–44)
AST: 20 U/L (ref 15–41)
Albumin: 4.2 g/dL (ref 3.5–5.0)
Alkaline Phosphatase: 58 U/L (ref 38–126)
Anion gap: 7 (ref 5–15)
BUN: 16 mg/dL (ref 8–23)
CO2: 28 mmol/L (ref 22–32)
Calcium: 9.2 mg/dL (ref 8.9–10.3)
Chloride: 106 mmol/L (ref 98–111)
Creatinine, Ser: 1.16 mg/dL (ref 0.61–1.24)
GFR calc Af Amer: >60 mL/min (ref >60)
GFR, EST NON AFRICAN AMERICAN: 60 mL/min — AB (ref >60)
Glucose, Bld: 98 mg/dL (ref 70–99)
Potassium: 4.1 mmol/L (ref 3.5–5.1)
Sodium: 141 mmol/L (ref 135–145)
Total Bilirubin: 0.5 mg/dL (ref 0.3–1.2)
Total Protein: 6.8 g/dL (ref 6.5–8.1)

## 2019-02-19 LAB — CBC WITH DIFFERENTIAL (CANCER CENTER ONLY)
Abs Immature Granulocytes: 0.01 10*3/uL (ref 0.00–0.07)
BASOS PCT: 1 %
Basophils Absolute: 0 10*3/uL (ref 0.0–0.1)
Eosinophils Absolute: 0.1 10*3/uL (ref 0.0–0.5)
Eosinophils Relative: 2 %
HCT: 37.7 % — ABNORMAL LOW (ref 39.0–52.0)
Hemoglobin: 12.3 g/dL — ABNORMAL LOW (ref 13.0–17.0)
IMMATURE GRANULOCYTES: 0 %
Lymphocytes Relative: 18 %
Lymphs Abs: 1.2 10*3/uL (ref 0.7–4.0)
MCH: 29.2 pg (ref 26.0–34.0)
MCHC: 32.6 g/dL (ref 30.0–36.0)
MCV: 89.5 fL (ref 80.0–100.0)
Monocytes Absolute: 0.7 10*3/uL (ref 0.1–1.0)
Monocytes Relative: 10 %
NEUTROS PCT: 69 %
Neutro Abs: 4.5 10*3/uL (ref 1.7–7.7)
Platelet Count: 159 10*3/uL (ref 150–400)
RBC: 4.21 MIL/uL — ABNORMAL LOW (ref 4.22–5.81)
RDW: 12.9 % (ref 11.5–15.5)
WBC Count: 6.6 10*3/uL (ref 4.0–10.5)
nRBC: 0 % (ref 0.0–0.2)

## 2019-02-19 LAB — CEA (IN HOUSE-CHCC): CEA (CHCC-In House): 99.15 ng/mL — ABNORMAL HIGH (ref 0.00–5.00)

## 2019-02-19 NOTE — Telephone Encounter (Signed)
Scheduled appt per 3/9 los. ° °Printed calendar and avs. °

## 2019-02-19 NOTE — Progress Notes (Signed)
[facility] OFFICE PROGRESS NOTE   Diagnosis: History of colon cancer  INTERVAL HISTORY:   Mr. Lomeli was seen at the cancer center in 2018 after he was diagnosed with stage II colon cancer.  Adjuvant therapy was not recommended.  The CEA was elevated following surgery.  He did not return for scheduled follow-up.    He saw Dr. Dema Severin to consider reversal of the colostomy.  He was scheduled for colonoscopy in April 2019, but did not return for this procedure. He underwent restaging CTs on 11/22/2018.  These wall thickening was noted in the rectum with polypoid soft tissue in the lumen of the transverse colon proximal to the colostomy.  This was felt to potentially represent stool. New soft tissue stranding and nodularity throughout the perirectal fat.  A presacral node measures 13 x 10 mm.  No abdominal adenopathy.  A 14 mm hyperdense nodule contiguous with the prostate protrudes to the bladder base.  No focal liver abnormality.  No suspicious pulmonary nodule.  MRI of the pelvis on 02/01/2019 revealed a rectal tumor measured at 7 cm from the anal verge, spanning 11.5 cm in length, tumor extends through the muscularis propria, a presacral lymph node measured 15 mm.  The tumor was staged as a T3BN1 lesion.  Mr. Brodhead is here today with his daughter.  His chief complaint is rectal urgency and frequent bowel movements.  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 rate 17, weight 185 lb 3.2 oz (84 kg), SpO2 98 %.    HEENT: Oropharynx without visible mass, neck without mass Lymphatics: No cervical, supraclavicular, axillary, or inguinal nodes Resp: Lungs clear  bilaterally Cardio: Regular rate and rhythm GI: No hepatosplenomegaly, no mass, nontender, left lower quadrant colostomy with prolapsed bowel Vascular: No leg edema Neuro: Alert and oriented, the motor exam appears intact in the upper and lower extremities bilaterally   Lab Results:  Lab Results  Component Value Date   WBC 6.6 02/19/2019   HGB 12.3 (L) 02/19/2019   HCT 37.7 (L) 02/19/2019   MCV 89.5 02/19/2019   PLT 159 02/19/2019   NEUTROABS 4.5 02/19/2019    CMP  Lab Results  Component Value Date   NA 141 02/19/2019   K 4.1 02/19/2019   CL 106 02/19/2019   CO2 28 02/19/2019   GLUCOSE 98 02/19/2019   BUN 16 02/19/2019   CREATININE 1.16 02/19/2019   CALCIUM 9.2 02/19/2019   PROT 6.8 02/19/2019   ALBUMIN 4.2 02/19/2019   AST 20 02/19/2019   ALT 11 02/19/2019   ALKPHOS 58 02/19/2019   BILITOT 0.5 02/19/2019   GFRNONAA 60 (L) 02/19/2019   GFRAA >60 02/19/2019    Lab Results  Component Value Date   CEA1 11.15 (H) 05/23/2017     Imaging:  I reviewed the 02/01/2019 pelvic MRI with Mr. Schwabe and his daughter.  I reviewed the 11/28/2018 CT images Medications: I have reviewed the patient's current medications.   Assessment/Plan:  1. Colon cancer, descending colon, stage II (T3 N0), status post a partial left colectomy 03/28/2017  MSI-stable, no loss of mismatch repair protein expression  CT abdomen/pelvis 03/26/2017 and CT chest 03/27/2017-no evidence metastatic disease, abdomen/pelvis CT-long segment of bowel  wall thickening in the left colon, partial mechanical obstruction by the area of thickening in the left colon, solid enhancing left renal lesion  Elevated preoperative CEA  CT 11/15/2018- irregular rectal wall thickening/perirectal stranding, polypoid defect in the mid transverse colon proximal to the colostomy-indeterminate, nodularity of the prostate with a hyperdense component in the median lobe-stable  MRI pelvis 02/01/2019- T3BN1 rectal tumor at 4.5  cm from the internal anal sphincter, 11.5 cm in length  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   BPH  4.   History of tobacco use  5.   History of an irregular mole at the right upper back   Disposition: Mr. Henner has a remote history of colon cancer.  He did not return for scheduled follow-up after being diagnosed with colon cancer. Now he presents with rectal urgency and frequent bowel movements.  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  There is no clinical or x-ray evidence of distant metastatic disease.  I discussed the probable diagnosis of rectal cancer with Mr. Okane and his daughter.  He understands this may represent a new rectal primary versus a recurrence of the previously diagnosed colon cancer.  He has not undergone a colonoscopy.  He has been referred to Dr. Fuller Plan for a colonoscopy and is scheduled for an office visit in April.  I will contact gastroenterology and try to expedite a colonoscopy.  He is scheduled to see Dr. Lisbeth Renshaw tomorrow.  We will coordinate a treatment plan after he undergoes a colonoscopy.  I will present his case at the GI tumor conference.  45 minutes were spent with the patient today.  The majority of the time was used for counseling and coordination of care.  Betsy Coder, MD  02/19/2019  1:56 PM

## 2019-02-19 NOTE — Progress Notes (Signed)
GI Location of Tumor / Histology: Rectal Cancer  Call Daughter Vicky with all appointments: [phone]  Rectal Adenocarcinoma T3 B N1  Shawn Chambers presented   MRI Pelvis 01/20/2019:  Large segment of circumferential mucosal tumor spanning the junction the sigmoid colon and rectum measuring 9 cm in length.  There is a portion of the tumor which appears to extend further into the rectum along the ventral surface for several 2.5 cm.  Tumor distance from anal verge 7 cm, distance from internal anal sphincter 4.5 cm.  DG Colon 11/28/2018: There is an apple core type lesion within the rectosigmoid colon worrisome for rectosigmoid colon carcinoma.  Diverticula within the distal descending and rectosigmoid colon.  Conceivable this area of narrowing could represent stricture, but the appearance is far more worrisome for malignancy.   CT Chest 11/22/2018: No significant findings in the chest, no evidence of thoracic metastatic disease.  Interval descending colectomy and distal transverse colostomy.  There is irregular thickening throughout the rectum with perirectal soft tissue stranding and nodularity.  Although these findings could be secondary to pelvic irradiation, rectal malignancy is a concern. Recommend colonoscopy and/or PET-CT.   Biopsies of   Past/Anticipated interventions by surgeon, if any:   Past/Anticipated interventions by medical oncology, if any:  Dr. Benay Spice 02/19/2019 8 am   Weight changes, if any: Lost a few pounds over the last 2 years, after his colonoscopy.  Bowel/Bladder complaints, if any: No changes to bowels.  Has urinary urgency/difficulty, takes flomax  Nausea / Vomiting, if any: No  Pain issues, if any: No  Any blood per rectum:     BP (!) 146/72 (BP Location: Left Arm, Patient Position: Sitting)   Pulse 76   Temp 98.5 F (36.9 C) (Oral)   Resp 18   Ht 5' 8.5" (1.74 m)   Wt 185 lb (83.9 kg)   SpO2 99%   BMI 27.72 kg/m    Wt Readings from Last 3  Encounters:  02/20/19 185 lb (83.9 kg)  02/19/19 185 lb 3.2 oz (84 kg)  05/23/17 199 lb 1.6 oz (90.3 kg)    SAFETY ISSUES:  Prior radiation? No  Pacemaker/ICD? No  Possible current pregnancy? N/A  Is the patient on methotrexate? No  Current Complaints/Details: 1. -Colon cancer, descending colon, stage II (T3 N0), status post a partial left colectomy 03/28/2017 ? MSI-stable, no loss of mismatch repair protein expression ? CT abdomen/pelvis 03/26/2017 and CT chest 03/27/2017-no evidence metastatic disease, abdomen/pelvis CT-long segment of bowel wall thickening in the left colon, partial mechanical obstruction by the area of thickening in the left colon, solid enhancing left renal lesion ? Elevated preoperative CEA

## 2019-02-19 NOTE — Progress Notes (Addendum)
Radiation Oncology         ([phone]) [phone] ________________________________  Name: Shawn Chambers        MRN: [id number]  Date of Service: 02/20/2019 DOB: 02/29/1941  VV:[id number], No Pcp Per  Ladell Pier, MD     REFERRING PHYSICIAN: Ladell Pier, MD   DIAGNOSIS: The primary encounter diagnosis was Cancer of descending colon ([place]). A diagnosis of Rectal carcinoma ([place]) was also pertinent to this visit.   HISTORY OF PRESENT ILLNESS: Shawn Chambers is a 78 y.o. male seen at the request of Dr. Benay Spice for a newly diagnosed rectal cancer with a history of Stage II colon cancer arising in the splenic flexure. The patient was diagnosed with a mass in the splenic flexure at the time of presenting to the hospital with a bowel obstruction.  He was incidentally noted to have a 2.6 cm enhancing left renal mass and no evidence by imaging of metastatic disease.  On 03/28/2017 he was taken to the operating room where he underwent left radical nephrectomy and mobilization of the splenic flexure with resection of the colon.  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  He did not require adjuvant systemic therapy.  It is notable that he had an elevated CEA preoperatively.  He has been followed by Dr. Dema Severin, and in December 2019 underwent CT restaging of the chest abdomen and pelvis.  This was performed on 11/22/2018 and revealed concerns for new thickening in the rectum.  There was also nodularity in the prostate gland.  He underwent an MRI of the pelvis on 02/01/2019, a tumor in the rectum was noted 7 cm from the anal verge and 4.5 cm from the internal anal sphincter.  There was a large segment of circumferential mucosal tumor within the rectum extending to the sigmoid colon measuring 9 cm in length.  The tumor in total was 11.5 cm.  There were changes of the tumor extending into the perirectal fat 1 to 5 mm, as well as  concerns for presacral adenopathy as well as mesorectal adenopathy.  His tumor was staged as a T3N1.  Of note his CEA was 99.15 yesterday and 11.15 a year ago.  He comes today to discuss plans for chemoradiation.  He saw Dr. Benay Spice yesterday, and is to see GI today to discuss colonoscopy.     PREVIOUS RADIATION THERAPY: No   PAST MEDICAL HISTORY:  Past Medical History:  Diagnosis Date  . Cancer ([place])   . GERD (gastroesophageal reflux disease)   . Seizures ([place])    resolved       PAST SURGICAL HISTORY: Past Surgical History:  Procedure Laterality Date  . APPENDECTOMY    . PARTIAL COLECTOMY N/A 03/28/2017   Procedure: PARTIAL COLECTOMY/COLOSTOMY;  Surgeon: Georganna Skeans, MD;  Location: [facility];  Service: General;  Laterality: N/A;  Partial Colectomy, Mobilization of Splenic Flexure, Colostomy  . PARTIAL NEPHRECTOMY Left 03/28/2017   Procedure: NEPHRECTOMY TOTAL;  Surgeon: Raynelle Bring, MD;  Location: [facility];  Service: Urology;  Laterality: Left;     FAMILY HISTORY: History reviewed. No pertinent family history.   SOCIAL HISTORY:  reports that he quit smoking about 23 months ago. His smoking use included cigarettes. He has a 60.00 pack-year smoking history. He has never used smokeless tobacco. He reports that he does not drink alcohol or use drugs.   ALLERGIES: Patient has no known allergies.   MEDICATIONS:  Current Outpatient  Medications  Medication Sig Dispense Refill  . docusate sodium (COLACE) 100 MG capsule Take 1 capsule (100 mg total) by mouth 2 (two) times daily. 10 capsule 0  . latanoprost (XALATAN) 0.005 % ophthalmic solution     . tamsulosin (FLOMAX) 0.4 MG CAPS capsule Take 0.4 mg by mouth daily.    Marland Kitchen aspirin 81 MG chewable tablet Chew 81 mg by mouth every other day.     No current facility-administered medications for this encounter.      REVIEW OF SYSTEMS: On review of systems, the patient reports that he is doing well overall. He has night time mucoid  urgency from the rectum and intermittent rectal bleeding for the last 2-3 months. He denies any chest pain, shortness of breath, cough, fevers, chills, night sweats, unintended weight changes. He denies any abdominal pain, nausea or vomiting. He does have urinary urgency as a result of his prostate. He  denies any new musculoskeletal or joint aches or pains. A complete review of systems is obtained and is otherwise negative.     PHYSICAL EXAM:  Wt Readings from Last 3 Encounters:  02/20/19 185 lb (83.9 kg)  02/19/19 185 lb 3.2 oz (84 kg)  05/23/17 199 lb 1.6 oz (90.3 kg)   Temp Readings from Last 3 Encounters:  02/20/19 98.5 F (36.9 C) (Oral)  02/19/19 98.6 F (37 C) (Oral)  05/23/17 98.8 F (37.1 C) (Oral)   BP Readings from Last 3 Encounters:  02/20/19 (!) 146/72  02/19/19 (!) 141/91  05/23/17 (!) 148/67   Pulse Readings from Last 3 Encounters:  02/20/19 76  02/19/19 80  05/23/17 75   Pain Assessment Pain Score: 2 /10  In general this is a well appearing caucasian male in no acute distress. He is alert and oriented x4 and appropriate throughout the examination. HEENT reveals that the patient is normocephalic, atraumatic. EOMs are intact.  Skin is intact without any evidence of gross lesions. Cardiopulmonary assessment is negative for acute distress and he exhibits normal effort.     ECOG = 1  0 - Asymptomatic (Fully active, able to carry on all predisease activities without restriction)  1 - Symptomatic but completely ambulatory (Restricted in physically strenuous activity but ambulatory and able to carry out work of a light or sedentary nature. For example, light housework, office work)  2 - Symptomatic, <50% in bed during the day (Ambulatory and capable of all self care but unable to carry out any work activities. Up and about more than 50% of waking hours)  3 - Symptomatic, >50% in bed, but not bedbound (Capable of only limited self-care, confined to bed or chair 50%  or more of waking hours)  4 - Bedbound (Completely disabled. Cannot carry on any self-care. Totally confined to bed or chair)  5 - Death   Eustace Pen MM, Creech RH, Tormey DC, et al. [phone]). "Toxicity and response criteria of the [facility]". [place] Oncol. 5 (6): 649-55    LABORATORY DATA:  Lab Results  Component Value Date   WBC 6.6 02/19/2019   HGB 12.3 (L) 02/19/2019   HCT 37.7 (L) 02/19/2019   MCV 89.5 02/19/2019   PLT 159 02/19/2019   Lab Results  Component Value Date   NA 141 02/19/2019   K 4.1 02/19/2019   CL 106 02/19/2019   CO2 28 02/19/2019   Lab Results  Component Value Date   ALT 11 02/19/2019   AST 20 02/19/2019   ALKPHOS 58 02/19/2019  BILITOT 0.5 02/19/2019      RADIOGRAPHY: Dg Eye Foreign Body  Result Date: 02/01/2019 CLINICAL DATA:  Metal working/exposure; clearance prior to MRI EXAM: ORBITS FOR FOREIGN BODY - 2 VIEW COMPARISON:  None. FINDINGS: There is no evidence of metallic foreign body within the orbits. No significant bone abnormality identified. IMPRESSION: No evidence of metallic foreign body within the orbits. Electronically Signed   By: Inge Rise M.D.   On: 02/01/2019 12:36   Mr Pelvis W VZ Contrast  Result Date: 02/01/2019 CLINICAL DATA:  Presumably rectal carcinoma. No colonoscopy report. No clinical data in epic. EXAM: MRI PELVIS WITHOUT AND WITH CONTRAST TECHNIQUE: Multiplanar multisequence MR imaging of the pelvis was performed both before and after administration of intravenous contrast. Small amount of [place] gel was administered per rectum to optimize tumor evaluation. CONTRAST:  63mL MULTIHANCE GADOBENATE DIMEGLUMINE 529 MG/ML IV SOLN COMPARISON:  None. CT 11/22/2018 FINDINGS: TUMOR LOCATION Tumor distance from Anal Verge/Skin Surface:  7 cm Tumor distance from Internal Anal Sphincter:  4.5 cm TUMOR DESCRIPTION Circumferential Extent: Large segment of circumferential mucosal tumor spanning the junction the  sigmoid colon and rectum measuring 9 cm in length (image 21/3). There is a portion of the tumor which appears to extend further into the rectum along the ventral surface for several 2.5 cm on axial image 22/9. With this more distal extension into the mid rectum there is polypoid lesions on image 24/9. Tumor Length: 11.5 cm T - CATEGORY Extension through Muscularis Propria: 1 to 5 mm equal T3 B Multiple linear enhancing foci extending perpendicular to the lumen of the rectum concerning for extension in the perirectal fat (1-5 mm). Shortest Distance of any tumor/node from Mesorectal Fascia: 3 mm. Extra serosal extension on the LEFT measuring 4 mm within the fascial plane (image 14/10). Along the inferior margin of the tumor concern for interim closer approximation to the mesorectal fascia (3 mm) (image 19/8) Extramural Vascular Invasion/Tumor Thrombus: No Invasion of Anterior Peritoneal Reflection: No Involvement of Adjacent Organs or Pelvic Sidewall: No Levator Ani Involvement: No N - CATEGORY Mesorectal Lymph Nodes >=57mm: 1, N1, presacral lymph node measuring 15 mm (image 10/4). Extra-mesorectal Lymphadenopathy: No Other:  None. IMPRESSION: Rectal adenocarcinoma T stage: T3 B Rectal adenocarcinoma N stage: N 1 Distance from tumor to the internal anal sphincter is 4.5 cm. Electronically Signed   By: Suzy Bouchard M.D.   On: 02/01/2019 14:42       IMPRESSION/PLAN: 1. Probable adenocarcinoma of the rectum. Dr. Lisbeth Renshaw discusses the pathology findings and reviews the nature of rectal cancers, and outlines the consideration of this either being a new primary versus recurrence of his prior cancer. Regardless given the bulkiness of his disease, we would offer a course of chemoRT. We discussed the risks, benefits, short, and long term effects of radiotherapy, and the patient is interested in proceeding. Dr. Lisbeth Renshaw discusses the delivery and logistics of radiotherapy and anticipates a course of 5 1/2 weeks of  radiotherapy. Written consent is obtained and placed in the chart, a copy was provided to the patient. He will be contacted to coordinate simulation. We would like to start treatment the week of 03/05/2019, depending on the findings from his colonoscopy which needs to be documented from his upcoming procedure.  2. Stage II, pT3N0M0 adenocarcinoma of the splenic flexure. The patient has been NED in this location since his surgery. He continues to be followed by Dr. Benay Spice but has been somewhat noncompliant.   In a visit lasting 82  minutes, greater than 50% of the time was spent face to face discussing his case, and coordinating the patient's care.   The above documentation reflects my direct findings during this shared patient visit. Please see the separate note by Dr. Lisbeth Renshaw on this date for the remainder of the patient's plan of care.    Carola Rhine, PAC

## 2019-02-19 NOTE — Progress Notes (Signed)
  Oncology Nurse Navigator Documentation  Navigator Location: [facility] (02/19/19 0930)   )Navigator Encounter Type: Initial MedOnc (02/19/19 0930)    Met with Shawn Chambers and his daughter, Shawn Chambers during initial consult with Dr. Benay Spice. MRI was completed on 02/01/19. Dr. Orest Dikes office has sent out multiple certified letters but was unable to get in touch with patient to give MRI results.  We finally reached patient's daughter, Shawn Chambers,  who will be the main contact moving forward. Her cell # is [phone]. 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   I explained importance of keeping his rad/onc appointment tomorrow. Patient and family will need quite a bit of reinforcement throughout treatment planning.                 Patient Visit Type: MedOnc;Initial (02/19/19 0930) Treatment Phase: Abnormal Scans (02/19/19 0930) Barriers/Navigation Needs: Education (02/19/19 0930) Education: Understanding Cancer/ Treatment Options;Coping with Diagnosis/ Prognosis (02/19/19 0930) Interventions: Education (02/19/19 0930)     Education Method: Verbal (02/19/19 0930)  Support Groups/Services: Friends and Family (02/19/19 0930)   Acuity: Level 4 (02/19/19 0930)       Acuity Level 4: Low health literacy;Ongoing guidance, education and support provided throughout the patient's treatment (02/19/19 0930) Time Spent with Patient: 45 (02/19/19 0930)

## 2019-02-20 ENCOUNTER — Encounter: Payer: Self-pay | Admitting: Internal Medicine

## 2019-02-20 ENCOUNTER — Telehealth: Payer: Self-pay | Admitting: Gastroenterology

## 2019-02-20 ENCOUNTER — Ambulatory Visit
Admission: RE | Admit: 2019-02-20 | Discharge: 2019-02-20 | Disposition: A | Discharge status: Home / Self Care | Payer: Medicare HMO | Source: Ambulatory Visit | Attending: Radiation Oncology | Admitting: Radiation Oncology

## 2019-02-20 ENCOUNTER — Other Ambulatory Visit: Payer: Self-pay

## 2019-02-20 ENCOUNTER — Ambulatory Visit: Payer: Medicare HMO | Admitting: Internal Medicine

## 2019-02-20 ENCOUNTER — Encounter: Payer: Self-pay | Admitting: Radiation Oncology

## 2019-02-20 ENCOUNTER — Telehealth: Payer: Self-pay

## 2019-02-20 VITALS — BP 146/72 | HR 76 | Temp 98.5°F | Resp 18 | Ht 68.5 in | Wt 185.0 lb

## 2019-02-20 VITALS — BP 148/70 | HR 74 | Ht 68.0 in | Wt 185.1 lb

## 2019-02-20 DIAGNOSIS — Z85038 Personal history of other malignant neoplasm of large intestine: Secondary | ICD-10-CM | POA: Diagnosis not present

## 2019-02-20 DIAGNOSIS — C186 Malignant neoplasm of descending colon: Secondary | ICD-10-CM

## 2019-02-20 DIAGNOSIS — K219 Gastro-esophageal reflux disease without esophagitis: Secondary | ICD-10-CM | POA: Insufficient documentation

## 2019-02-20 DIAGNOSIS — Z85528 Personal history of other malignant neoplasm of kidney: Secondary | ICD-10-CM | POA: Diagnosis not present

## 2019-02-20 DIAGNOSIS — Z79899 Other long term (current) drug therapy: Secondary | ICD-10-CM | POA: Insufficient documentation

## 2019-02-20 DIAGNOSIS — R933 Abnormal findings on diagnostic imaging of other parts of digestive tract: Secondary | ICD-10-CM | POA: Diagnosis not present

## 2019-02-20 DIAGNOSIS — Z7982 Long term (current) use of aspirin: Secondary | ICD-10-CM | POA: Insufficient documentation

## 2019-02-20 DIAGNOSIS — Z933 Colostomy status: Secondary | ICD-10-CM | POA: Diagnosis not present

## 2019-02-20 DIAGNOSIS — R970 Elevated carcinoembryonic antigen [CEA]: Secondary | ICD-10-CM | POA: Insufficient documentation

## 2019-02-20 DIAGNOSIS — C20 Malignant neoplasm of rectum: Secondary | ICD-10-CM

## 2019-02-20 DIAGNOSIS — Z87891 Personal history of nicotine dependence: Secondary | ICD-10-CM | POA: Diagnosis not present

## 2019-02-20 DIAGNOSIS — K469 Unspecified abdominal hernia without obstruction or gangrene: Secondary | ICD-10-CM

## 2019-02-20 DIAGNOSIS — K6289 Other specified diseases of anus and rectum: Secondary | ICD-10-CM | POA: Diagnosis not present

## 2019-02-20 HISTORY — DX: Malignant neoplasm of unspecified kidney, except renal pelvis: C64.9

## 2019-02-20 HISTORY — DX: Benign prostatic hyperplasia without lower urinary tract symptoms: N40.0

## 2019-02-20 HISTORY — DX: Malignant neoplasm of rectum: C20

## 2019-02-20 NOTE — Telephone Encounter (Signed)
Received message from Dr. Henrene Pastor to get pt scheduled to be seen asap with any provider. Dr. Hilarie Fredrickson has an opening today at 1:45pm. Daughter is going to call back to let me know if they can take this appt.

## 2019-02-20 NOTE — Telephone Encounter (Signed)
-----   Message from Irene Shipper, MD sent at 02/19/2019  4:55 PM EDT ----- Regarding: appointment [place], I am away all week as well, but get this patient an OV asap with any provider or APP with an open slot.Thanks! Dr. Henrene Pastor ----- Message ----- From: Ladell Pier, MD Sent: 02/19/2019   2:25 PM EDT To: Irene Shipper, MD  I am messaging you to ask for help with this patient  He was diagnosed with colon cancer in 2018, did not receive adjuvant therapy, and was lost to follow-up.  He has never undergone a colonoscopy  Now appears to have rectal cancer, symptomatic with urgency and pain, T3BN1 based on a pelvic MRI last month   He was referred to [facility] for a colonoscopy and is scheduled for an office visit in April  Malcolm is out all week  Can you or someone else in the group see him sooner?  He has a transverse colostomy created at the time of the left colon cancer in 2018   Thanks for your help  Leroy Sea

## 2019-02-20 NOTE — Telephone Encounter (Signed)
Pt's daughter [name] called stating that she was returning your call. Pt has never been seen but do have an appt with Dr. Fuller Plan. I was not sure if pt's daughter was calling the wrong place, she insisted that Vaughan Basta from [facility] called her regarding a colonoscopy. Pls call her.

## 2019-02-20 NOTE — Telephone Encounter (Signed)
Pt scheduled to see Dr. Hilarie Fredrickson today at 1:45pm, pts daughter aware of appt.

## 2019-02-20 NOTE — Progress Notes (Signed)
Patient ID: Shawn Chambers, male   DOB: 16-Mar-1941, 78 y.o.   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 Benay Spice and Dr. Dema Severin after imaging reveals rectal mass concerning for adenocarcinoma.  He is here today with his daughter.  In December 2019 he underwent restaging CT scans showing wall thickening in the rectum.  There was also a polypoid soft tissue lesion in the lumen of the transverse colon proximal to his colostomy.  He then had an MRI of his pelvis which showed a nearly 12 cm tumor in the rectum staged as T3N1.  He has never had a colonoscopy or flexible sigmoidoscopy.  He denies abdominal pain but reports that he has had more leakage and seepage from his rectum over the last several months.  After his colostomy was placed he would have mucus per rectum about once per day and now it is about once per hour.  Bloody discharge as well.  He has noticed a functioning ostomy but there has been growing tissue protruding from the ostomy which he states is not painful.   Past Medical History:  Diagnosis Date  . BPH (benign prostatic hyperplasia)   . GERD (gastroesophageal reflux disease)   . Glaucoma   . Rectal adenocarcinoma ([place])   . 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 Seizures ([place])    resolved    Past Surgical History:  Procedure Laterality Date  . APPENDECTOMY    . PARTIAL COLECTOMY N/A 03/28/2017   Procedure: PARTIAL COLECTOMY/COLOSTOMY;  Surgeon: Georganna Skeans, MD;  Location: [facility];  Service: General;  Laterality: N/A;  Partial Colectomy, Mobilization of Splenic Flexure, Colostomy  . PARTIAL NEPHRECTOMY Left 03/28/2017   Procedure: NEPHRECTOMY TOTAL;  Surgeon: Raynelle Bring, MD;  Location: [facility];  Service: Urology;  Laterality: Left;     Outpatient Medications Prior to Visit  Medication Sig Dispense Refill  . latanoprost (XALATAN) 0.005 % ophthalmic solution     . tamsulosin (FLOMAX) 0.4 MG CAPS capsule Take 0.4 mg by mouth daily.    Marland Kitchen aspirin 81 MG chewable tablet Chew 81 mg by mouth every other day.    . docusate sodium (COLACE) 100 MG capsule Take 1 capsule (100 mg total) by mouth 2 (two) times daily. 10 capsule 0   No facility-administered medications prior to visit.     No Known Allergies  History reviewed. No pertinent family history.  Social History   Tobacco Use  . Smoking status: Former Smoker    Packs/day: 1.00    Years: 60.00    Pack years: 60.00    Types: Cigarettes    Last attempt to quit: 03/14/2017    Years since quitting: 1.9  . Smokeless tobacco: Never Used  Substance Use Topics  . Alcohol use: No  . Drug use: No    ROS: As per history of present illness, otherwise negative  BP (!) 148/70   Pulse 74   Ht 5\' 8"  (1.727 m)   Wt 185 lb 2 oz (84 kg)   BMI 28.15 kg/m  Constitutional: Well-developed and well-nourished. No distress. HEENT: Normocephalic and atraumatic. Oropharynx is clear and moist. Conjunctivae are normal.  No scleral icterus. Neck: Neck supple. Trachea midline. Cardiovascular: Normal rate, regular rhythm and intact distal pulses. No M/R/G Pulmonary/chest: Effort normal and  breath sounds normal. No wheezing, rales or rhonchi. Abdominal: Soft, obese, colostomy left lower quadrant with 10 cm of prolapsed colon which is pink and viable in color, there is a parastomal hernia which is nontender.  Bowel sounds active throughout. Extremities: no clubbing, cyanosis, trace to 1+ lower extremity edema Neurological: Alert and oriented to person place and time. Skin: Skin is warm and dry.  Psychiatric: Normal mood and affect. Behavior is normal.  RELEVANT LABS AND IMAGING: CBC    Component Value Date/Time   WBC 6.6 02/19/2019 0954   WBC 7.6 04/01/2017 0523   RBC 4.21 (L)  02/19/2019 0954   HGB 12.3 (L) 02/19/2019 0954   HCT 37.7 (L) 02/19/2019 0954   PLT 159 02/19/2019 0954   MCV 89.5 02/19/2019 0954   MCH 29.2 02/19/2019 0954   MCHC 32.6 02/19/2019 0954   RDW 12.9 02/19/2019 0954   LYMPHSABS 1.2 02/19/2019 0954   MONOABS 0.7 02/19/2019 0954   EOSABS 0.1 02/19/2019 0954   BASOSABS 0.0 02/19/2019 0954    CMP     Component Value Date/Time   NA 141 02/19/2019 0954   K 4.1 02/19/2019 0954   CL 106 02/19/2019 0954   CO2 28 02/19/2019 0954   GLUCOSE 98 02/19/2019 0954   BUN 16 02/19/2019 0954   CREATININE 1.16 02/19/2019 0954   CALCIUM 9.2 02/19/2019 0954   PROT 6.8 02/19/2019 0954   ALBUMIN 4.2 02/19/2019 0954   AST 20 02/19/2019 0954   ALT 11 02/19/2019 0954   ALKPHOS 58 02/19/2019 0954   BILITOT 0.5 02/19/2019 0954   GFRNONAA 60 (L) 02/19/2019 0954   GFRAA >60 02/19/2019 0954   MRI PELVIS WITHOUT AND WITH CONTRAST   TECHNIQUE: Multiplanar multisequence MR imaging of the pelvis was performed both before and after administration of intravenous contrast. Small amount of [place] gel was administered per rectum to optimize tumor evaluation.   CONTRAST:  66mL MULTIHANCE GADOBENATE DIMEGLUMINE 529 MG/ML IV SOLN   COMPARISON:  None.   CT 11/22/2018   FINDINGS: TUMOR LOCATION   Tumor distance from Anal Verge/Skin Surface:  7 cm   Tumor distance from Internal Anal Sphincter:  4.5 cm   TUMOR DESCRIPTION   Circumferential Extent:   Large segment of circumferential mucosal tumor spanning the junction the sigmoid colon and rectum measuring 9 cm in length (image 21/3). There is a portion of the tumor which appears to extend further into the rectum along the ventral surface for several 2.5 cm on axial image 22/9. With this more distal extension into the mid rectum there is polypoid lesions on image 24/9.   Tumor Length: 11.5 cm   T - CATEGORY   Extension through Muscularis Propria: 1 to 5 mm equal T3 B   Multiple linear enhancing foci  extending perpendicular to the lumen of the rectum concerning for extension in the perirectal fat (1-5 mm).   Shortest Distance of any tumor/node from Mesorectal Fascia: 3 mm. Extra serosal extension on the LEFT measuring 4 mm within the fascial plane (image 14/10). Along the inferior margin of the tumor concern for interim closer approximation to the mesorectal fascia (3 mm) (image 19/8)   Extramural Vascular Invasion/Tumor Thrombus: No   Invasion of Anterior Peritoneal Reflection: No   Involvement of Adjacent Organs or Pelvic Sidewall: No   Levator Ani Involvement: No   N - CATEGORY   Mesorectal Lymph Nodes >=50mm: 1, N1, presacral lymph node measuring 15 mm (image 10/4).   Extra-mesorectal Lymphadenopathy: No  Other:  None.   IMPRESSION: Rectal adenocarcinoma T stage: T3 B   Rectal adenocarcinoma N stage: N 1   Distance from tumor to the internal anal sphincter is 4.5 cm.     Electronically Signed   By: Suzy Bouchard M.D.   On: 02/01/2019 14:42   CT CHEST, ABDOMEN, AND PELVIS WITH CONTRAST   TECHNIQUE: Multidetector CT imaging of the chest, abdomen and pelvis was performed following the standard protocol during bolus administration of intravenous contrast.   CONTRAST:  83mL ISOVUE-300 IOPAMIDOL (ISOVUE-300) INJECTION 61%   COMPARISON:  Abdominopelvic CT 03/26/2017.  Chest CT 03/27/2017.   FINDINGS: CT CHEST FINDINGS   Cardiovascular: Mild atherosclerosis of the aorta, great vessels and coronary arteries. There is some mural thrombus within the aortic arch which is similar to the prior study. Probable calcifications of the aortic valve. The heart size is normal. There is no pericardial effusion.   Mediastinum/Nodes: There are no enlarged mediastinal, hilar or axillary lymph nodes. Stable coarse calcification within the right thyroid lobe. The trachea and esophagus appear normal.   Lungs/Pleura: There is no pleural effusion. The aeration of the  lung bases has improved. There is mild residual bibasilar atelectasis. No suspicious pulmonary nodule.   Musculoskeletal/Chest wall: No chest wall mass or suspicious osseous findings.   CT ABDOMEN AND PELVIS FINDINGS   Hepatobiliary: The liver is normal in density without focal abnormality. There are stable small calcifications centrally in the liver. The gallbladder appears normal. There is no biliary dilatation.   Pancreas: Unremarkable. No pancreatic ductal dilatation or surrounding inflammatory changes.   Spleen: Normal in size without focal abnormality.   Adrenals/Urinary Tract: Both adrenal glands appear normal. Interval left nephrectomy. There is no mass in the nephrectomy bed. Evaluation of the right kidney is mildly limited by breathing artifact on the portal phase images. There are few scattered low-density right renal lesions which are stable, likely cysts. No hydronephrosis. There is mild bladder wall thickening. There is a hyperdense 14 mm nodule at the bladder base, contiguous with the prostate gland (image 111/2). This appears stable from the previous study.   Stomach/Bowel: The stomach and small bowel appear normal. The proximal colon is decompressed status post distal transverse colostomy and descending colectomy. Moderate diverticular changes are present within the sigmoid colon. There is diffuse wall thickening throughout the rectum. There is a polypoid soft tissue nodule within the lumen of the transverse colon proximal to the colostomy, measuring 2.0 cm on image 75/2. This is not clearly seen on the prior study and could reflect stool.   Vascular/Lymphatic: There is new soft tissue stranding and nodularity throughout the perirectal fat. There is a presacral node measuring up to 13 x 10 mm on image 100/2. There is no abdominal lymphadenopathy. There is diffuse aortic and branch vessel atherosclerosis with a 3.9 cm mid abdominal aortic aneurysm. No acute  vascular findings.   Reproductive: The prostate gland is mildly enlarged and nodular. There are asymmetric calcifications within the right lobe near the apex. As above, there is a 14 mm hyperdense nodule contiguous with the prostate gland, protruding into the bladder base.   Other: No peristomal hernia. Stable left inguinal hernia containing fat and fluid.   Musculoskeletal: No acute or significant osseous findings.   IMPRESSION: 1. No significant findings in the chest. No evidence of thoracic metastatic disease. 2. Interval descending colectomy and distal transverse colostomy. There is irregular thickening throughout the rectum with perirectal soft tissue stranding and nodularity. Although these findings  could be secondary to pelvic irradiation, rectal malignancy is a concern. Recommend colonoscopy and/or PET-CT. 3. Polypoid filling defect in the mid transverse colon proximal to the colostomy, indeterminate in nature. This is possibly stool as it was not seen on the prior CT. It could be addressed by PET-CT as well. 4. Nodularity of the prostate gland, including a hyperdense component involving the median lobe. This is presumed benign based on stability from 20 months ago. 5. Stable 3.9 cm abdominal aortic aneurysm. Aortic Atherosclerosis (ICD10-I70.0). 6. These results will be called to the ordering clinician or representative by the Radiologist Assistant, and communication documented in the PACS or zVision Dashboard.     Electronically Signed   By: Richardean Sale M.D.   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 Benay Spice and Dr. Dema Severin after imaging reveals rectal mass concerning for adenocarcinoma.  1.  Personal history of  descending colon cancer status post resection with colostomy/rectal tumor by MRI/possible transverse colon polypoid lesion --he has never had a colonoscopy and needs evaluation of the rectum for biopsy the known mass seen by MRI consistent with colon cancer/rectal cancer.  Colonoscopy also recommended per ostomy after bowel preparation.  We discussed the risk, benefits and alternatives and he is agreeable and wishes to proceed.  We will perform this later this week. --Will need follow-up with Drs. White and Sherrill for treatment (already in place)  2.  Colostomy with prolapse and parastomal hernia --will likely need revision per Dr. Dema Severin.  Will exclude structural lesion in the more proximal colon first.   Cc: Dr. Benay Spice and Dr. Dema Severin

## 2019-02-20 NOTE — Patient Instructions (Signed)
You have been scheduled for a colonoscopy. Please follow written instructions given to you at your visit today.  Please pick up your prep supplies at the pharmacy within the next 1-3 days. If you use inhalers (even only as needed), please bring them with you on the day of your procedure. Your physician has requested that you go to www.startemmi.com and enter the access code given to you at your visit today. This web site gives a general overview about your procedure. However, you should still follow specific instructions given to you by our office regarding your preparation for the procedure.  If you are age 9 or older, your body mass index should be between 23-30. Your Body mass index is 28.15 kg/m. If this is out of the aforementioned range listed, please consider follow up with your Primary Care Provider.  If you are age 66 or younger, your body mass index should be between 19-25. Your Body mass index is 28.15 kg/m. If this is out of the aformentioned range listed, please consider follow up with your Primary Care Provider.

## 2019-02-21 ENCOUNTER — Encounter: Payer: Self-pay | Admitting: General Practice

## 2019-02-21 NOTE — Progress Notes (Signed)
[facility] Psychosocial Distress Screening Clinical Social Work  Clinical Social Work was referred by distress screening protocol.  The patient scored a 7 on the Psychosocial Distress Thermometer which indicates moderate distress. Clinical Social Worker contacted patient by phone to assess for distress and other psychosocial needs. Patient has no phone and is hard of hearing, has difficulty w phone calls.  Daughter Shawn Chambers is main person who checks on patient - patient lives w son who "is never there."  As daughter and sister provide transportation and also work, patient was referred to [facility] for possible help.  Per daughter, patient cannot hear and has significant difficulty w phone messages/calls.  "He missed an important appointment for a colonoscopy because he didn't know he had an appointment."  Requests that all appts be scheduled through daughter Shawn Chambers.  Request conveyed to Nurse Navigator who is already aware that patient is [facility].  Briefly described services available through [facility], encouraged patient to access as needed.    ONCBCN DISTRESS SCREENING 02/20/2019  Screening Type Initial Screening  Distress experienced in past week (1-10) 7  Emotional problem type Depression;Nervousness/Anxiety;Adjusting to illness;Isolation/feeling alone;Feeling hopeless;Boredom;Adjusting to appearance changes  Physical Problem type Pain;Sleep/insomnia;Getting around;Changes in urination;Skin dry/itchy    Clinical Social Worker follow up needed: No.  If yes, follow up plan:  [name], [place], LCSW Clinical Social Worker Phone:  [phone]

## 2019-02-22 ENCOUNTER — Encounter: Payer: Self-pay | Admitting: Internal Medicine

## 2019-02-22 ENCOUNTER — Other Ambulatory Visit: Payer: Self-pay

## 2019-02-22 ENCOUNTER — Ambulatory Visit (AMBULATORY_SURGERY_CENTER): Payer: Medicare HMO | Admitting: Internal Medicine

## 2019-02-22 VITALS — BP 112/63 | HR 59 | Temp 98.4°F | Resp 13 | Ht 68.0 in | Wt 185.0 lb

## 2019-02-22 DIAGNOSIS — Z85038 Personal history of other malignant neoplasm of large intestine: Secondary | ICD-10-CM

## 2019-02-22 DIAGNOSIS — D122 Benign neoplasm of ascending colon: Secondary | ICD-10-CM | POA: Diagnosis not present

## 2019-02-22 DIAGNOSIS — D123 Benign neoplasm of transverse colon: Secondary | ICD-10-CM | POA: Diagnosis not present

## 2019-02-22 DIAGNOSIS — C20 Malignant neoplasm of rectum: Secondary | ICD-10-CM

## 2019-02-22 MED ORDER — SODIUM CHLORIDE 0.9 % IV SOLN: 500.0000 mL | Freq: Once | INTRAVENOUS | Status: DC

## 2019-02-22 NOTE — Progress Notes (Signed)
Report to PACU, RN, vss, BBS= Clear.  

## 2019-02-22 NOTE — Patient Instructions (Signed)
Follow up with Surgery and Oncology.  AVOID ALL NSAIDS  (Aspirin, Ibuprofen, Naproxen Sodium/Aleve,) for the NEXT 3 WEEKS.  Repeat colonoscopy in 3 months via stoma to review the previous polyp sites and perform additional removal of all polyps.  Resume previous diet and other medications today.  Return to your normal activities tomorrow.        YOU HAD AN ENDOSCOPIC PROCEDURE TODAY AT [facility]:   Refer to the procedure report that was given to you for any specific questions about what was found during the examination.  If the procedure report does not answer your questions, please call your gastroenterologist to clarify.  If you requested that your care partner not be given the details of your procedure findings, then the procedure report has been included in a sealed envelope for you to review at your convenience later.  YOU SHOULD EXPECT: Some feelings of bloating in the abdomen. Passage of more gas than usual.  Walking can help get rid of the air that was put into your GI tract during the procedure and reduce the bloating. If you had a lower endoscopy (such as a colonoscopy or flexible sigmoidoscopy) you may notice spotting of blood in your stool or on the toilet paper. If you underwent a bowel prep for your procedure, you may not have a normal bowel movement for a few days.  Please Note:  You might notice some irritation and congestion in your nose or some drainage.  This is from the oxygen used during your procedure.  There is no need for concern and it should clear up in a day or so.  SYMPTOMS TO REPORT IMMEDIATELY:   Following lower endoscopy (colonoscopy or flexible sigmoidoscopy):  Excessive amounts of blood in the stool  Significant tenderness or worsening of abdominal pains  Swelling of the abdomen that is new, acute  Fever of 100F or higher  \ For urgent or emergent issues, a gastroenterologist can be reached at any hour by calling [phone]  [phone].   DIET:  We do recommend a small meal at first, but then you may proceed to your regular diet.  Drink plenty of fluids but you should avoid alcoholic beverages for 24 hours.  ACTIVITY:  You should plan to take it easy for the rest of today and you should NOT DRIVE or use heavy machinery until tomorrow (because of the sedation medicines used during the test).    FOLLOW UP: Our staff will call the number listed on your records the next business day following your procedure to check on you and address any questions or concerns that you may have regarding the information given to you following your procedure. If we do not reach you, we will leave a message.  However, if you are feeling well and you are not experiencing any problems, there is no need to return our call.  We will assume that you have returned to your regular daily activities without incident.  If any biopsies were taken you will be contacted by phone or by letter within the next 1-3 weeks.  Please call us at [phone] if you have not heard about the biopsies in 3 weeks.    SIGNATURES/CONFIDENTIALITY: You and/or your care partner have signed paperwork which will be entered into your electronic medical record.  These signatures attest to the fact that that the information above on your After Visit Summary has been reviewed and is understood.  Full responsibility of the confidentiality of this discharge  information lies with you and/or your care-partner. 

## 2019-02-22 NOTE — Op Note (Signed)
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 The patient's tolerance of                            previous anesthesia was also reviewed. The risks                            and benefits of the procedure and the sedation                            options and risks were discussed with the patient.                            All questions were answered, and informed consent                            was obtained. Prior Anticoagulants: The patient has                            taken no previous anticoagulant or antiplatelet  agents. ASA Grade Assessment: III - A patient with                            severe systemic disease. After reviewing the risks                            and benefits, the patient was deemed in                            satisfactory condition to undergo the procedure.                           After obtaining informed consent, the colonoscope                            was passed under direct vision.  Throughout the                            procedure, the patient's blood pressure, pulse, and                            oxygen saturations were monitored continuously. The                            Colonoscope was introduced through the anus and                            advanced to the terminal ileum. The colonoscopy was                            somewhat difficult. The patient tolerated the                            procedure well. The quality of the bowel                            preparation was good. The terminal ileum, ileocecal                            valve, appendiceal orifice, and rectum were                            photographed. Scope In: 11:45:58 AM Scope Out: 12:50:30 PM Scope Withdrawal Time: 0 hours 51 minutes 12 seconds  Total Procedure Duration: 1 hour 4 minutes 32 seconds  Findings:                 The digital rectal exam revealed a rectal mass.                           Three pedunculated and sessile polyps were found in                            the mid to distal rectum. The polyps were 10 to 30  mm in size. Polypectomy was not attempted due to                            the identification of cancer in the proximal rectum.                           A fungating and ulcerated completely obstructing                            large mass was found in the proximal rectum. The                            mass was circumferential and unable to be                            traversed. Oozing was present. This was biopsied                            with a cold forceps for histology.                           There was evidence of a patent end colostomy at the                            splenic flexure. This was characterized by                            prolapsed colon and peristomal hernia. After                            several passes with the colonoscope through the                            ostomy the herniated colon was reduced.                            Multiple medium-mouthed diverticula were found in                            the cecum.                           Two sessile polyps were found in the ascending                            colon. The polyps were 10 to 15 mm in size. These                            polyps were removed with a hot snare. Resection and                            retrieval were complete.  Three sessile polyps were found in the ascending                            colon. The polyps were 5 to 6 mm in size. These                            polyps were removed with a cold snare. Resection                            and retrieval were complete.                           Five sessile polyps were found in the transverse                            colon. The polyps were 5 to 9 mm in size. These                            polyps were removed with a cold snare. Resection                            and retrieval were complete.                           A 40 mm polyp was found in the transverse colon.                            The polyp was semi-pedunculated. Preparations were                            made for mucosal resection. Saline was injected to                            raise the lesion. Hot snare mucosal resection was                            performed in piecemeal fashion. Resection and                            retrieval were complete. Area distal to the                            polypectomy base was tattooed with an injection of                            4 mL of Spot on opposite walls (carbon black).                           Two sessile polyps were found in the distal                            transverse colon. The polyps were 5 to 10 mm in  size. These polyps were removed with a cold snare.                            Resection and retrieval were complete.                           Two sessile polyps were found in the distal                             transverse colon very near the ostomy. The polyps                            were 8 to 10 mm in size. Polypectomy was initially                            attempted however due to spasm they were unable to                            be removed today. We will plan removal at                            surveillance. Complications:            No immediate complications. Estimated Blood Loss:     Estimated blood loss was minimal. Impression:               - Rectal mass by DRE.                           - Three 10 to 30 mm polyps in the mid and distal                            rectum. Resection not attempted due to the presence                            of rectal tumor more proximally.                           - Malignant completely obstructing tumor in the                            proximal rectum. Biopsied.                           - Patent end colostomy with prolapsed colon and                            peristomal hernia. After several passes with the                            colonoscope the prolapsed colon was reduced.                           - Diverticulosis in the cecum.                           -  Two 10 to 15 mm polyps in the ascending colon,                            removed with a hot snare. Resected and retrieved.                           - Three 5 to 6 mm polyps in the ascending colon,                            removed with a cold snare. Resected and retrieved.                           - Five 5 to 9 mm polyps in the transverse colon,                            removed with a cold snare. Resected and retrieved.                           - One 40 mm polyp in the transverse colon, removed                            with mucosal resection. Resected and retrieved.                            Tattooed.                           - Two 5 to 10 mm polyps in the distal transverse                            colon, removed with a cold snare. Resected and                             retrieved.                           - Two 8 to 10 mm polyps in the distal transverse                            colon. Resection not performed today. Recommendation:           - Patient has a contact number available for                            emergencies. The signs and symptoms of potential                            delayed complications were discussed with the                            patient. Return to normal activities tomorrow.  Written discharge instructions were provided to the                            patient.                           - Resume previous diet.                           - Continue present medications.                           - No aspirin, ibuprofen, naproxen, or other                            non-steroidal anti-inflammatory drugs for 3 weeks                            after polyp removal.                           - Await pathology results.                           - Repeat colonoscopy via stoma in 6 months to                            review the polypectomy site perform additional                            polypectomies.                           - Surgical and oncologic follow-up. Jerene Bears, MD 02/22/2019 1:16:07 PM This report has been signed electronically.

## 2019-02-23 ENCOUNTER — Telehealth: Payer: Self-pay | Admitting: *Deleted

## 2019-02-23 NOTE — Telephone Encounter (Signed)
  Follow up Call-  Call back number 02/22/2019  Post procedure Call Back phone  # [phone] (patient's sister)  Permission to leave phone message Yes     Patient questions:  Do you have a fever, pain , or abdominal swelling? No. Pain Score  0 *  Have you tolerated food without any problems? Yes.    Have you been able to return to your normal activities? Yes.    Do you have any questions about your discharge instructions: Diet   No. Medications  No. Follow up visit  No.  Do you have questions or concerns about your Care? No.  Actions: * If pain score is 4 or above: No action needed, pain <4.

## 2019-03-01 ENCOUNTER — Ambulatory Visit
Admission: RE | Admit: 2019-03-01 | Discharge: 2019-03-01 | Disposition: A | Discharge status: Home / Self Care | Payer: Medicare HMO | Source: Ambulatory Visit | Attending: Radiation Oncology | Admitting: Radiation Oncology

## 2019-03-01 ENCOUNTER — Encounter: Payer: Self-pay | Admitting: Nurse Practitioner

## 2019-03-01 ENCOUNTER — Inpatient Hospital Stay (HOSPITAL_BASED_OUTPATIENT_CLINIC_OR_DEPARTMENT_OTHER): Payer: Medicare HMO | Admitting: Nurse Practitioner

## 2019-03-01 ENCOUNTER — Telehealth: Payer: Self-pay | Admitting: Nurse Practitioner

## 2019-03-01 ENCOUNTER — Other Ambulatory Visit: Payer: Self-pay

## 2019-03-01 VITALS — BP 150/75 | HR 75 | Temp 98.4°F | Resp 19 | Ht 68.0 in | Wt 182.1 lb

## 2019-03-01 DIAGNOSIS — Z510 Encounter for antineoplastic radiation therapy: Secondary | ICD-10-CM | POA: Diagnosis not present

## 2019-03-01 DIAGNOSIS — Z9049 Acquired absence of other specified parts of digestive tract: Secondary | ICD-10-CM | POA: Diagnosis not present

## 2019-03-01 DIAGNOSIS — Z79899 Other long term (current) drug therapy: Secondary | ICD-10-CM | POA: Insufficient documentation

## 2019-03-01 DIAGNOSIS — R152 Fecal urgency: Secondary | ICD-10-CM

## 2019-03-01 DIAGNOSIS — Z7982 Long term (current) use of aspirin: Secondary | ICD-10-CM | POA: Insufficient documentation

## 2019-03-01 DIAGNOSIS — C20 Malignant neoplasm of rectum: Secondary | ICD-10-CM | POA: Diagnosis not present

## 2019-03-01 DIAGNOSIS — Z85038 Personal history of other malignant neoplasm of large intestine: Secondary | ICD-10-CM

## 2019-03-01 DIAGNOSIS — Z905 Acquired absence of kidney: Secondary | ICD-10-CM | POA: Insufficient documentation

## 2019-03-01 DIAGNOSIS — Z87891 Personal history of nicotine dependence: Secondary | ICD-10-CM | POA: Insufficient documentation

## 2019-03-01 DIAGNOSIS — C186 Malignant neoplasm of descending colon: Secondary | ICD-10-CM

## 2019-03-01 DIAGNOSIS — Z933 Colostomy status: Secondary | ICD-10-CM

## 2019-03-01 NOTE — Progress Notes (Addendum)
[place] OFFICE PROGRESS NOTE   Diagnosis: Rectal cancer, history of colon cancer  INTERVAL HISTORY:   Mr. Voeltz returns as scheduled.  He continues to have rectal frequency/urgency.  He also has rectal discomfort.  He notes intermittent rectal bleeding.  Objective:  Vital signs in last 24 hours:  Blood pressure (!) 150/75, pulse 75, temperature 98.4 F (36.9 C), temperature source Oral, resp. rate 19, height '5\' 8"'  (1.727 m), weight 182 lb 1.6 oz (82.6 kg), SpO2 98 %.    HEENT: No thrush or ulcers. Resp: Lungs clear bilaterally. Cardio: Regular rate and rhythm. GI: Abdomen soft and nontender.  No hepatomegaly.  Left lower quadrant colostomy with prolapsed bowel. Vascular: No leg edema.   Lab Results:  Lab Results  Component Value Date   WBC 6.6 02/19/2019   HGB 12.3 (L) 02/19/2019   HCT 37.7 (L) 02/19/2019   MCV 89.5 02/19/2019   PLT 159 02/19/2019   NEUTROABS 4.5 02/19/2019    Imaging:  No results found.  Medications: I have reviewed the patient's current medications.  Assessment/Plan: 1. Colon cancer, descending colon, stage II (T3 N0), status post a partial left colectomy 03/28/2017 ? MSI-stable, no loss of mismatch repair protein expression ? CT abdomen/pelvis 03/26/2017 and CT chest 03/27/2017-no evidence metastatic disease, abdomen/pelvis CT-long segment of bowel wall thickening in the left colon, partial mechanical obstruction by the area of thickening in the left colon, solid enhancing left renal lesion ? Elevated preoperative CEA ? CT 11/15/2018- irregular rectal wall thickening/perirectal stranding, polypoid defect in the mid transverse colon proximal to the colostomy-indeterminate, nodularity of the prostate with a hyperdense component in the median lobe-stable ? MRI pelvis 02/01/2019- T3BN1 rectal tumor at 4.5 cm from the internal anal sphincter, 11.5 cm in length ? Colonoscopy 02/22/2019- malignant completely obstructing tumor in the  proximal rectum.  Polyps in the mid and distal rectum; polyps in the ascending, transverse/distal transverse colon.  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  Rectum biopsy shows high-grade dysplasia with focal features suspicious for adenocarcinoma.   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   BPH  4.   History of tobacco use  5.   History of an irregular mole at the right upper back  Disposition: Mr. Bordonaro appears to have a locally advanced rectal cancer.  Dr. Benay Spice discussed the colonoscopy findings with Mr. Snedden and his daughter and recommends neoadjuvant radiation/Xeloda.  We reviewed potential toxicities associated with Xeloda including nausea, mouth sores, diarrhea, hand-foot syndrome, skin rash, increased sensitivity to sun, skin hyperpigmentation.  He agrees to proceed with radiation/Xeloda.  He is scheduled for radiation simulation today.  He will attend a chemotherapy education class and we will obtain baseline labs to include a CBC, chemistry panel and CEA.  Radiation is scheduled to begin 03/06/2019.  We will plan to see him in follow-up the 03/16/2019.  He will contact the office in the interim with any problems.  Patient seen with Dr. Benay Spice.  25 minutes were spent face-to-face at today's visit with the majority of that time involved in counseling/coordination of care.     [name] ANP/GNP-BC   03/01/2019  12:15 PM Mr. Whiteford has clinical stage III rectal cancer.  He has multiple adenomatous polyps.  We discussed treatment options with Mr. Oriordan and his daughter.  I recommend proceeding with neoadjuvant Xeloda/radiation.  I explained the rationale for neoadjuvant therapy.  We discussed the benefits of chemotherapy and radiation in patients with locally advanced rectal cancer.  We reviewed toxicities associated with this treatment.  Mr. Heber agrees to proceed.  We anticipate beginning concurrent chemotherapy and radiation  on 03/06/2019.  [name], MD

## 2019-03-01 NOTE — Telephone Encounter (Signed)
Scheduled appt per 3/19 los. ° °Printed calendar and avs. °

## 2019-03-02 ENCOUNTER — Telehealth: Payer: Self-pay

## 2019-03-02 ENCOUNTER — Telehealth: Payer: Self-pay | Admitting: Pharmacist

## 2019-03-02 DIAGNOSIS — C186 Malignant neoplasm of descending colon: Secondary | ICD-10-CM

## 2019-03-02 MED ORDER — CAPECITABINE 500 MG PO TABS: ORAL_TABLET | ORAL | 0 refills | Status: DC

## 2019-03-02 NOTE — Telephone Encounter (Signed)
Oral Chemotherapy Pharmacist Encounter   I spoke with patient's daughter, [name], for overview of: Xeloda (capecitabine) for the neoadjuvant treatment of locally advanced rectal cancer in conjunction with radiation, planned duration 5-1/2-week course of radiation (28 total treatments over 38 calendar days).  Counseled on administration, dosing, side effects, monitoring, drug-food interactions, safe handling, storage, and disposal.  Patient will take Xeloda 500mg  tablets, 3 tablets (1500mg ) by mouth in AM and 3 tabs (1500mg ) by mouth in PM, within 30 minutes of finishing meals, on days of radiation only.  Xeloda and radiation start date: 03/06/2019  Adverse effects of Xeloda include but are not limited to: fatigue, decreased blood counts, GI upset, diarrhea, mouth sores, and hand-foot syndrome.  They will alert the office if anti-emetic is needed because nausea develops. We discussed ways to manage colostomy output during concurrent chemoradiation course.  Reviewed importance of keeping a medication schedule and plan for any missed doses.  Vicky voiced understanding and appreciation.   We extensively discussed coronavirus outbreak and patient's risk of infection as Vicky works for the [employer] and is extremely concerned about contracting the virus and giving it to her Dad.  All questions answered.  Medication reconciliation performed and medication/allergy list updated.  Insurance authorization for Xeloda has been submitted. Once it is approved test claim will be run at the pharmacy and we will follow-up with patient and daughter with copayment information to further discuss medication acquisition details.  They know to call the office with questions or concerns. Oral Oncology Clinic will continue to follow.  Johny Drilling, PharmD, BCPS, BCOP  03/02/2019  3:55 PM Oral Oncology Clinic [phone]

## 2019-03-02 NOTE — Telephone Encounter (Signed)
Oral Oncology Pharmacist Encounter  Received new prescription for Xeloda (capecitabine) for the neoadjuvant treatment of locally advanced rectal cancer in conjunction with radiation, planned duration 5-1/2-week course of radiation (28 total treatments over 38 calendar days).  Labs from 02/19/2019 assessed, okay for treatment.  Xeloda is planned to be dosed at ~750 mg/m2 by mouth twice daily on days of radiation only, Monday through Friday.  Current medication list in Epic reviewed, no DDIs with Xeloda identified.  Prescription has been e-scribed to the [facility] for benefits analysis and approval.  Oral Oncology Clinic will continue to follow for insurance authorization, copayment issues, initial counseling and start date.  Johny Drilling, PharmD, BCPS, BCOP  03/02/2019 9:10 AM Oral Oncology Clinic [phone]

## 2019-03-02 NOTE — Telephone Encounter (Signed)
Oral Oncology Pharmacist Encounter  I called patient's daughter, [name], to discuss details of Xeloda administration and to provide update on medication acquisition. Vicky requests call back later this afternoon. I will plan to call Vicky at 3:30 today.  Johny Drilling, PharmD, BCPS, BCOP  03/02/2019 2:02 PM Oral Oncology Clinic [phone]

## 2019-03-02 NOTE — Telephone Encounter (Signed)
Oral Oncology Patient Advocate Encounter  Received notification from [facility] that prior authorization for Xeloda is required.  PA submitted on CoverMyMeds Key [id number] Status is pending  Oral Oncology Clinic will continue to follow.  [place] Patient Shawn Chambers Phone [phone] Fax [phone] 03/02/2019    12:26 PM

## 2019-03-05 ENCOUNTER — Telehealth: Payer: Self-pay

## 2019-03-05 ENCOUNTER — Other Ambulatory Visit: Payer: Medicare HMO

## 2019-03-05 ENCOUNTER — Inpatient Hospital Stay: Payer: Medicare HMO

## 2019-03-05 ENCOUNTER — Encounter: Payer: Self-pay | Admitting: *Deleted

## 2019-03-05 ENCOUNTER — Other Ambulatory Visit: Payer: Self-pay

## 2019-03-05 ENCOUNTER — Other Ambulatory Visit: Payer: Self-pay | Admitting: Nurse Practitioner

## 2019-03-05 DIAGNOSIS — C186 Malignant neoplasm of descending colon: Secondary | ICD-10-CM

## 2019-03-05 DIAGNOSIS — C20 Malignant neoplasm of rectum: Secondary | ICD-10-CM | POA: Diagnosis not present

## 2019-03-05 LAB — CBC WITH DIFFERENTIAL (CANCER CENTER ONLY)
Abs Immature Granulocytes: 0.01 10*3/uL (ref 0.00–0.07)
Basophils Absolute: 0 10*3/uL (ref 0.0–0.1)
Basophils Relative: 1 %
EOS ABS: 0.1 10*3/uL (ref 0.0–0.5)
Eosinophils Relative: 2 %
HCT: 39.3 % (ref 39.0–52.0)
Hemoglobin: 12.8 g/dL — ABNORMAL LOW (ref 13.0–17.0)
Immature Granulocytes: 0 %
Lymphocytes Relative: 20 %
Lymphs Abs: 1.4 10*3/uL (ref 0.7–4.0)
MCH: 29.4 pg (ref 26.0–34.0)
MCHC: 32.6 g/dL (ref 30.0–36.0)
MCV: 90.3 fL (ref 80.0–100.0)
Monocytes Absolute: 0.7 10*3/uL (ref 0.1–1.0)
Monocytes Relative: 10 %
Neutro Abs: 4.4 10*3/uL (ref 1.7–7.7)
Neutrophils Relative %: 67 %
Platelet Count: 177 10*3/uL (ref 150–400)
RBC: 4.35 MIL/uL (ref 4.22–5.81)
RDW: 12.9 % (ref 11.5–15.5)
WBC Count: 6.6 10*3/uL (ref 4.0–10.5)
nRBC: 0 % (ref 0.0–0.2)

## 2019-03-05 LAB — CMP (CANCER CENTER ONLY)
ALK PHOS: 67 U/L (ref 38–126)
ALT: 12 U/L (ref 0–44)
AST: 19 U/L (ref 15–41)
Albumin: 4 g/dL (ref 3.5–5.0)
Anion gap: 9 (ref 5–15)
BUN: 15 mg/dL (ref 8–23)
CALCIUM: 9.6 mg/dL (ref 8.9–10.3)
CO2: 26 mmol/L (ref 22–32)
CREATININE: 1.36 mg/dL — AB (ref 0.61–1.24)
Chloride: 107 mmol/L (ref 98–111)
GFR, Est AFR Am: 57 mL/min — ABNORMAL LOW (ref >60)
GFR, Estimated: 49 mL/min — ABNORMAL LOW (ref >60)
Glucose, Bld: 99 mg/dL (ref 70–99)
Potassium: 5.4 mmol/L — ABNORMAL HIGH (ref 3.5–5.1)
Sodium: 142 mmol/L (ref 135–145)
Total Bilirubin: 0.5 mg/dL (ref 0.3–1.2)
Total Protein: 7.2 g/dL (ref 6.5–8.1)

## 2019-03-05 LAB — CEA (IN HOUSE-CHCC): CEA (CHCC-In House): 108.74 ng/mL — ABNORMAL HIGH (ref 0.00–5.00)

## 2019-03-05 MED FILL — CAPECITABINE 500 MG TABS: 500 | 29 days supply | Qty: 132 | Fill #0

## 2019-03-05 NOTE — Telephone Encounter (Signed)
Oral Oncology Patient Advocate Encounter  Received notification from [facility] that the request for prior authorization for Xeloda has been denied because it needs to be billed to [place] Patient [place] Phone [phone] Fax [phone] 03/05/2019    8:11 AM

## 2019-03-05 NOTE — Telephone Encounter (Signed)
Patient's daughter called worried that she might not be able to attend chemo ed class with her father who is hard of hearing and poor historian due to visitor restrictions. Message sent to [name] RN letting her know of the situation. Daughter, Shawn Chambers, had multiple questions about treatment. Encouraged her that nurse educator would give specific, detailed information. Encouraged to call me back with further questions or concerns. Will follow up after first radiation appointment.

## 2019-03-05 NOTE — Telephone Encounter (Signed)
Oral Oncology Patient Advocate Encounter  Confirmed with [place] that Xeloda was picked up on 03/05/19 with a $26.40 copay.   [place] Patient [place] Phone [phone] Fax [phone] 03/05/2019   2:23 PM

## 2019-03-06 ENCOUNTER — Ambulatory Visit
Admission: RE | Admit: 2019-03-06 | Discharge: 2019-03-06 | Disposition: A | Discharge status: Home / Self Care | Payer: Medicare HMO | Source: Ambulatory Visit | Attending: Radiation Oncology | Admitting: Radiation Oncology

## 2019-03-06 ENCOUNTER — Telehealth: Payer: Self-pay | Admitting: Oncology

## 2019-03-06 DIAGNOSIS — Z510 Encounter for antineoplastic radiation therapy: Secondary | ICD-10-CM | POA: Diagnosis not present

## 2019-03-06 NOTE — Telephone Encounter (Signed)
Scheduled appt per 3/23 sch message - left message with appt date and time

## 2019-03-07 ENCOUNTER — Ambulatory Visit
Admission: RE | Admit: 2019-03-07 | Discharge: 2019-03-07 | Disposition: A | Discharge status: Home / Self Care | Payer: Medicare HMO | Source: Ambulatory Visit | Attending: Radiation Oncology | Admitting: Radiation Oncology

## 2019-03-07 ENCOUNTER — Other Ambulatory Visit: Payer: Self-pay

## 2019-03-07 DIAGNOSIS — Z510 Encounter for antineoplastic radiation therapy: Secondary | ICD-10-CM | POA: Diagnosis not present

## 2019-03-08 ENCOUNTER — Inpatient Hospital Stay: Payer: Medicare HMO

## 2019-03-08 ENCOUNTER — Other Ambulatory Visit: Payer: Self-pay

## 2019-03-08 ENCOUNTER — Other Ambulatory Visit: Payer: Medicare HMO

## 2019-03-08 ENCOUNTER — Telehealth: Payer: Self-pay | Admitting: Oncology

## 2019-03-08 ENCOUNTER — Telehealth: Payer: Self-pay | Admitting: *Deleted

## 2019-03-08 ENCOUNTER — Ambulatory Visit
Admission: RE | Admit: 2019-03-08 | Discharge: 2019-03-08 | Disposition: A | Discharge status: Home / Self Care | Payer: Medicare HMO | Source: Ambulatory Visit | Attending: Radiation Oncology | Admitting: Radiation Oncology

## 2019-03-08 DIAGNOSIS — Z510 Encounter for antineoplastic radiation therapy: Secondary | ICD-10-CM | POA: Diagnosis not present

## 2019-03-08 NOTE — Telephone Encounter (Signed)
VM from family member requesting more information on the labs Dr. Benay Spice ordered. Noted that patient did not go to lab today. Called back and left VM with [name] the K+ elevation and potential complications of this as well as the elevated creatinine. Need to ensure his kidneys are working adequately to take the Xeloda at current dose. Strongly encouraged them to have lab tomorrow. High priority scheduling message sent for lab tomorrow.

## 2019-03-08 NOTE — Telephone Encounter (Signed)
Left message for patient with appt date and time per 3/26 sch message for lab 3/27

## 2019-03-09 ENCOUNTER — Inpatient Hospital Stay: Payer: Medicare HMO

## 2019-03-09 ENCOUNTER — Telehealth: Payer: Self-pay | Admitting: *Deleted

## 2019-03-09 ENCOUNTER — Other Ambulatory Visit: Payer: Self-pay

## 2019-03-09 ENCOUNTER — Ambulatory Visit
Admission: RE | Admit: 2019-03-09 | Discharge: 2019-03-09 | Disposition: A | Discharge status: Home / Self Care | Payer: Medicare HMO | Source: Ambulatory Visit | Attending: Radiation Oncology | Admitting: Radiation Oncology

## 2019-03-09 DIAGNOSIS — C186 Malignant neoplasm of descending colon: Secondary | ICD-10-CM

## 2019-03-09 DIAGNOSIS — Z510 Encounter for antineoplastic radiation therapy: Secondary | ICD-10-CM | POA: Diagnosis not present

## 2019-03-09 DIAGNOSIS — C20 Malignant neoplasm of rectum: Secondary | ICD-10-CM | POA: Diagnosis not present

## 2019-03-09 LAB — BASIC METABOLIC PANEL - CANCER CENTER ONLY
Anion gap: 9 (ref 5–15)
BUN: 20 mg/dL (ref 8–23)
CO2: 27 mmol/L (ref 22–32)
Calcium: 9.3 mg/dL (ref 8.9–10.3)
Chloride: 105 mmol/L (ref 98–111)
Creatinine: 1.32 mg/dL — ABNORMAL HIGH (ref 0.61–1.24)
GFR, Est AFR Am: 59 mL/min — ABNORMAL LOW (ref >60)
GFR, Estimated: 51 mL/min — ABNORMAL LOW (ref >60)
Glucose, Bld: 114 mg/dL — ABNORMAL HIGH (ref 70–99)
POTASSIUM: 4.6 mmol/L (ref 3.5–5.1)
Sodium: 141 mmol/L (ref 135–145)

## 2019-03-09 NOTE — Telephone Encounter (Signed)
Notified daughter via VM that creatinine is improved and K+ is now normal. Per Dr. Benay Spice, continue same Xeloda dosing and return 4/3 for lab/OV as scheduled.

## 2019-03-12 ENCOUNTER — Other Ambulatory Visit: Payer: Self-pay

## 2019-03-12 ENCOUNTER — Ambulatory Visit
Admission: RE | Admit: 2019-03-12 | Discharge: 2019-03-12 | Disposition: A | Discharge status: Home / Self Care | Payer: Medicare HMO | Source: Ambulatory Visit | Attending: Radiation Oncology | Admitting: Radiation Oncology

## 2019-03-12 DIAGNOSIS — Z510 Encounter for antineoplastic radiation therapy: Secondary | ICD-10-CM | POA: Diagnosis not present

## 2019-03-13 ENCOUNTER — Ambulatory Visit
Admission: RE | Admit: 2019-03-13 | Discharge: 2019-03-13 | Disposition: A | Discharge status: Home / Self Care | Payer: Medicare HMO | Source: Ambulatory Visit | Attending: Radiation Oncology | Admitting: Radiation Oncology

## 2019-03-13 ENCOUNTER — Other Ambulatory Visit: Payer: Self-pay

## 2019-03-13 DIAGNOSIS — Z510 Encounter for antineoplastic radiation therapy: Secondary | ICD-10-CM | POA: Diagnosis not present

## 2019-03-14 ENCOUNTER — Other Ambulatory Visit: Payer: Self-pay

## 2019-03-14 ENCOUNTER — Ambulatory Visit
Admission: RE | Admit: 2019-03-14 | Discharge: 2019-03-14 | Disposition: A | Discharge status: Home / Self Care | Payer: Medicare HMO | Source: Ambulatory Visit | Attending: Radiation Oncology | Admitting: Radiation Oncology

## 2019-03-14 DIAGNOSIS — Z510 Encounter for antineoplastic radiation therapy: Secondary | ICD-10-CM | POA: Diagnosis not present

## 2019-03-14 DIAGNOSIS — C20 Malignant neoplasm of rectum: Secondary | ICD-10-CM | POA: Diagnosis not present

## 2019-03-14 DIAGNOSIS — Z7982 Long term (current) use of aspirin: Secondary | ICD-10-CM | POA: Insufficient documentation

## 2019-03-14 DIAGNOSIS — Z87891 Personal history of nicotine dependence: Secondary | ICD-10-CM | POA: Diagnosis not present

## 2019-03-14 DIAGNOSIS — Z79899 Other long term (current) drug therapy: Secondary | ICD-10-CM | POA: Insufficient documentation

## 2019-03-14 DIAGNOSIS — Z905 Acquired absence of kidney: Secondary | ICD-10-CM | POA: Insufficient documentation

## 2019-03-14 DIAGNOSIS — Z9049 Acquired absence of other specified parts of digestive tract: Secondary | ICD-10-CM | POA: Insufficient documentation

## 2019-03-15 ENCOUNTER — Other Ambulatory Visit: Payer: Self-pay

## 2019-03-15 ENCOUNTER — Ambulatory Visit
Admission: RE | Admit: 2019-03-15 | Discharge: 2019-03-15 | Disposition: A | Discharge status: Home / Self Care | Payer: Medicare HMO | Source: Ambulatory Visit | Attending: Radiation Oncology | Admitting: Radiation Oncology

## 2019-03-15 DIAGNOSIS — Z510 Encounter for antineoplastic radiation therapy: Secondary | ICD-10-CM | POA: Diagnosis not present

## 2019-03-16 ENCOUNTER — Ambulatory Visit
Admission: RE | Admit: 2019-03-16 | Discharge: 2019-03-16 | Disposition: A | Discharge status: Home / Self Care | Payer: Medicare HMO | Source: Ambulatory Visit | Attending: Radiation Oncology | Admitting: Radiation Oncology

## 2019-03-16 ENCOUNTER — Encounter: Payer: Self-pay | Admitting: Oncology

## 2019-03-16 ENCOUNTER — Inpatient Hospital Stay (HOSPITAL_BASED_OUTPATIENT_CLINIC_OR_DEPARTMENT_OTHER): Payer: Medicare HMO | Admitting: Oncology

## 2019-03-16 ENCOUNTER — Telehealth: Payer: Self-pay | Admitting: Oncology

## 2019-03-16 ENCOUNTER — Inpatient Hospital Stay: Payer: Medicare HMO | Attending: Oncology

## 2019-03-16 ENCOUNTER — Other Ambulatory Visit: Payer: Self-pay

## 2019-03-16 ENCOUNTER — Encounter

## 2019-03-16 ENCOUNTER — Ambulatory Visit: Payer: Medicare HMO | Admitting: Gastroenterology

## 2019-03-16 VITALS — BP 110/52 | HR 68 | Temp 97.9°F | Resp 18 | Ht 68.0 in | Wt 182.2 lb

## 2019-03-16 DIAGNOSIS — Z933 Colostomy status: Secondary | ICD-10-CM | POA: Diagnosis not present

## 2019-03-16 DIAGNOSIS — C20 Malignant neoplasm of rectum: Secondary | ICD-10-CM | POA: Diagnosis not present

## 2019-03-16 DIAGNOSIS — L539 Erythematous condition, unspecified: Secondary | ICD-10-CM | POA: Insufficient documentation

## 2019-03-16 DIAGNOSIS — C186 Malignant neoplasm of descending colon: Secondary | ICD-10-CM | POA: Diagnosis present

## 2019-03-16 DIAGNOSIS — Z9049 Acquired absence of other specified parts of digestive tract: Secondary | ICD-10-CM | POA: Insufficient documentation

## 2019-03-16 DIAGNOSIS — Z87891 Personal history of nicotine dependence: Secondary | ICD-10-CM

## 2019-03-16 DIAGNOSIS — R970 Elevated carcinoembryonic antigen [CEA]: Secondary | ICD-10-CM | POA: Diagnosis not present

## 2019-03-16 DIAGNOSIS — N400 Enlarged prostate without lower urinary tract symptoms: Secondary | ICD-10-CM

## 2019-03-16 DIAGNOSIS — Z905 Acquired absence of kidney: Secondary | ICD-10-CM

## 2019-03-16 DIAGNOSIS — Z510 Encounter for antineoplastic radiation therapy: Secondary | ICD-10-CM | POA: Diagnosis not present

## 2019-03-16 LAB — CBC WITH DIFFERENTIAL (CANCER CENTER ONLY)
Abs Immature Granulocytes: 0.01 10*3/uL (ref 0.00–0.07)
Basophils Absolute: 0 10*3/uL (ref 0.0–0.1)
Basophils Relative: 1 %
Eosinophils Absolute: 0.1 10*3/uL (ref 0.0–0.5)
Eosinophils Relative: 2 %
HCT: 38.8 % — ABNORMAL LOW (ref 39.0–52.0)
Hemoglobin: 12.7 g/dL — ABNORMAL LOW (ref 13.0–17.0)
Immature Granulocytes: 0 %
Lymphocytes Relative: 14 %
Lymphs Abs: 0.7 10*3/uL (ref 0.7–4.0)
MCH: 29.1 pg (ref 26.0–34.0)
MCHC: 32.7 g/dL (ref 30.0–36.0)
MCV: 88.8 fL (ref 80.0–100.0)
Monocytes Absolute: 0.6 10*3/uL (ref 0.1–1.0)
Monocytes Relative: 12 %
Neutro Abs: 3.7 10*3/uL (ref 1.7–7.7)
Neutrophils Relative %: 71 %
Platelet Count: 158 10*3/uL (ref 150–400)
RBC: 4.37 MIL/uL (ref 4.22–5.81)
RDW: 13 % (ref 11.5–15.5)
WBC Count: 5.2 10*3/uL (ref 4.0–10.5)
nRBC: 0 % (ref 0.0–0.2)

## 2019-03-16 NOTE — Progress Notes (Signed)
[place] OFFICE PROGRESS NOTE   Diagnosis: Colon cancer  INTERVAL HISTORY:   Shawn Chambers started concurrent Xeloda and radiation on 03/06/2019.  He reports soreness in the lower abdomen.  He continues to have rectal urgency.  No diarrhea.  He had one mouth sore that has resolved.  No hand or foot pain.  No rash.  Objective:  Vital signs in last 24 hours:  Blood pressure (!) 110/52, pulse 68, temperature 97.9 F (36.6 C), temperature source Oral, resp. rate 18, height '5\' 8"'  (1.727 m), weight 182 lb 3.2 oz (82.6 kg), SpO2 99 %.    HEENT: No thrush or ulcers Cardio: Regular rate and rhythm GI: Left lower quadrant colostomy with formed brown stool, the abdomen is soft and nontender Vascular: No leg edema  Skin: Dryness of the hands, mild erythema at the soles without skin breakdown    Lab Results:  Lab Results  Component Value Date   WBC 5.2 03/16/2019   HGB 12.7 (L) 03/16/2019   HCT 38.8 (L) 03/16/2019   MCV 88.8 03/16/2019   PLT 158 03/16/2019   NEUTROABS 3.7 03/16/2019    CMP  Lab Results  Component Value Date   NA 141 03/09/2019   K 4.6 03/09/2019   CL 105 03/09/2019   CO2 27 03/09/2019   GLUCOSE 114 (H) 03/09/2019   BUN 20 03/09/2019   CREATININE 1.32 (H) 03/09/2019   CALCIUM 9.3 03/09/2019   PROT 7.2 03/05/2019   ALBUMIN 4.0 03/05/2019   AST 19 03/05/2019   ALT 12 03/05/2019   ALKPHOS 67 03/05/2019   BILITOT 0.5 03/05/2019   GFRNONAA 51 (L) 03/09/2019   GFRAA 59 (L) 03/09/2019    Lab Results  Component Value Date   CEA1 108.74 (H) 03/05/2019    Medications: I have reviewed the patient's current medications.   Assessment/Plan: 1. Colon cancer, descending colon, stage II (T3 N0), status post a partial left colectomy 03/28/2017 ? MSI-stable, no loss of mismatch repair protein expression ? CT abdomen/pelvis 03/26/2017 and CT chest 03/27/2017-no evidence metastatic disease, abdomen/pelvis CT-long segment of bowel wall thickening  in the left colon, partial mechanical obstruction by the area of thickening in the left colon, solid enhancing left renal lesion ? Elevated preoperative CEA ? CT 11/15/2018- irregular rectal wall thickening/perirectal stranding, polypoid defect in the mid transverse colon proximal to the colostomy-indeterminate, nodularity of the prostate with a hyperdense component in the median lobe-stable ? MRI pelvis 02/01/2019- T3BN1 rectal tumor at 4.5 cm from the internal anal sphincter, 11.5 cm in length ? Colonoscopy 02/22/2019- malignant completely obstructing tumor in the proximal rectum.  Polyps in the mid and distal rectum; polyps in the ascending, transverse/distal transverse colon.  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  Rectum biopsy shows high-grade dysplasia with focal features suspicious for adenocarcinoma. ? 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   BPH  4.   History of tobacco use  5.   History of an irregular mole at the right upper back   Disposition: Shawn Chambers appears to be tolerating the Xeloda and radiation well.  He has mild erythema at the soles.  He will let us know if the erythema progresses.  He will be scheduled for a lab visit 03/20/2019.  Shawn Chambers will continue Xeloda on the days of radiation.  He will be scheduled for an office visit 03/29/2019.  I discussed the case with his daughter by telephone.  She will let us know  if he develops new symptoms.  Betsy Coder, MD  03/16/2019  9:37 AM

## 2019-03-16 NOTE — Progress Notes (Signed)
Met with patient at registration to introduce myself as Arboriculturist and to offer available resources.  Discussed one-time $58 Engineer, drilling to assist with personal expenses while going through treatment. Patient is a household of 1 and advised what is needed to provide proof of income. Advised he can bring at his next visit if interested in applying. He verbalized understanding.  He has my card for any additional financial questions or concerns.

## 2019-03-16 NOTE — Telephone Encounter (Signed)
Called and scheduled appt per 4/3 los. ° °Patient aware of appt date and time  °

## 2019-03-19 ENCOUNTER — Other Ambulatory Visit: Payer: Self-pay

## 2019-03-19 ENCOUNTER — Ambulatory Visit
Admission: RE | Admit: 2019-03-19 | Discharge: 2019-03-19 | Disposition: A | Discharge status: Home / Self Care | Payer: Medicare HMO | Source: Ambulatory Visit | Attending: Radiation Oncology | Admitting: Radiation Oncology

## 2019-03-19 DIAGNOSIS — Z510 Encounter for antineoplastic radiation therapy: Secondary | ICD-10-CM | POA: Diagnosis not present

## 2019-03-20 ENCOUNTER — Ambulatory Visit
Admission: RE | Admit: 2019-03-20 | Discharge: 2019-03-20 | Disposition: A | Discharge status: Home / Self Care | Payer: Medicare HMO | Source: Ambulatory Visit | Attending: Radiation Oncology | Admitting: Radiation Oncology

## 2019-03-20 ENCOUNTER — Other Ambulatory Visit: Payer: Self-pay

## 2019-03-20 ENCOUNTER — Inpatient Hospital Stay: Payer: Medicare HMO

## 2019-03-20 DIAGNOSIS — C20 Malignant neoplasm of rectum: Secondary | ICD-10-CM | POA: Diagnosis not present

## 2019-03-20 DIAGNOSIS — Z510 Encounter for antineoplastic radiation therapy: Secondary | ICD-10-CM | POA: Diagnosis not present

## 2019-03-20 DIAGNOSIS — C186 Malignant neoplasm of descending colon: Secondary | ICD-10-CM

## 2019-03-20 LAB — CMP (CANCER CENTER ONLY)
ALT: 9 U/L (ref 0–44)
AST: 17 U/L (ref 15–41)
Albumin: 3.9 g/dL (ref 3.5–5.0)
Alkaline Phosphatase: 65 U/L (ref 38–126)
Anion gap: 8 (ref 5–15)
BUN: 18 mg/dL (ref 8–23)
CO2: 27 mmol/L (ref 22–32)
Calcium: 9.3 mg/dL (ref 8.9–10.3)
Chloride: 105 mmol/L (ref 98–111)
Creatinine: 1.31 mg/dL — ABNORMAL HIGH (ref 0.61–1.24)
GFR, Est AFR Am: >60 mL/min (ref >60)
GFR, Estimated: 52 mL/min — ABNORMAL LOW (ref >60)
Glucose, Bld: 98 mg/dL (ref 70–99)
Potassium: 4.1 mmol/L (ref 3.5–5.1)
Sodium: 140 mmol/L (ref 135–145)
Total Bilirubin: 0.5 mg/dL (ref 0.3–1.2)
Total Protein: 6.7 g/dL (ref 6.5–8.1)

## 2019-03-21 ENCOUNTER — Inpatient Hospital Stay (HOSPITAL_BASED_OUTPATIENT_CLINIC_OR_DEPARTMENT_OTHER): Payer: Medicare HMO | Admitting: Medical

## 2019-03-21 ENCOUNTER — Other Ambulatory Visit: Payer: Self-pay

## 2019-03-21 ENCOUNTER — Encounter: Payer: Self-pay | Admitting: Oncology

## 2019-03-21 ENCOUNTER — Ambulatory Visit
Admission: RE | Admit: 2019-03-21 | Discharge: 2019-03-21 | Disposition: A | Discharge status: Home / Self Care | Payer: Medicare HMO | Source: Ambulatory Visit | Attending: Radiation Oncology | Admitting: Radiation Oncology

## 2019-03-21 DIAGNOSIS — Z510 Encounter for antineoplastic radiation therapy: Secondary | ICD-10-CM | POA: Diagnosis not present

## 2019-03-21 DIAGNOSIS — C186 Malignant neoplasm of descending colon: Secondary | ICD-10-CM

## 2019-03-21 DIAGNOSIS — R509 Fever, unspecified: Secondary | ICD-10-CM

## 2019-03-21 NOTE — Progress Notes (Signed)
Patient brought in proof of income for the one-time $700 Tower.  Patient is approved but will come back tomorrow to sign and go over expenses.

## 2019-03-22 ENCOUNTER — Ambulatory Visit
Admission: RE | Admit: 2019-03-22 | Discharge: 2019-03-22 | Disposition: A | Discharge status: Home / Self Care | Payer: Medicare HMO | Source: Ambulatory Visit | Attending: Radiation Oncology | Admitting: Radiation Oncology

## 2019-03-22 ENCOUNTER — Other Ambulatory Visit: Payer: Self-pay

## 2019-03-22 DIAGNOSIS — Z510 Encounter for antineoplastic radiation therapy: Secondary | ICD-10-CM | POA: Diagnosis not present

## 2019-03-22 NOTE — Progress Notes (Signed)
COVID-19 Screening  Shawn Chambers [id number] 08/27/1941 78 y.o.  Meghan Tiemann is managed by Dr. Dominica Severin B. 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  Cancer of the descending colon: The patient continues to be treated with Xeloda and concurrent radiation therapy.  He will proceed with his treatment today.  Please see After Visit Summary for patient specific instructions.  Future Appointments  Date Time Provider Vamo  03/23/2019 11:45 AM [facility] [id number] [facility] None  03/26/2019  9:30 AM [facility] [id number] [facility] None  03/27/2019  9:30 AM [facility] [id number] [facility] None  03/28/2019  9:30 AM [facility] [id number] [facility] None  03/29/2019  9:30 AM [facility] [id number] [facility] None  03/29/2019 11:15 AM [facility] LAB 6 [facility] None  03/29/2019 11:45 AM Owens Shark, NP [facility] None  03/30/2019  9:30 AM [facility] [id number] [facility] None  04/02/2019  9:30 AM [facility] [id number] [facility] None  04/03/2019  9:30 AM [facility] [id number] [facility] None  04/04/2019  9:30 AM [facility] [id number] [facility] None  04/05/2019  9:30 AM [facility] [id number] [facility] None  04/06/2019  9:30 AM [facility] [id number] [facility] None  04/09/2019  9:30 AM [facility] [id number] [facility] None  04/10/2019  9:30 AM [facility] [id number] [facility] None  04/11/2019  9:30 AM [facility] [id number] [facility] None  04/12/2019  9:30 AM [facility] [id number] [facility] None    No orders of the defined types were  placed in this encounter.      Subjective:   Patient ID:  Skylen Spiering is a 78 y.o. (DOB 12-29-1940) male.  Chief Complaint: No chief complaint on file.   HPI Dru Primeau  is a 78 y.o. male who was seen in the waiting area today after he was found to have a temperature of 100.1 as he was being screened prior to his appointment for radiation therapy.  He reports that he has not had a fever, chills, or sweats.  Are you experiencing any of the following:  Shortness of breath:        No Cough :         No Sneezing:         No Rhinorrhea:         No Sore throat:         No Headache:         No Diarrhea:         No Fever:          No  Have you been tested for COVID-19?     No Have you tested positive for COVID-19?     No Have you had contact with anyone testing positive for COVID-19?  No     Allergies: No Known Allergies  Please see review of systems for further details on the patient's review from today.   Review of Systems:  Review of Systems  Constitutional: Negative for chills, diaphoresis and fever.  HENT: Negative for congestion, rhinorrhea, sneezing and sore throat.   Respiratory: Negative for cough and shortness of breath.   Cardiovascular: Negative for chest pain.  Gastrointestinal: Negative for diarrhea.  Neurological: Negative for headaches.    Objective:   Physical Exam:  Vital Signs: Temperature:  Physical Exam Constitutional:      General: He is not in acute distress.    Appearance: Normal appearance. He is not ill-appearing.  HENT:     Head: Normocephalic and atraumatic.  Cardiovascular:     Rate and Rhythm: Normal rate and regular rhythm.     Heart sounds: No murmur. No friction rub. No gallop.   Pulmonary:     Effort: Pulmonary effort is normal. No respiratory distress.     Breath sounds: Normal breath sounds. No wheezing or rales.  Skin:    General: Skin is warm and dry.  Neurological:     Mental Status: He is alert.     Gait: Gait  normal.  Psychiatric:        Mood and Affect: Mood normal.        Thought Content: Thought content normal.        Judgment: Judgment normal.     Lab Review:     Component Value Date/Time   NA 140 03/20/2019 1157   K 4.1 03/20/2019 1157   CL 105 03/20/2019 1157   CO2 27 03/20/2019 1157   GLUCOSE 98 03/20/2019 1157   BUN 18 03/20/2019 1157   CREATININE 1.31 (H) 03/20/2019 1157   CALCIUM 9.3 03/20/2019 1157   PROT 6.7 03/20/2019 1157   ALBUMIN 3.9 03/20/2019 1157   AST 17 03/20/2019 1157   ALT 9 03/20/2019 1157   ALKPHOS 65 03/20/2019 1157   BILITOT 0.5 03/20/2019 1157   GFRNONAA 52 (L) 03/20/2019 1157   GFRAA >60 03/20/2019 1157       Component Value Date/Time   WBC 5.2 03/16/2019 0852   WBC 7.6 04/01/2017 0523   RBC 4.37 03/16/2019 0852   HGB 12.7 (L) 03/16/2019 0852   HCT 38.8 (L) 03/16/2019 0852   PLT 158 03/16/2019 0852   MCV 88.8 03/16/2019 0852   MCH 29.1 03/16/2019 0852   MCHC 32.7 03/16/2019 0852   RDW 13.0 03/16/2019 0852   LYMPHSABS 0.7 03/16/2019 0852   MONOABS 0.6 03/16/2019 0852   EOSABS 0.1 03/16/2019 0852   BASOSABS 0.0 03/16/2019 0852   -------------------------------  Imaging from last 24 hours (if applicable):  Radiology interpretation: No results found.

## 2019-03-23 ENCOUNTER — Ambulatory Visit
Admission: RE | Admit: 2019-03-23 | Discharge: 2019-03-23 | Disposition: A | Discharge status: Home / Self Care | Payer: Medicare HMO | Source: Ambulatory Visit | Attending: Radiation Oncology | Admitting: Radiation Oncology

## 2019-03-23 ENCOUNTER — Other Ambulatory Visit: Payer: Self-pay

## 2019-03-23 DIAGNOSIS — Z510 Encounter for antineoplastic radiation therapy: Secondary | ICD-10-CM | POA: Diagnosis not present

## 2019-03-26 ENCOUNTER — Telehealth: Payer: Self-pay | Admitting: *Deleted

## 2019-03-26 ENCOUNTER — Other Ambulatory Visit: Payer: Self-pay

## 2019-03-26 ENCOUNTER — Encounter: Payer: Self-pay | Admitting: Oncology

## 2019-03-26 ENCOUNTER — Ambulatory Visit
Admission: RE | Admit: 2019-03-26 | Discharge: 2019-03-26 | Disposition: A | Discharge status: Home / Self Care | Payer: Medicare HMO | Source: Ambulatory Visit | Attending: Radiation Oncology | Admitting: Radiation Oncology

## 2019-03-26 DIAGNOSIS — Z510 Encounter for antineoplastic radiation therapy: Secondary | ICD-10-CM | POA: Diagnosis not present

## 2019-03-26 NOTE — Progress Notes (Signed)
Patient's daughter called to go over grant and expense sheet.  She verbalized understanding after explaining in detail.

## 2019-03-26 NOTE — Telephone Encounter (Signed)
Called to report he had a temp of 99.0 today when he checked in and on Friday it was 100.0 and should she be worried. Informed her that both of these are not considered a "fever". Suggested she follow his temp bid at home and call if > 100.5. The do not have a thermometer--instructed her to purchase one today. Call for any obvious source of infection such as burning with urination, cough, congestion, shortness of breath, sudden onset diarrhea and vomiting. Asking when and how we will determine if chemo/RT is working? Informed her that CT scan will most likely be done w/CEA after tx finishes. She has several other questions--encouraged her to write them down and give them to her dad for his appointment on 03/29/19 and we will call her with answers after the visit.

## 2019-03-26 NOTE — Progress Notes (Signed)
Obtained signature from patient for one-time $700 [place].  Patient approved for the grant. Gave him a copy of the approval letter as well as the expense sheet and his original income documentation. Patient received a gas card today from his grant.   Gave my card for any additional financial questions or concerns. He will have his daughter to call with further questions.

## 2019-03-27 ENCOUNTER — Other Ambulatory Visit: Payer: Self-pay

## 2019-03-27 ENCOUNTER — Ambulatory Visit
Admission: RE | Admit: 2019-03-27 | Discharge: 2019-03-27 | Disposition: A | Discharge status: Home / Self Care | Payer: Medicare HMO | Source: Ambulatory Visit | Attending: Radiation Oncology | Admitting: Radiation Oncology

## 2019-03-27 DIAGNOSIS — Z510 Encounter for antineoplastic radiation therapy: Secondary | ICD-10-CM | POA: Diagnosis not present

## 2019-03-28 ENCOUNTER — Other Ambulatory Visit: Payer: Self-pay

## 2019-03-28 ENCOUNTER — Ambulatory Visit
Admission: RE | Admit: 2019-03-28 | Discharge: 2019-03-28 | Disposition: A | Discharge status: Home / Self Care | Payer: Medicare HMO | Source: Ambulatory Visit | Attending: Radiation Oncology | Admitting: Radiation Oncology

## 2019-03-28 DIAGNOSIS — Z510 Encounter for antineoplastic radiation therapy: Secondary | ICD-10-CM | POA: Diagnosis not present

## 2019-03-29 ENCOUNTER — Other Ambulatory Visit: Payer: Self-pay

## 2019-03-29 ENCOUNTER — Ambulatory Visit
Admission: RE | Admit: 2019-03-29 | Discharge: 2019-03-29 | Disposition: A | Discharge status: Home / Self Care | Payer: Medicare HMO | Source: Ambulatory Visit | Attending: Radiation Oncology | Admitting: Radiation Oncology

## 2019-03-29 ENCOUNTER — Encounter: Payer: Self-pay | Admitting: Nurse Practitioner

## 2019-03-29 ENCOUNTER — Inpatient Hospital Stay (HOSPITAL_BASED_OUTPATIENT_CLINIC_OR_DEPARTMENT_OTHER): Payer: Medicare HMO | Admitting: Nurse Practitioner

## 2019-03-29 ENCOUNTER — Inpatient Hospital Stay: Payer: Medicare HMO

## 2019-03-29 ENCOUNTER — Telehealth: Payer: Self-pay | Admitting: Nurse Practitioner

## 2019-03-29 VITALS — BP 134/83 | HR 64 | Temp 98.2°F | Resp 18 | Ht 68.0 in | Wt 181.1 lb

## 2019-03-29 DIAGNOSIS — R300 Dysuria: Secondary | ICD-10-CM

## 2019-03-29 DIAGNOSIS — N400 Enlarged prostate without lower urinary tract symptoms: Secondary | ICD-10-CM

## 2019-03-29 DIAGNOSIS — Z9049 Acquired absence of other specified parts of digestive tract: Secondary | ICD-10-CM

## 2019-03-29 DIAGNOSIS — Z905 Acquired absence of kidney: Secondary | ICD-10-CM

## 2019-03-29 DIAGNOSIS — C186 Malignant neoplasm of descending colon: Secondary | ICD-10-CM | POA: Diagnosis not present

## 2019-03-29 DIAGNOSIS — C20 Malignant neoplasm of rectum: Secondary | ICD-10-CM

## 2019-03-29 DIAGNOSIS — Z933 Colostomy status: Secondary | ICD-10-CM

## 2019-03-29 DIAGNOSIS — Z510 Encounter for antineoplastic radiation therapy: Secondary | ICD-10-CM | POA: Diagnosis not present

## 2019-03-29 DIAGNOSIS — Z87891 Personal history of nicotine dependence: Secondary | ICD-10-CM

## 2019-03-29 LAB — CBC WITH DIFFERENTIAL (CANCER CENTER ONLY)
Abs Immature Granulocytes: 0.01 10*3/uL (ref 0.00–0.07)
Basophils Absolute: 0 10*3/uL (ref 0.0–0.1)
Basophils Relative: 1 %
Eosinophils Absolute: 0.1 10*3/uL (ref 0.0–0.5)
Eosinophils Relative: 2 %
HCT: 36.7 % — ABNORMAL LOW (ref 39.0–52.0)
Hemoglobin: 12 g/dL — ABNORMAL LOW (ref 13.0–17.0)
Immature Granulocytes: 0 %
Lymphocytes Relative: 10 %
Lymphs Abs: 0.5 10*3/uL — ABNORMAL LOW (ref 0.7–4.0)
MCH: 29.7 pg (ref 26.0–34.0)
MCHC: 32.7 g/dL (ref 30.0–36.0)
MCV: 90.8 fL (ref 80.0–100.0)
Monocytes Absolute: 0.8 10*3/uL (ref 0.1–1.0)
Monocytes Relative: 15 %
Neutro Abs: 3.9 10*3/uL (ref 1.7–7.7)
Neutrophils Relative %: 72 %
Platelet Count: 135 10*3/uL — ABNORMAL LOW (ref 150–400)
RBC: 4.04 MIL/uL — ABNORMAL LOW (ref 4.22–5.81)
RDW: 14 % (ref 11.5–15.5)
WBC Count: 5.4 10*3/uL (ref 4.0–10.5)
nRBC: 0 % (ref 0.0–0.2)

## 2019-03-29 LAB — CMP (CANCER CENTER ONLY)
ALT: 9 U/L (ref 0–44)
AST: 15 U/L (ref 15–41)
Albumin: 3.8 g/dL (ref 3.5–5.0)
Alkaline Phosphatase: 63 U/L (ref 38–126)
Anion gap: 9 (ref 5–15)
BUN: 22 mg/dL (ref 8–23)
CO2: 25 mmol/L (ref 22–32)
Calcium: 9.2 mg/dL (ref 8.9–10.3)
Chloride: 106 mmol/L (ref 98–111)
Creatinine: 1.27 mg/dL — ABNORMAL HIGH (ref 0.61–1.24)
GFR, Est AFR Am: >60 mL/min (ref >60)
GFR, Estimated: 54 mL/min — ABNORMAL LOW (ref >60)
Glucose, Bld: 92 mg/dL (ref 70–99)
Potassium: 4.4 mmol/L (ref 3.5–5.1)
Sodium: 140 mmol/L (ref 135–145)
Total Bilirubin: 0.5 mg/dL (ref 0.3–1.2)
Total Protein: 6.9 g/dL (ref 6.5–8.1)

## 2019-03-29 MED FILL — CAPECITABINE 500 MG TABS: 500 | 7 days supply | Qty: 36 | Fill #1

## 2019-03-29 NOTE — Progress Notes (Signed)
  [place] OFFICE PROGRESS NOTE   Diagnosis: Colon cancer  INTERVAL HISTORY:   Mr. Davisson returns as scheduled.  He continues Xeloda and radiation.  He denies nausea/vomiting.  No mouth sores.  He occasionally notes loose stool in the colostomy bag.  No hand or foot pain or redness.  He has pain with urination.  He reports this predates the start of Xeloda/radiation.  Objective:  Vital signs in last 24 hours:  Blood pressure 134/83, pulse 64, temperature 98.2 F (36.8 C), temperature source Oral, resp. rate 18, height '5\' 8"'$  (1.727 m), weight 181 lb 1.6 oz (82.1 kg), SpO2 97 %.    HEENT: Mild white coating over tongue.  No buccal thrush.  No ulcers. Resp: Respirations even and unlabored. GI: Left lower quadrant colostomy.  Soft stool in the collection bag. Vascular: No leg edema. Skin: Palms without erythema.  Soles with mild erythema.  No skin breakdown.   Lab Results:  Lab Results  Component Value Date   WBC 5.4 03/29/2019   HGB 12.0 (L) 03/29/2019   HCT 36.7 (L) 03/29/2019   MCV 90.8 03/29/2019   PLT 135 (L) 03/29/2019   NEUTROABS 3.9 03/29/2019    Imaging:  No results found.  Medications: I have reviewed the patient's current medications.  Assessment/Plan: 1. Colon cancer, descending colon, stage II (T3 N0), status post a partial left colectomy 03/28/2017 ? MSI-stable, no loss of mismatch repair protein expression ? CT abdomen/pelvis 03/26/2017 and CT chest 03/27/2017-no evidence metastatic disease, abdomen/pelvis CT-long segment of bowel wall thickening in the left colon, partial mechanical obstruction by the area of thickening in the left colon, solid enhancing left renal lesion ? Elevated preoperative CEA ? CT 11/15/2018- irregular rectal wall thickening/perirectal stranding, polypoid defect in the mid transverse colon proximal to the colostomy-indeterminate, nodularity of the prostate with a hyperdense component in the median lobe-stable ?  MRI pelvis 02/01/2019- T3BN1 rectal tumor at 4.5 cm from the internal anal sphincter, 11.5 cm in length ? Colonoscopy 02/22/2019- malignant completely obstructing tumor in the proximal rectum.  Polyps in the mid and distal rectum; polyps in the ascending, transverse/distal transverse colon.  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  Rectum biopsy shows high-grade dysplasia with focal features suspicious for adenocarcinoma. ? 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 BPH  4. History of tobacco use  5.History of an irregular mole at the right upper back  Disposition: Mr. Flax appears stable.  He continues radiation/Xeloda.  He is having dysuria.  This may be related to the radiation.  We will check a urinalysis.  We reviewed the CBC from today.  Counts adequate to continue with treatment as above.  Creatinine is stable.  He will return for lab and follow-up in approximately 2 weeks.  He will contact the office in the interim with any problems.  I contacted his daughter by telephone and reviewed the above.    [name] ANP/GNP-BC   03/29/2019  11:48 AM

## 2019-03-29 NOTE — Telephone Encounter (Signed)
Scheduled appt per 4/16 los. °

## 2019-03-30 ENCOUNTER — Other Ambulatory Visit: Payer: Self-pay

## 2019-03-30 ENCOUNTER — Ambulatory Visit
Admission: RE | Admit: 2019-03-30 | Discharge: 2019-03-30 | Disposition: A | Discharge status: Home / Self Care | Payer: Medicare HMO | Source: Ambulatory Visit | Attending: Radiation Oncology | Admitting: Radiation Oncology

## 2019-03-30 ENCOUNTER — Telehealth: Payer: Self-pay | Admitting: *Deleted

## 2019-03-30 DIAGNOSIS — Z510 Encounter for antineoplastic radiation therapy: Secondary | ICD-10-CM | POA: Diagnosis not present

## 2019-03-30 LAB — URINALYSIS, COMPLETE (UACMP) WITH MICROSCOPIC
Bacteria, UA: NONE SEEN
Bilirubin Urine: NEGATIVE
Glucose, UA: NEGATIVE mg/dL
Ketones, ur: NEGATIVE mg/dL
Nitrite: NEGATIVE
Protein, ur: NEGATIVE mg/dL
Specific Gravity, Urine: 1.018 (ref 1.005–1.030)
pH: 5 (ref 5.0–8.0)

## 2019-03-30 NOTE — Telephone Encounter (Signed)
Left a voicemail letting her know that we have called in a prescription for her father to [facility] [place] for Bactrim ds.  Left my call back number in the event she has any questions or concerns.  Will continue to follow as necessary.  Gloriajean Dell. Leonie Green, BSN

## 2019-03-31 LAB — URINE CULTURE: Culture: <10000 — AB

## 2019-04-02 ENCOUNTER — Ambulatory Visit
Admission: RE | Admit: 2019-04-02 | Discharge: 2019-04-02 | Disposition: A | Discharge status: Home / Self Care | Payer: Medicare HMO | Source: Ambulatory Visit | Attending: Radiation Oncology | Admitting: Radiation Oncology

## 2019-04-02 ENCOUNTER — Other Ambulatory Visit: Payer: Self-pay

## 2019-04-02 DIAGNOSIS — Z510 Encounter for antineoplastic radiation therapy: Secondary | ICD-10-CM | POA: Diagnosis not present

## 2019-04-03 ENCOUNTER — Other Ambulatory Visit: Payer: Self-pay

## 2019-04-03 ENCOUNTER — Ambulatory Visit
Admission: RE | Admit: 2019-04-03 | Discharge: 2019-04-03 | Disposition: A | Discharge status: Home / Self Care | Payer: Medicare HMO | Source: Ambulatory Visit | Attending: Radiation Oncology | Admitting: Radiation Oncology

## 2019-04-03 DIAGNOSIS — Z510 Encounter for antineoplastic radiation therapy: Secondary | ICD-10-CM | POA: Diagnosis not present

## 2019-04-04 ENCOUNTER — Other Ambulatory Visit: Payer: Self-pay

## 2019-04-04 ENCOUNTER — Ambulatory Visit
Admission: RE | Admit: 2019-04-04 | Discharge: 2019-04-04 | Disposition: A | Discharge status: Home / Self Care | Payer: Medicare HMO | Source: Ambulatory Visit | Attending: Radiation Oncology | Admitting: Radiation Oncology

## 2019-04-04 DIAGNOSIS — Z510 Encounter for antineoplastic radiation therapy: Secondary | ICD-10-CM | POA: Diagnosis not present

## 2019-04-05 ENCOUNTER — Other Ambulatory Visit: Payer: Self-pay

## 2019-04-05 ENCOUNTER — Ambulatory Visit
Admission: RE | Admit: 2019-04-05 | Discharge: 2019-04-05 | Disposition: A | Discharge status: Home / Self Care | Payer: Medicare HMO | Source: Ambulatory Visit | Attending: Radiation Oncology | Admitting: Radiation Oncology

## 2019-04-05 DIAGNOSIS — Z510 Encounter for antineoplastic radiation therapy: Secondary | ICD-10-CM | POA: Diagnosis not present

## 2019-04-06 ENCOUNTER — Ambulatory Visit
Admission: RE | Admit: 2019-04-06 | Discharge: 2019-04-06 | Disposition: A | Discharge status: Home / Self Care | Payer: Medicare HMO | Source: Ambulatory Visit | Attending: Radiation Oncology | Admitting: Radiation Oncology

## 2019-04-06 ENCOUNTER — Other Ambulatory Visit: Payer: Self-pay

## 2019-04-06 DIAGNOSIS — Z510 Encounter for antineoplastic radiation therapy: Secondary | ICD-10-CM | POA: Diagnosis not present

## 2019-04-09 ENCOUNTER — Other Ambulatory Visit: Payer: Self-pay

## 2019-04-09 ENCOUNTER — Ambulatory Visit
Admission: RE | Admit: 2019-04-09 | Discharge: 2019-04-09 | Disposition: A | Discharge status: Home / Self Care | Payer: Medicare HMO | Source: Ambulatory Visit | Attending: Radiation Oncology | Admitting: Radiation Oncology

## 2019-04-09 ENCOUNTER — Telehealth: Payer: Self-pay | Admitting: *Deleted

## 2019-04-09 DIAGNOSIS — Z510 Encounter for antineoplastic radiation therapy: Secondary | ICD-10-CM | POA: Diagnosis not present

## 2019-04-09 NOTE — Telephone Encounter (Signed)
Spoke with Dr. Benay Spice regarding earlier message from daughter. Per Dr. Benay Spice: needs to see Dr. Lisbeth Renshaw for this issue. Left VM and sent secure chat to Dr. Ida Rogue nurse and notified staff at [facility] to let Dr. Ida Rogue nurse know he needs to be seen today.

## 2019-04-09 NOTE — Telephone Encounter (Signed)
"  Shawn Chambers daughter Shawn Chambers [phone]).  We're on our way for 9:30 radiation treatment.  He need to see Dr. Benay Spice and I do not want to wait.  justtold me he has pain around bladder area.  Couldn't sleep all nigh he hurt so bad.  Don't think he has fever but they'll check temp when he gets there.  Just finished antibiotic.  He says he's urinating no more than normal and no urgency.  Also says a nurse told him Friday a pain medicine would be called in that hasn't."

## 2019-04-10 ENCOUNTER — Telehealth: Payer: Self-pay | Admitting: *Deleted

## 2019-04-10 ENCOUNTER — Other Ambulatory Visit: Payer: Self-pay

## 2019-04-10 ENCOUNTER — Ambulatory Visit
Admission: RE | Admit: 2019-04-10 | Discharge: 2019-04-10 | Disposition: A | Discharge status: Home / Self Care | Payer: Medicare HMO | Source: Ambulatory Visit | Attending: Radiation Oncology | Admitting: Radiation Oncology

## 2019-04-10 ENCOUNTER — Other Ambulatory Visit: Payer: Self-pay | Admitting: Radiation Oncology

## 2019-04-10 DIAGNOSIS — Z510 Encounter for antineoplastic radiation therapy: Secondary | ICD-10-CM | POA: Diagnosis not present

## 2019-04-10 MED ORDER — PHENAZOPYRIDINE HCL 200 MG PO TABS: 200.0000 mg | ORAL_TABLET | Freq: Three times a day (TID) | ORAL | 0 refills | Status: DC | PRN

## 2019-04-10 NOTE — Telephone Encounter (Signed)
Spoke with [name] the patient daughter regarding a message I received from Dr. Gearldine Shown office.  I informed her that a [facility] was [facility] [place] [facility] Pyridium.  I let her know that this medication will make his urine change colors, she verbalized understanding.  Will continue to follow as necessary.  Gloriajean Dell. Leonie Green, BSN

## 2019-04-11 ENCOUNTER — Other Ambulatory Visit: Payer: Self-pay

## 2019-04-11 ENCOUNTER — Inpatient Hospital Stay: Payer: Medicare HMO

## 2019-04-11 ENCOUNTER — Ambulatory Visit
Admission: RE | Admit: 2019-04-11 | Discharge: 2019-04-11 | Disposition: A | Discharge status: Home / Self Care | Payer: Medicare HMO | Source: Ambulatory Visit | Attending: Radiation Oncology | Admitting: Radiation Oncology

## 2019-04-11 ENCOUNTER — Telehealth: Payer: Self-pay | Admitting: Nurse Practitioner

## 2019-04-11 ENCOUNTER — Inpatient Hospital Stay (HOSPITAL_BASED_OUTPATIENT_CLINIC_OR_DEPARTMENT_OTHER): Payer: Medicare HMO | Admitting: Nurse Practitioner

## 2019-04-11 ENCOUNTER — Encounter: Payer: Self-pay | Admitting: Nurse Practitioner

## 2019-04-11 VITALS — BP 116/69 | HR 68 | Temp 97.9°F | Resp 18 | Ht 68.0 in | Wt 182.4 lb

## 2019-04-11 DIAGNOSIS — Z510 Encounter for antineoplastic radiation therapy: Secondary | ICD-10-CM | POA: Diagnosis not present

## 2019-04-11 DIAGNOSIS — C20 Malignant neoplasm of rectum: Secondary | ICD-10-CM

## 2019-04-11 DIAGNOSIS — N400 Enlarged prostate without lower urinary tract symptoms: Secondary | ICD-10-CM | POA: Diagnosis not present

## 2019-04-11 DIAGNOSIS — Z9049 Acquired absence of other specified parts of digestive tract: Secondary | ICD-10-CM

## 2019-04-11 DIAGNOSIS — C186 Malignant neoplasm of descending colon: Secondary | ICD-10-CM | POA: Diagnosis not present

## 2019-04-11 DIAGNOSIS — Z87891 Personal history of nicotine dependence: Secondary | ICD-10-CM

## 2019-04-11 DIAGNOSIS — Z79899 Other long term (current) drug therapy: Secondary | ICD-10-CM

## 2019-04-11 DIAGNOSIS — Z933 Colostomy status: Secondary | ICD-10-CM

## 2019-04-11 LAB — CBC WITH DIFFERENTIAL (CANCER CENTER ONLY)
Abs Immature Granulocytes: 0.01 10*3/uL (ref 0.00–0.07)
Basophils Absolute: 0 10*3/uL (ref 0.0–0.1)
Basophils Relative: 1 %
Eosinophils Absolute: 0.1 10*3/uL (ref 0.0–0.5)
Eosinophils Relative: 3 %
HCT: 33.5 % — ABNORMAL LOW (ref 39.0–52.0)
Hemoglobin: 11.2 g/dL — ABNORMAL LOW (ref 13.0–17.0)
Immature Granulocytes: 0 %
Lymphocytes Relative: 5 %
Lymphs Abs: 0.2 10*3/uL — ABNORMAL LOW (ref 0.7–4.0)
MCH: 30.7 pg (ref 26.0–34.0)
MCHC: 33.4 g/dL (ref 30.0–36.0)
MCV: 91.8 fL (ref 80.0–100.0)
Monocytes Absolute: 0.8 10*3/uL (ref 0.1–1.0)
Monocytes Relative: 18 %
Neutro Abs: 3.2 10*3/uL (ref 1.7–7.7)
Neutrophils Relative %: 73 %
Platelet Count: 147 10*3/uL — ABNORMAL LOW (ref 150–400)
RBC: 3.65 MIL/uL — ABNORMAL LOW (ref 4.22–5.81)
RDW: 15.6 % — ABNORMAL HIGH (ref 11.5–15.5)
WBC Count: 4.4 10*3/uL (ref 4.0–10.5)
nRBC: 0 % (ref 0.0–0.2)

## 2019-04-11 LAB — CMP (CANCER CENTER ONLY)
ALT: 7 U/L (ref 0–44)
AST: 14 U/L — ABNORMAL LOW (ref 15–41)
Albumin: 3.6 g/dL (ref 3.5–5.0)
Alkaline Phosphatase: 65 U/L (ref 38–126)
Anion gap: 7 (ref 5–15)
BUN: 18 mg/dL (ref 8–23)
CO2: 25 mmol/L (ref 22–32)
Calcium: 9 mg/dL (ref 8.9–10.3)
Chloride: 108 mmol/L (ref 98–111)
Creatinine: 1.31 mg/dL — ABNORMAL HIGH (ref 0.61–1.24)
GFR, Est AFR Am: >60 mL/min (ref >60)
GFR, Estimated: 52 mL/min — ABNORMAL LOW (ref >60)
Glucose, Bld: 93 mg/dL (ref 70–99)
Potassium: 4.2 mmol/L (ref 3.5–5.1)
Sodium: 140 mmol/L (ref 135–145)
Total Bilirubin: 0.4 mg/dL (ref 0.3–1.2)
Total Protein: 6.7 g/dL (ref 6.5–8.1)

## 2019-04-11 NOTE — Progress Notes (Addendum)
  [place] OFFICE PROGRESS NOTE   Diagnosis: Colon cancer, rectal cancer  INTERVAL HISTORY:   [name] returns as scheduled.  He continues radiation/Xeloda.  He is scheduled to complete the course of radiation 04/12/2019.  He denies nausea/vomiting. No mouth sores. No diarrhea. Notes palms are dry. No pain. He is applying lotion.  He continues to have intermittent dysuria.  He has not started Pyridium as prescribed by radiation.  Objective:  Vital signs in last 24 hours:  Blood pressure 116/69, pulse 68, temperature 97.9 F (36.6 C), temperature source Oral, resp. rate 18, height _0  (1.727 m), weight 182 lb 6.4 oz (82.7 kg), SpO2 96 %.    HEENT: No thrush or ulcers. Scabbed, healing lesion upper lip. Vascular: No leg edema. Skin: Palms with mild erythema.  No skin breakdown.  Erythema with superficial skin ulceration mid gluteal fold.   Lab Results:  Lab Results  Component Value Date   WBC 4.4 04/11/2019   HGB 11.2 (L) 04/11/2019   HCT 33.5 (L) 04/11/2019   MCV 91.8 04/11/2019   PLT 147 (L) 04/11/2019   NEUTROABS 3.2 04/11/2019    Imaging:  No results found.  Medications: I have reviewed the patient's current medications.  Assessment/Plan: 1. Colon cancer, descending colon, stage II (T3 N0), status post a partial left colectomy 03/28/2017 ? MSI-stable, no loss of mismatch repair protein expression ? CT abdomen/pelvis 03/26/2017 and CT chest 03/27/2017-no evidence metastatic disease, abdomen/pelvis CT-long segment of bowel wall thickening in the left colon, partial mechanical obstruction by the area of thickening in the left colon, solid enhancing left renal lesion ? Elevated preoperative CEA ? CT 11/15/2018-irregular rectal wall thickening/perirectal stranding, polypoid defect in the mid transverse colon proximal to the colostomy-indeterminate, nodularity of the prostate with a hyperdense component in the median lobe-stable ? MRI pelvis  02/01/2019-T3BN1 rectal tumor at 4.5 cm from the internal anal sphincter, 11.5 cm in length ? Colonoscopy 02/22/2019- malignant completely obstructing tumor in the proximal rectum. Polyps in the mid and distal rectum; polyps in the ascending, transverse/distal transverse colon. 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 Rectum biopsy shows high-grade dysplasia with focal features suspicious for adenocarcinoma. ? 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 BPH  4. History of tobacco use  5.History of an irregular mole at the right upper back   6.   Clinical stage III rectal cancer ? Colonoscopy 02/22/2019- malignant completely obstructing tumor in the proximal rectum. Polyps in the mid and distal rectum; polyps in the ascending, transverse/distal transverse colon. 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 Rectum biopsy shows high-grade dysplasia with focal features suspicious for adenocarcinoma. ? Radiation/Xeloda 03/06/2019-04/12/2019  Disposition: Mr. Glidden appears stable.  He is scheduled to complete radiation/Xeloda tomorrow.  We made a referral back to Dr. Dema Severin to discuss surgery.  We reviewed the labs from today.  Counts adequate to continue treatment as above.  Creatinine is stable.  He will return for lab and follow-up in approximately 1 month.  I contacted his daughter and reviewed the above by phone.  Patient seen with Dr. Benay Spice.    [name] ANP/GNP-BC   04/11/2019  10:39 AM  This was a shared visit with [name].  Mr. Boody will complete the course of Xeloda and radiation tomorrow.  He will see Dr. Dema Severin to discuss surgical options.  [name], MD

## 2019-04-11 NOTE — Telephone Encounter (Signed)
Scheduled appt per 4/29 los. ° °A calendar will be mailed out. °

## 2019-04-12 ENCOUNTER — Other Ambulatory Visit: Payer: Self-pay

## 2019-04-12 ENCOUNTER — Ambulatory Visit
Admission: RE | Admit: 2019-04-12 | Discharge: 2019-04-12 | Disposition: A | Discharge status: Home / Self Care | Payer: Medicare HMO | Source: Ambulatory Visit | Attending: Radiation Oncology | Admitting: Radiation Oncology

## 2019-04-12 ENCOUNTER — Telehealth: Payer: Self-pay

## 2019-04-12 ENCOUNTER — Encounter: Payer: Self-pay | Admitting: Radiation Oncology

## 2019-04-12 DIAGNOSIS — Z510 Encounter for antineoplastic radiation therapy: Secondary | ICD-10-CM | POA: Diagnosis not present

## 2019-04-12 NOTE — Telephone Encounter (Signed)
-----   Message from Irving Copas., MD sent at 04/12/2019 12:29 PM EDT ----- Regarding: Schedule Colonoscopy Dear All, I would be happy to attempt endoscopic removal of the polyp to try and minimize rectal extent of surgical procedure. Shawn Chambers, let's try and get him in the hospital in the next 3-4 weeks. I would like to try and do this on a non-hospital week if possible. [facility] or [facility] is fine. Thanks. GM ----- Message ----- From: Jerene Bears, MD Sent: 04/12/2019  11:30 AM EDT To: Ileana Roup, MD, Ladell Pier, MD, #  Gerald Stabs, Given the need for a different surgical approach, we may be able to remove the most distal rectal polyps endoscopically. This would need to be done in the hospital setting, and given his dedicated hospital time and the size of these polyps I will ask Gabe Mansouraty to review and see if he can perform polypectomy on the more distal rectal polyps. Thanks Alma Downs,  This is a patient with a large rectal cancer being treated with neoadjuvant therapy with plans for resection with Dr. Dema Severin.  He had several polyps below the proximal rectal cancer which Dr. Dema Severin would like [place] to remove endoscopically if possible.  This will potentially allow for a more proximal rectal resection and ability to leave the distal rectum intact.  Thanks for reviewing, let me know your thoughts and if you would have time to schedule this during one of your hospital blocks.  Annye English and [name] can help with timing too.  Thanks, Ulice Dash ----- Message ----- From: Ileana Roup, MD Sent: 03/28/2019   8:57 AM EDT To: Jerene Bears, MD, Ladell Pier, MD  Shawn Chambers -   He is in the middle of chemoXRT at this time; once he completes (looks like end of April), would you be able to clear the distal rectum? Assuming he proceeds with surgery, we would plan to remove the rectum with cancer and not the rectum distal to it unless he had additional cancer etc lower down. If they  weren't removable we could try a transanal procedure to clear the remnant rectum prior to proctectomy. Let me know what you think  Gerald Stabs ----- Message ----- From: Jerene Bears, MD Sent: 02/22/2019   1:59 PM EDT To: Ileana Roup, MD, Ladell Pier, MD  Jason Nest, I got Shawn Chambers in for a colonoscopy via stoma and per rectum today. This gentleman was lost to follow-up after having a descending colon cancer removed with a transverse colostomy in 2018 He has a large circumferential rectal cancer which I biopsied --this was seen recently by MRI.  He has at least 3 additional polyps distal to this in the rectum which are large.  My understanding is that he will have chemotherapy and radiation followed by proctectomy. Colonoscopy per stoma revealed 13 polyps, one large but I removed them.  2 additional polyps were not able to be removed today due to the length of the procedure and colonic spasm. I will plan repeat colonoscopy per stoma in 6 months I shared this with the patient and his daughter, Shawn Chambers. Thanks

## 2019-04-12 NOTE — Progress Notes (Signed)
  Radiation Oncology         [phone] ________________________________  Name: Shawn Chambers MRN: [id number]  Date: 03/01/2019  DOB: 1940-12-28   SIMULATION AND TREATMENT PLANNING NOTE  DIAGNOSIS:     ICD-10-CM   1. Cancer of descending colon ([place]) C18.6      The patient presented for simulation for the patient's upcoming course of radiation for the diagnosis of rectal cancer. The patient was placed in a supine position. A customized vac-lock bag was constructed to aid in patient immobilization on. This complex treatment device will be used on a daily basis during the treatment. In this fashion a CT scan was obtained through the pelvic region and the isocenter was placed near midline within the pelvis. Surface markings were placed.  The patient's imaging was loaded into the radiation treatment planning system. The patient will initially be planned to receive a course of radiation to a dose of 45 Gy. This will be accomplished in 25 fractions at 1.8 gray per fraction. This initial treatment will correspond to a 3-D conformal technique. The target has been contoured in addition to the rectum, bladder and femoral heads. Dose volume histograms of each of these structures have been requested and these will be carefully reviewed as part of the 3-D conformal treatment planning process. To accomplish this initial treatment, 4 customized blocks have been designed for this purpose. Each of these 4 complex treatment devices will be used on a daily basis during the initial course of the treatment. It is anticipated that the patient will then receive a boost for an additional 5.4 Gy. The anticipated total dose therefore will be 50.4 Gy.    Special treatment procedure The patient will receive chemotherapy during the course of radiation treatment. The patient may experience increased or overlapping toxicity due to this combined-modality approach and the patient will be monitored for such problems. This may  include extra lab work as necessary. This therefore constitutes a special treatment procedure.    ________________________________  Jodelle Gross, MD, PhD

## 2019-04-12 NOTE — Telephone Encounter (Signed)
Pt to be contacted to set up case in 3-4 weeks

## 2019-04-12 NOTE — Progress Notes (Signed)
  Radiation Oncology         [phone] ________________________________  Name: Shawn Chambers MRN: [id number]  Date: 03/01/2019  DOB: 02-24-1941  Optical Surface Tracking Plan:  Since intensity modulated radiotherapy (IMRT) and 3D conformal radiation treatment methods are predicated on accurate and precise positioning for treatment, intrafraction motion monitoring is medically necessary to ensure accurate and safe treatment delivery.  The ability to quantify intrafraction motion without excessive ionizing radiation dose can only be performed with optical surface tracking. Accordingly, surface imaging offers the opportunity to obtain 3D measurements of patient position throughout IMRT and 3D treatments without excessive radiation exposure.  I am ordering optical surface tracking for this patient's upcoming course of radiotherapy. ________________________________  Kyung Rudd, MD 04/12/2019 11:26 AM    Reference:   Particia Jasper, et al. Surface imaging-based analysis of intrafraction motion for breast radiotherapy patients.Journal of [place], n. 6, nov. 2014. ISSN [id number].   Available at: <http://www.jacmp.org/index.php/jacmp/article/view/4957>.

## 2019-04-17 ENCOUNTER — Telehealth: Payer: Self-pay | Admitting: *Deleted

## 2019-04-17 NOTE — Telephone Encounter (Signed)
Daughter left VM that they have not heard from Dr. Orest Dikes office yet regarding an appointment. Per Dr. Benay Spice, referral just placed on 04/11/19 as routine. Don't usually do surgery until 6-8 week after treatment finishes. Called daughter and left VM with above information and suggested she give them another 1-2 weeks to contact them.

## 2019-04-18 ENCOUNTER — Telehealth: Payer: Self-pay | Admitting: *Deleted

## 2019-04-18 NOTE — Telephone Encounter (Addendum)
Was notified by Ebony Hail, [place] in radiation oncology that daughter wants to speak with Dr. Gearldine Shown nurse and that she left message on 04/17/19. This RN did return call and leave a voice mail yesterday. Called daughter again today and had to leave a voice mail again. Daughter called again and was provided the telephone # to call Dr. Tawanna Cooler office regarding the return appointment to schedule his surgery. Made her aware that surgery is not until 6-8 weeks after chemo finishes. She is asking how will he be evaluated to determine if chemo/RT worked. Informed her Dr. Dema Severin may order a preoperative scan

## 2019-05-03 ENCOUNTER — Telehealth: Payer: Self-pay | Admitting: Oncology

## 2019-05-03 NOTE — Telephone Encounter (Signed)
Left message re 5/28 f/u. Schedule mailed.

## 2019-05-08 NOTE — Progress Notes (Signed)
  Radiation Oncology         [phone] ________________________________  Name: Shawn Chambers MRN: [id number]  Date: 04/12/2019  DOB: Dec 11, 1941  End of Treatment Note  Diagnosis:   78 y.o. male with Stage T3N1M0 adenocarcinoma of the rectum  Indication for treatment:  Curative       Radiation treatment dates:   03/06/2019 - 04/12/2019  Site/dose:   The rectum was initially treated to 45 Gy in 25 fractions, followed by a 5.4 Gy boost delivered in 3 fractions, to yield a final total dose of 50.4 Gy.  Beams/energy:   3D / 15X, 6X Photon  Narrative: The patient tolerated radiation treatment relatively well.   He experienced some skin irritation with mild erythema present in the treated area. Moist desquamation was not present by the end of treatment. His stools remain soft and he also noticed some mucus from rectum. He took a course of antibiotics for UTI and continues to have some dysuria. A prescription for Pyridium was called in to his pharmacy.  Plan: The patient has completed radiation treatment. The patient will return to radiation oncology clinic for routine followup in one month. I advised them to call or return sooner if they have any questions or concerns related to their recovery or treatment.  ------------------------------------------------  Jodelle Gross, MD, PhD  This document serves as a record of services personally performed by Kyung Rudd, MD. It was created on his behalf by Rae Lips, a trained medical scribe. The creation of this record is based on the scribe's personal observations and the provider's statements to them. This document has been checked and approved by the attending provider.

## 2019-05-10 ENCOUNTER — Ambulatory Visit: Payer: Medicare HMO | Admitting: Nurse Practitioner

## 2019-05-10 ENCOUNTER — Telehealth: Payer: Self-pay | Admitting: Radiation Oncology

## 2019-05-10 NOTE — Telephone Encounter (Signed)
  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  Narrative:  The patient was contacted today for routine follow-up. During treatment she did very well with radiotherapy and did not have significant desquamation. His daughter answered when I called as the patient does not have a phone. She reports he's doing well and is back to his usual self. They went to see Dr. Dema Severin and were counseled against surgery due to postoperative risks. He is scheduled to see Dr. Benay Spice on Monday.   Impression/Plan: 1. 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 The patient has been doing well since completion of radiotherapy. We discussed that we would be happy to continue to follow him as needed, but he will also continue to follow up with Dr. Benay Spice in medical oncology.  2. Stage II, pT3N0M0 adenocarcinoma of the splenic flexure. The patient has been NED since surgery and continues to follow with Dr. Benay Spice as well.     Carola Rhine, PAC

## 2019-05-14 ENCOUNTER — Inpatient Hospital Stay (HOSPITAL_BASED_OUTPATIENT_CLINIC_OR_DEPARTMENT_OTHER): Payer: Medicare HMO | Admitting: Oncology

## 2019-05-14 ENCOUNTER — Inpatient Hospital Stay: Payer: Medicare HMO | Attending: Oncology

## 2019-05-14 ENCOUNTER — Other Ambulatory Visit: Payer: Self-pay

## 2019-05-14 ENCOUNTER — Telehealth: Payer: Self-pay | Admitting: Oncology

## 2019-05-14 VITALS — BP 141/69 | HR 77 | Temp 98.5°F | Resp 17 | Ht 68.0 in | Wt 183.9 lb

## 2019-05-14 DIAGNOSIS — Z87891 Personal history of nicotine dependence: Secondary | ICD-10-CM | POA: Diagnosis not present

## 2019-05-14 DIAGNOSIS — M79671 Pain in right foot: Secondary | ICD-10-CM | POA: Insufficient documentation

## 2019-05-14 DIAGNOSIS — M79604 Pain in right leg: Secondary | ICD-10-CM | POA: Diagnosis not present

## 2019-05-14 DIAGNOSIS — Z933 Colostomy status: Secondary | ICD-10-CM | POA: Insufficient documentation

## 2019-05-14 DIAGNOSIS — Z923 Personal history of irradiation: Secondary | ICD-10-CM | POA: Insufficient documentation

## 2019-05-14 DIAGNOSIS — C20 Malignant neoplasm of rectum: Secondary | ICD-10-CM | POA: Insufficient documentation

## 2019-05-14 DIAGNOSIS — C186 Malignant neoplasm of descending colon: Secondary | ICD-10-CM

## 2019-05-14 DIAGNOSIS — Z809 Family history of malignant neoplasm, unspecified: Secondary | ICD-10-CM | POA: Diagnosis not present

## 2019-05-14 DIAGNOSIS — G8929 Other chronic pain: Secondary | ICD-10-CM | POA: Diagnosis not present

## 2019-05-14 DIAGNOSIS — Z905 Acquired absence of kidney: Secondary | ICD-10-CM | POA: Diagnosis not present

## 2019-05-14 DIAGNOSIS — N400 Enlarged prostate without lower urinary tract symptoms: Secondary | ICD-10-CM | POA: Diagnosis not present

## 2019-05-14 LAB — CBC WITH DIFFERENTIAL (CANCER CENTER ONLY)
Abs Immature Granulocytes: 0.02 10*3/uL (ref 0.00–0.07)
Basophils Absolute: 0 10*3/uL (ref 0.0–0.1)
Basophils Relative: 1 %
Eosinophils Absolute: 0.1 10*3/uL (ref 0.0–0.5)
Eosinophils Relative: 2 %
HCT: 35.3 % — ABNORMAL LOW (ref 39.0–52.0)
Hemoglobin: 11.6 g/dL — ABNORMAL LOW (ref 13.0–17.0)
Immature Granulocytes: 0 %
Lymphocytes Relative: 10 %
Lymphs Abs: 0.6 10*3/uL — ABNORMAL LOW (ref 0.7–4.0)
MCH: 30.9 pg (ref 26.0–34.0)
MCHC: 32.9 g/dL (ref 30.0–36.0)
MCV: 94.1 fL (ref 80.0–100.0)
Monocytes Absolute: 0.8 10*3/uL (ref 0.1–1.0)
Monocytes Relative: 14 %
Neutro Abs: 4 10*3/uL (ref 1.7–7.7)
Neutrophils Relative %: 73 %
Platelet Count: 158 10*3/uL (ref 150–400)
RBC: 3.75 MIL/uL — ABNORMAL LOW (ref 4.22–5.81)
RDW: 15.4 % (ref 11.5–15.5)
WBC Count: 5.5 10*3/uL (ref 4.0–10.5)
nRBC: 0 % (ref 0.0–0.2)

## 2019-05-14 LAB — CMP (CANCER CENTER ONLY)
ALT: 12 U/L (ref 0–44)
AST: 17 U/L (ref 15–41)
Albumin: 3.9 g/dL (ref 3.5–5.0)
Alkaline Phosphatase: 58 U/L (ref 38–126)
Anion gap: 8 (ref 5–15)
BUN: 21 mg/dL (ref 8–23)
CO2: 27 mmol/L (ref 22–32)
Calcium: 9.7 mg/dL (ref 8.9–10.3)
Chloride: 107 mmol/L (ref 98–111)
Creatinine: 1.32 mg/dL — ABNORMAL HIGH (ref 0.61–1.24)
GFR, Est AFR Am: 59 mL/min — ABNORMAL LOW (ref >60)
GFR, Estimated: 51 mL/min — ABNORMAL LOW (ref >60)
Glucose, Bld: 105 mg/dL — ABNORMAL HIGH (ref 70–99)
Potassium: 4.8 mmol/L (ref 3.5–5.1)
Sodium: 142 mmol/L (ref 135–145)
Total Bilirubin: 0.4 mg/dL (ref 0.3–1.2)
Total Protein: 6.9 g/dL (ref 6.5–8.1)

## 2019-05-14 LAB — CEA (IN HOUSE-CHCC): CEA (CHCC-In House): 39.33 ng/mL — ABNORMAL HIGH (ref 0.00–5.00)

## 2019-05-14 NOTE — Telephone Encounter (Signed)
Scheduled appt per 6/1 los.  Spoke with patient daughter and she is aware of the patient appt date and time.

## 2019-05-14 NOTE — Progress Notes (Signed)
[place] OFFICE PROGRESS NOTE   Diagnosis: Colon cancer   INTERVAL HISTORY:   Shawn Chambers returns as scheduled.  His daughter is present for today's visit by telephone.  He reports resolution of the rectal urgency.  Skin which changes at the perineum have resolved.  The colostomy is functioning. His chief complaint is pain in the right leg that is worse with ambulation.  This pain was present prior to being diagnosed with rectal cancer.  Objective:  Vital signs in last 24 hours:  Blood pressure (!) 141/69, pulse 77, temperature 98.5 F (36.9 C), temperature source Oral, resp. rate 17, height _0  (1.727 m), weight 183 lb 14.4 oz (83.4 kg), SpO2 100 %.   GI: No hepatomegaly,, left abdomen colostomy with brown stool and prolapse of the mucosa Vascular: No leg edema or erythema, no palpable cord Musculoskeletal: Pain with motion at the right hip and standing, the pain is in the lower right leg Skin: Perineum without skin breakdown  Lab Results:  Lab Results  Component Value Date   WBC 5.5 05/14/2019   HGB 11.6 (L) 05/14/2019   HCT 35.3 (L) 05/14/2019   MCV 94.1 05/14/2019   PLT 158 05/14/2019   NEUTROABS 4.0 05/14/2019    CMP  Lab Results  Component Value Date   NA 142 05/14/2019   K 4.8 05/14/2019   CL 107 05/14/2019   CO2 27 05/14/2019   GLUCOSE 105 (H) 05/14/2019   BUN 21 05/14/2019   CREATININE 1.32 (H) 05/14/2019   CALCIUM 9.7 05/14/2019   PROT 6.9 05/14/2019   ALBUMIN 3.9 05/14/2019   AST 17 05/14/2019   ALT 12 05/14/2019   ALKPHOS 58 05/14/2019   BILITOT 0.4 05/14/2019   GFRNONAA 51 (L) 05/14/2019   GFRAA 59 (L) 05/14/2019    Lab Results  Component Value Date   CEA1 108.74 (H) 03/05/2019    Medications: I have reviewed the patient's current medications.   Assessment/Plan:  1. Colon cancer, descending colon, stage II (T3 N0), status post a partial left colectomy 03/28/2017 ? MSI-stable, no loss of mismatch repair protein  expression ? CT abdomen/pelvis 03/26/2017 and CT chest 03/27/2017-no evidence metastatic disease, abdomen/pelvis CT-long segment of bowel wall thickening in the left colon, partial mechanical obstruction by the area of thickening in the left colon, solid enhancing left renal lesion ? Elevated preoperative CEA ? CT 11/15/2018-irregular rectal wall thickening/perirectal stranding, polypoid defect in the mid transverse colon proximal to the colostomy-indeterminate, nodularity of the prostate with a hyperdense component in the median lobe-stable ? MRI pelvis 02/01/2019-T3BN1 rectal tumor at 4.5 cm from the internal anal sphincter, 11.5 cm in length ? Colonoscopy 02/22/2019- malignant completely obstructing tumor in the proximal rectum. Polyps in the mid and distal rectum; polyps in the ascending, transverse/distal transverse colon. 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 Rectum biopsy shows high-grade dysplasia with focal features suspicious for adenocarcinoma. ? 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 BPH  4. History of tobacco use  5.History of an irregular mole at the right upper back   6.   Clinical stage III rectal cancer ? Colonoscopy 02/22/2019- malignant completely obstructing tumor in the proximal rectum. Polyps in the mid and distal rectum; polyps in the ascending, transverse/distal transverse colon. 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 Rectum biopsy shows high-grade dysplasia with focal features suspicious for adenocarcinoma. ? Radiation/Xeloda 03/06/2019-04/12/2019   Disposition: Mr. Perkey completed the course of Xeloda and radiation.  The rectal symptoms have improved.  I discussed the prognosis and treatment options with Mr. Wehmeyer and his daughter.  I explained the chance of a complete remission with Xeloda and radiation is very small.  It is unlikely he would be  able to undergo curative therapy without surgery.  His daughter is under the impression Dr. Dema Severin does not recommend surgery.  I will clarify this with Dr. Dema Severin today.  He plans to seek medical attention for evaluation of the right leg pain.  This is a chronic problem.  I have a low clinical suspicion for pain related to rectal cancer or a venous thrombosis.  Mr. Syme will return for office visit on 05/25/2019.  We will contact him after I have discussed the case with Dr. Dema Severin.  25 minutes were spent with the patient today.  The majority of the time was used for counseling and coordination of care.  Betsy Coder, MD  05/14/2019  10:53 AM

## 2019-05-17 ENCOUNTER — Telehealth: Payer: Self-pay | Admitting: *Deleted

## 2019-05-17 NOTE — Telephone Encounter (Signed)
Called daughter to follow up on status of referral back to Dr. Dema Severin to consider surgery. She reports she spoke with Dr. Dema Severin by phone and he felt the surgery was too risky for him. Suggested to follow up with Dr. Benay Spice. Confirmed appointment with Dr. Benay Spice on 6/12 at 10 am.

## 2019-05-24 ENCOUNTER — Telehealth: Payer: Self-pay | Admitting: *Deleted

## 2019-05-24 NOTE — Telephone Encounter (Signed)
Patient has been scheduled for flexible sigmoidoscopy on 06/11/19 at 2:30 pm [facility] and for previsit on 06/05/19 at 430 pm. I have spoken to patient's daughter, [name] (DPR signed) to advise of this and she verbalizes understanding and is in agreement with plan.

## 2019-05-24 NOTE — Telephone Encounter (Signed)
-----   Message from Jerene Bears, MD sent at 05/24/2019  8:40 AM EDT ----- Regarding: FW: Follow up Carla Drape We should proceed with flex sigmoidoscopy as I recently recommended (date was previously recommended for after the week of 06/04/2019 This is to assess response to treatment, not necessarily for polypectomy Thanks JMP ----- Message ----- From: Irving Copas., MD Sent: 05/23/2019   3:42 PM EDT To: Ileana Roup, MD, Timothy Lasso, RN, # Subject: RE: Follow up                                  Thanks again. Ulice Dash, let me know if I can be of any assistance. GM ----- Message ----- From: Ladell Pier, MD Sent: 05/23/2019  12:21 PM EDT To: Ileana Roup, MD, Timothy Lasso, RN, # Subject: RE: Follow up                                  Agree, surveillance sigmoidoscopy only for prognostic information ----- Message ----- From: Ileana Roup, MD Sent: 05/23/2019  11:25 AM EDT To: Timothy Lasso, RN, Jerene Bears, MD, # Subject: RE: Follow up                                  He declined follow-up surgery for his rectal cancer so this is being treated "medically." Unless Dr. Benay Spice feels differently, it doesn't make a whole lot of sense to me to attempt removal of the other polyps if the cancer is being left in situ  Gerald Stabs ----- Message ----- From: Irving Copas., MD Sent: 05/22/2019   4:29 PM EDT To: Ileana Roup, MD, Timothy Lasso, RN, # Subject: Follow up                                      Dear All, Just wanted to check back in. As things, hopefully continue to get back to normal and as we get more procedures on board, wanted to check in with everyone. Gerald Stabs and [place], do you still need [place] to move forward with attempt at resection of the other polyps or is surgery off the table. Please let us know, so we can work on scheduling in the coming weeks. Thanks. GM

## 2019-05-25 ENCOUNTER — Inpatient Hospital Stay (HOSPITAL_BASED_OUTPATIENT_CLINIC_OR_DEPARTMENT_OTHER): Payer: Medicare HMO | Admitting: Oncology

## 2019-05-25 ENCOUNTER — Other Ambulatory Visit: Payer: Self-pay

## 2019-05-25 VITALS — BP 149/65 | HR 63 | Temp 98.5°F | Resp 18 | Ht 68.0 in | Wt 182.2 lb

## 2019-05-25 DIAGNOSIS — Z923 Personal history of irradiation: Secondary | ICD-10-CM

## 2019-05-25 DIAGNOSIS — M79604 Pain in right leg: Secondary | ICD-10-CM | POA: Diagnosis not present

## 2019-05-25 DIAGNOSIS — M79671 Pain in right foot: Secondary | ICD-10-CM

## 2019-05-25 DIAGNOSIS — Z933 Colostomy status: Secondary | ICD-10-CM

## 2019-05-25 DIAGNOSIS — Z809 Family history of malignant neoplasm, unspecified: Secondary | ICD-10-CM

## 2019-05-25 DIAGNOSIS — Z87891 Personal history of nicotine dependence: Secondary | ICD-10-CM

## 2019-05-25 DIAGNOSIS — C186 Malignant neoplasm of descending colon: Secondary | ICD-10-CM

## 2019-05-25 DIAGNOSIS — C20 Malignant neoplasm of rectum: Secondary | ICD-10-CM

## 2019-05-25 DIAGNOSIS — Z905 Acquired absence of kidney: Secondary | ICD-10-CM

## 2019-05-25 NOTE — Progress Notes (Signed)
[place] OFFICE PROGRESS NOTE   Diagnosis: Rectal cancer  INTERVAL HISTORY:   Shawn Chambers returns as scheduled.  He has intermittent discharge from the rectum.  No bleeding.  No rectal pain.  Urgency has improved. He discussed options with Dr. Dema Severin.  He has decided against surgery.  He complains of pain in the right lateral leg and foot.  He says the foot feels "cold ".  Objective:  Vital signs in last 24 hours:  Blood pressure (!) 149/65, pulse 63, temperature 98.5 F (36.9 C), temperature source Oral, resp. rate 18, height _0  (1.727 m), weight 182 lb 3.2 oz (82.6 kg), SpO2 97 %.     GI: No hepatomegaly, no mass, nontender, left lower quadrant colostomy with prolapsed bowel Vascular: No leg edema, palpable dorsal pedis pulse bilaterally, diminished capillary refill at the right compared to the left toes   Lab Results:  Lab Results  Component Value Date   WBC 5.5 05/14/2019   HGB 11.6 (L) 05/14/2019   HCT 35.3 (L) 05/14/2019   MCV 94.1 05/14/2019   PLT 158 05/14/2019   NEUTROABS 4.0 05/14/2019    CMP  Lab Results  Component Value Date   NA 142 05/14/2019   K 4.8 05/14/2019   CL 107 05/14/2019   CO2 27 05/14/2019   GLUCOSE 105 (H) 05/14/2019   BUN 21 05/14/2019   CREATININE 1.32 (H) 05/14/2019   CALCIUM 9.7 05/14/2019   PROT 6.9 05/14/2019   ALBUMIN 3.9 05/14/2019   AST 17 05/14/2019   ALT 12 05/14/2019   ALKPHOS 58 05/14/2019   BILITOT 0.4 05/14/2019   GFRNONAA 51 (L) 05/14/2019   GFRAA 59 (L) 05/14/2019    Lab Results  Component Value Date   CEA1 39.33 (H) 05/14/2019    Medications: I have reviewed the patient's current medications.   Assessment/Plan: 1. Colon cancer, descending colon, stage II (T3 N0), status post a partial left colectomy 03/28/2017 ? MSI-stable, no loss of mismatch repair protein expression ? CT abdomen/pelvis 03/26/2017 and CT chest 03/27/2017-no evidence metastatic disease, abdomen/pelvis CT-long  segment of bowel wall thickening in the left colon, partial mechanical obstruction by the area of thickening in the left colon, solid enhancing left renal lesion ? Elevated preoperative CEA ? CT 11/15/2018-irregular rectal wall thickening/perirectal stranding, polypoid defect in the mid transverse colon proximal to the colostomy-indeterminate, nodularity of the prostate with a hyperdense component in the median lobe-stable ? MRI pelvis 02/01/2019-T3BN1 rectal tumor at 4.5 cm from the internal anal sphincter, 11.5 cm in length ? Colonoscopy 02/22/2019- malignant completely obstructing tumor in the proximal rectum. Polyps in the mid and distal rectum; polyps in the ascending, transverse/distal transverse colon. 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 Rectum biopsy shows high-grade dysplasia with focal features suspicious for adenocarcinoma. ? 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 BPH  4. History of tobacco use  5.History of an irregular mole at the right upper back   6.   Clinical stage III rectal cancer ? Colonoscopy 02/22/2019- malignant completely obstructing tumor in the proximal rectum. Polyps in the mid and distal rectum; polyps in the ascending, transverse/distal transverse colon. 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 Rectum biopsy shows high-grade dysplasia with focal features suspicious for adenocarcinoma. ? Radiation/Xeloda 03/06/2019-04/12/2019    Disposition: Shawn Chambers appears unchanged.  He completed Xeloda and radiation for treatment of rectal cancer.  His rectal symptoms improved.  He has decided against rectal surgery.  He will be referred to Dr. Hilarie Fredrickson for a surveillance sigmoidoscopy to establish the current status of the tumor.  He will return for an office visit here in approximately 5 weeks.  He has pain in the right leg.  We sent a note to his daughter  explaining the pain may be related to peripheral vascular disease, lumbosacral spine disease or arthritis.  He says that he is scheduled for evaluation of the leg pain by physician in [place].  Betsy Coder, MD  05/25/2019  10:25 AM

## 2019-05-25 NOTE — Patient Instructions (Addendum)
[facility] AT [phone] to arrange for nursing visit to assist with colostomy care. His right leg pain may be related to vascular disease or his back--suggest seeing vascular specialist or orthopedic MD.

## 2019-05-28 ENCOUNTER — Telehealth: Payer: Self-pay | Admitting: Oncology

## 2019-05-28 NOTE — Telephone Encounter (Signed)
Scheduled appt per 6/12 los. Spoke with patient daughter and she is aware of the appt date and time.

## 2019-06-05 ENCOUNTER — Other Ambulatory Visit: Payer: Self-pay

## 2019-06-05 ENCOUNTER — Ambulatory Visit: Payer: Medicare HMO | Admitting: *Deleted

## 2019-06-05 VITALS — Ht 68.0 in | Wt 182.0 lb

## 2019-06-05 DIAGNOSIS — C20 Malignant neoplasm of rectum: Secondary | ICD-10-CM

## 2019-06-05 NOTE — Progress Notes (Signed)
No egg or soy allergy known to patient  No issues with past sedation with any surgeries  or procedures, no intubation problems  No diet pills per patient No home 02 use per patient  No blood thinners per patient  Pt denies issues with constipation  No A fib or A flutter  EMMI video sent to pt's e mail   PV completed with DAughter [name] as pt is very hard of hearing and could not hear to complete  Pt verified name, DOB, address and insurance during PV today. Pt mailed instruction packet to included paper to complete and mail back to [facility] with addressed and stamped envelope, Emmi video, copy of consent form to read and not return, and instructions.  PV completed over the phone. Pt encouraged to call with questions or issues   Pt is aware that care partner will wait in the car during parking lot; if they feel like they will be too hot to wait in the car; they may wait in the lobby.  We want them to wear a mask (we do not have any that we can provide them), practice social distancing, and we will check their temperatures when they get here.  I did remind patient that their care partner needs to stay in the parking lot the entire time. Pt will wear mask into building

## 2019-06-08 ENCOUNTER — Telehealth: Payer: Self-pay | Admitting: Internal Medicine

## 2019-06-08 NOTE — Telephone Encounter (Signed)
Spoke with patient's daughter regarding Covid-19 screening questions. Covid-19 Screening Questions:  Do you now or have you had a fever in the last 14 days? no  Do you have any respiratory symptoms of shortness of breath or cough now or in the last 14 days? no  Do you have any family members or close contacts with diagnosed or suspected Covid-19 in the past 14 days? no   Have you been tested for Covid-19 and found to be positive? no  Pt made aware of that care partner may wait in the car or come up to the lobby during the procedure but will need to provide their own mask.

## 2019-06-11 ENCOUNTER — Encounter: Payer: Self-pay | Admitting: Internal Medicine

## 2019-06-11 ENCOUNTER — Other Ambulatory Visit: Payer: Self-pay

## 2019-06-11 ENCOUNTER — Ambulatory Visit (AMBULATORY_SURGERY_CENTER): Payer: Medicare HMO | Admitting: Internal Medicine

## 2019-06-11 VITALS — BP 125/85 | HR 51 | Temp 98.5°F | Resp 16 | Ht 68.0 in | Wt 182.0 lb

## 2019-06-11 DIAGNOSIS — C20 Malignant neoplasm of rectum: Secondary | ICD-10-CM

## 2019-06-11 DIAGNOSIS — D129 Benign neoplasm of anus and anal canal: Secondary | ICD-10-CM | POA: Diagnosis not present

## 2019-06-11 DIAGNOSIS — D128 Benign neoplasm of rectum: Secondary | ICD-10-CM

## 2019-06-11 DIAGNOSIS — D3A8 Other benign neuroendocrine tumors: Secondary | ICD-10-CM | POA: Diagnosis not present

## 2019-06-11 MED ORDER — SODIUM CHLORIDE 0.9 % IV SOLN: 500.0000 mL | Freq: Once | INTRAVENOUS | Status: DC

## 2019-06-11 NOTE — Progress Notes (Signed)
Pt's states no medical or surgical changes since previsit or office visit.  Temp per BH, VS MO

## 2019-06-11 NOTE — Op Note (Signed)
[name]: Shawn Chambers Procedure Date: 06/11/2019 2:19 PM MRN: [id number] Endoscopist: Jerene Bears , MD Age: 78 Referring MD:  Date of Birth: Jun 21, 1941 Gender: Male Account #: [id number] Procedure:                Flexible Sigmoidoscopy Indications:              High risk colon cancer surveillance: Personal                            history of rectal cancer treated medically with                            Xeloda and radiation --for surveillance after                            medical therapy, last endoscopic exam March 2020;                            personal history of descending colon cancer status                            post resection with descending colostomy in place Medicines:                Monitored Anesthesia Care Procedure:                Pre-Anesthesia Assessment:                           - Prior to the procedure, a History and Physical                            was performed, and patient medications and                            allergies were reviewed. The patient's tolerance of                            previous anesthesia was also reviewed. The risks                            and benefits of the procedure and the sedation                            options and risks were discussed with the patient.                            All questions were answered, and informed consent                            was obtained. Prior Anticoagulants: The patient has                            taken no previous anticoagulant or antiplatelet  agents. ASA Grade Assessment: III - A patient with                            severe systemic disease. After reviewing the risks                            and benefits, the patient was deemed in                            satisfactory condition to undergo the procedure.                           After obtaining informed consent, the scope was                            passed  under direct vision. The Model PCF-H190DL                            [id number]) scope was introduced through the anus                            and advanced to the rectosigmoid junction. The                            flexible sigmoidoscopy was accomplished without                            difficulty. The patient tolerated the procedure                            well. The quality of the bowel preparation was good. Scope In: 2:28:59 PM Scope Out: 2:47:17 PM Total Procedure Duration: 0 hours 18 minutes 18 seconds  Findings:                 The digital rectal exam was normal with the                            exception of palpable polyp in the distal rectum.                           An infiltrative and ulcerated completely                            obstructing large mass was found in the                            recto-sigmoid colon/very proximal rectum. The mass                            was circumferential. No bleeding was present. This                            lesion is now less fungating but still large in  size.                           A 25 mm polyp was found in the proximal rectum,                            about 4 cm distal to the known cancer. The polyp                            was pedunculated and smaller than when seen in                            March 2020. The polyp was removed with a hot snare.                            Resection and retrieval were complete.                           A 12 mm polyp was found in the distal rectum. The                            polyp was sessile and smaller than when seen in                            March 2020. The polyp was removed with a hot snare.                            Resection and retrieval were complete.                           Retroflexion in the rectum was not performed. Complications:            No immediate complications. Estimated Blood Loss:     Estimated blood loss was  minimal. Impression:               - Malignant completely obstructing tumor in the                            recto-sigmoid colon.                           - One 25 mm polyp in the proximal rectum with a hot                            snare. Resected and retrieved.                           - One 12 mm polyp in the distal rectum, removed                            with a hot snare. Resected and retrieved. Recommendation:           - Patient has a contact number available for  emergencies. The signs and symptoms of potential                            delayed complications were discussed with the                            patient. Return to normal activities tomorrow.                            Written discharge instructions were provided to the                            patient.                           - Resume previous diet.                           - Continue present medications.                           - No NSAIDs for at least 2 weeks.                           - Await pathology results.                           -Return to see Dr. Benay Spice at scheduled follow-up. Jerene Bears, MD 06/11/2019 3:01:50 PM This report has been signed electronically.

## 2019-06-11 NOTE — Progress Notes (Signed)
Per Dr. Hilarie Fredrickson okay to send regular pathology.  Do not need to send [facility]

## 2019-06-11 NOTE — Progress Notes (Signed)
Called to room to assist during endoscopic procedure.  Patient ID and intended procedure confirmed with present staff. Received instructions for my participation in the procedure from the performing physician.  

## 2019-06-11 NOTE — Patient Instructions (Signed)
Discharge instructions given. Handout on polyps. No NSAIDS  FOR AT LEAST 2 WEEKS. RETURN TO SEE DR. SHERRILL AT SCHEDULED FOLLOW-UP RESUME PREVIOUS MEDICATIONS. YOU HAD AN ENDOSCOPIC PROCEDURE TODAY AT [facility]:   Refer to the procedure report that was given to you for any specific questions about what was found during the examination.  If the procedure report does not answer your questions, please call your gastroenterologist to clarify.  If you requested that your care partner not be given the details of your procedure findings, then the procedure report has been included in a sealed envelope for you to review at your convenience later.  YOU SHOULD EXPECT: Some feelings of bloating in the abdomen. Passage of more gas than usual.  Walking can help get rid of the air that was put into your GI tract during the procedure and reduce the bloating. If you had a lower endoscopy (such as a colonoscopy or flexible sigmoidoscopy) you may notice spotting of blood in your stool or on the toilet paper. If you underwent a bowel prep for your procedure, you may not have a normal bowel movement for a few days.  Please Note:  You might notice some irritation and congestion in your nose or some drainage.  This is from the oxygen used during your procedure.  There is no need for concern and it should clear up in a day or so.  SYMPTOMS TO REPORT IMMEDIATELY:   Following lower endoscopy (colonoscopy or flexible sigmoidoscopy):  Excessive amounts of blood in the stool  Significant tenderness or worsening of abdominal pains  Swelling of the abdomen that is new, acute  Fever of 100F or higher   For urgent or emergent issues, a gastroenterologist can be reached at any hour by calling [phone].   DIET:  We do recommend a small meal at first, but then you may proceed to your regular diet.  Drink plenty of fluids but you should avoid alcoholic beverages for 24 hours.  ACTIVITY:  You should  plan to take it easy for the rest of today and you should NOT DRIVE or use heavy machinery until tomorrow (because of the sedation medicines used during the test).    FOLLOW UP: Our staff will call the number listed on your records 48-72 hours following your procedure to check on you and address any questions or concerns that you may have regarding the information given to you following your procedure. If we do not reach you, we will leave a message.  We will attempt to reach you two times.  During this call, we will ask if you have developed any symptoms of COVID 19. If you develop any symptoms (ie: fever, flu-like symptoms, shortness of breath, cough etc.) before then, please call [phone].  If you test positive for Covid 19 in the 2 weeks post procedure, please call and report this information to [place].    If any biopsies were taken you will be contacted by phone or by letter within the next 1-3 weeks.  Please call us at [phone] if you have not heard about the biopsies in 3 weeks.    SIGNATURES/CONFIDENTIALITY: You and/or your care partner have signed paperwork which will be entered into your electronic medical record.  These signatures attest to the fact that that the information above on your After Visit Summary has been reviewed and is understood.  Full responsibility of the confidentiality of this discharge information lies with you and/or your care-partner.

## 2019-06-11 NOTE — Progress Notes (Signed)
To PACU, VSS. Report to [email or web address] 

## 2019-06-13 ENCOUNTER — Telehealth: Payer: Self-pay

## 2019-06-13 NOTE — Telephone Encounter (Signed)
  Follow up Call-  Call back number 06/11/2019 02/22/2019  Post procedure Call Back phone  # Call Daughter [name] [phone] (patient's sister)  Permission to leave phone message Yes Yes     Patient questions:  Do you have a fever, pain , or abdominal swelling? No. Pain Score  0 *  Have you tolerated food without any problems? Yes.    Have you been able to return to your normal activities? Yes.    Do you have any questions about your discharge instructions: Diet   No. Medications  No. Follow up visit  No.  Do you have questions or concerns about your Care? No.  Actions: * If pain score is 4 or above: No action needed, pain <4.   1. Have you developed a fever since your procedure? no  2.   Have you had an respiratory symptoms (SOB or cough) since your procedure? no  3.   Have you tested positive for COVID 19 since your procedure no  4.   Have you had any family members/close contacts diagnosed with the COVID 19 since your procedure?  no   If yes to any of these questions please route to Joylene John, RN and Alphonsa Gin, Therapist, sports.

## 2019-06-25 ENCOUNTER — Encounter: Payer: Self-pay | Admitting: Nurse Practitioner

## 2019-06-25 ENCOUNTER — Other Ambulatory Visit: Payer: Self-pay

## 2019-06-25 ENCOUNTER — Inpatient Hospital Stay: Payer: Medicare HMO | Attending: Oncology | Admitting: Nurse Practitioner

## 2019-06-25 VITALS — BP 140/79 | HR 62 | Temp 99.2°F | Resp 17 | Ht 68.0 in | Wt 184.9 lb

## 2019-06-25 DIAGNOSIS — C7A8 Other malignant neuroendocrine tumors: Secondary | ICD-10-CM

## 2019-06-25 DIAGNOSIS — Z933 Colostomy status: Secondary | ICD-10-CM

## 2019-06-25 DIAGNOSIS — Z923 Personal history of irradiation: Secondary | ICD-10-CM

## 2019-06-25 DIAGNOSIS — Z85038 Personal history of other malignant neoplasm of large intestine: Secondary | ICD-10-CM | POA: Diagnosis not present

## 2019-06-25 DIAGNOSIS — C20 Malignant neoplasm of rectum: Secondary | ICD-10-CM

## 2019-06-25 DIAGNOSIS — N400 Enlarged prostate without lower urinary tract symptoms: Secondary | ICD-10-CM | POA: Diagnosis not present

## 2019-06-25 DIAGNOSIS — Z87891 Personal history of nicotine dependence: Secondary | ICD-10-CM

## 2019-06-25 NOTE — Progress Notes (Signed)
[place] OFFICE PROGRESS NOTE   Diagnosis: Rectal cancer  INTERVAL HISTORY:   Shawn Chambers returns as scheduled.  He feels well.  He denies rectal bleeding and pain.  He intermittently notes a small amount of yellowish rectal discharge.  No nausea or vomiting.  Colostomy functioning normally.  Objective:  Vital signs in last 24 hours:  Blood pressure 140/79, pulse 62, temperature 99.2 F (37.3 C), temperature source Oral, resp. rate 17, height '5\' 8"'  (1.727 m), weight 184 lb 14.4 oz (83.9 kg), SpO2 96 %.   Limited exam due to COVID-19 pandemic. GI: Left lower quadrant colostomy with prolapsed bowel, semi-formed stool in the collection bag. Vascular: No leg edema. Skin: Palms without erythema.   Lab Results:  Lab Results  Component Value Date   WBC 5.5 05/14/2019   HGB 11.6 (L) 05/14/2019   HCT 35.3 (L) 05/14/2019   MCV 94.1 05/14/2019   PLT 158 05/14/2019   NEUTROABS 4.0 05/14/2019    Imaging:  No results found.  Medications: I have reviewed the patient's current medications.  Assessment/Plan: 1. Colon cancer, descending colon, stage II (T3 N0), status post a partial left colectomy 03/28/2017 ? MSI-stable, no loss of mismatch repair protein expression ? CT abdomen/pelvis 03/26/2017 and CT chest 03/27/2017-no evidence metastatic disease, abdomen/pelvis CT-long segment of bowel wall thickening in the left colon, partial mechanical obstruction by the area of thickening in the left colon, solid enhancing left renal lesion ? Elevated preoperative CEA ? CT 11/15/2018-irregular rectal wall thickening/perirectal stranding, polypoid defect in the mid transverse colon proximal to the colostomy-indeterminate, nodularity of the prostate with a hyperdense component in the median lobe-stable ? MRI pelvis 02/01/2019-T3BN1 rectal tumor at 4.5 cm from the internal anal sphincter, 11.5 cm in length ? Colonoscopy 02/22/2019-malignant completely obstructing tumor in the  proximal rectum. Polyps in the mid and distal rectum; polyps in the ascending, transverse/distal transverse colon. 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 Rectum biopsy shows high-grade dysplasia with focal features suspicious for adenocarcinoma. ? 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 BPH  4. History of tobacco use  5.History of an irregular mole at the right upper back   6.   Clinical stage III rectal cancer ? Colonoscopy 02/22/2019-malignant completely obstructing tumor in the proximal rectum. Polyps in the mid and distal rectum; polyps in the ascending, transverse/distal transverse colon. 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 Rectum biopsy shows high-grade dysplasia with focal features suspicious for adenocarcinoma. ? Radiation/Xeloda 03/06/2019-04/12/2019 ? Flexible sigmoidoscopy 06/11/2019-infiltrative and ulcerated completely obstructing large mass found in the rectosigmoid colon/very proximal rectum.  Mass circumferential.  No bleeding present.  Lesion now less fungating but still large in size.  25 mm polyp found in the proximal rectum.  12 mm polyp found in the distal rectum.  7.  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  Tumor appears to involve superficial submucosa. Tumor present at cauterized resection margin (polyp base).  No evidence of lymphovascular invasion.  Other polyp showing tubulovillous adenoma with focal high-grade dysplasia.  Disposition: [facility] Chambers appears stable.  He completed the course of radiation and Xeloda on 04/12/2019.  Rectal pain and bleeding have improved.  Follow-up sigmoidoscopy on 06/11/2019 showed a persistent rectal mass, completely obstructing, in the rectosigmoid colon/very proximal rectum.  The lesion was less fungating but still  large in size.  He was also found to have 2 rectal polyps.  Shawn Chambers discussed surgery versus chemotherapy with Shawn Chambers at today's visit.  Shawn Chambers would like to discuss the possibility of surgery with Shawn Chambers.  He indicates he is not interested in chemotherapy at this time.  Pathology on a rectal polyp from 06/11/2019 showed a well-differentiated neuroendocrine tumor.  We reviewed this with Shawn Chambers at today's visit.  We scheduled a return visit here in 4 to 6 weeks.  We are referring him back to Shawn Chambers for discussion regarding surgery.  We will contact his daughter to review the above as well.  Patient seen with Shawn Chambers.    Shawn Chambers Shawn Chambers   06/25/2019  12:13 PM  This was a shared visit with Shawn Chambers.  Shawn Chambers appears unchanged.  The sigmoidoscopy confirmed persistent tumor in the rectum.  There is also a tubular adenoma with a focus of a neuroendocrine tumor.  We discussed the sigmoidoscopy findings and treatment options with Shawn Chambers.  He appears to have limited understanding of the current situation.  I explained the role for further systemic therapy would be palliative.  He stated that he would like to consider surgery.  I discussed the case with his daughter by telephone.  She understands chemotherapy will not be curative.  She says Shawn Chambers would like to consider surgery and will agree to another surgical opinion.  We will try to schedule a second surgical opinion with Shawn Chambers or Shawn Chambers.  Shawn Chambers

## 2019-06-26 ENCOUNTER — Telehealth: Payer: Self-pay | Admitting: Oncology

## 2019-06-26 NOTE — Telephone Encounter (Signed)
Called and spoke with patients daughter. Confirmed date and time

## 2019-06-27 ENCOUNTER — Telehealth: Payer: Self-pay | Admitting: *Deleted

## 2019-06-27 NOTE — Telephone Encounter (Signed)
Daughter had called answering service yesterday requesting update on visit with patient. States he does not hear well. Wants to understand next steps in his treatment. Attempted to call-left VM that RN will call tomorrow.

## 2019-06-28 ENCOUNTER — Telehealth: Payer: Self-pay | Admitting: *Deleted

## 2019-06-28 NOTE — Telephone Encounter (Signed)
Spoke w/daughter that Dr. Benay Spice suggested chemo w/FOLFOX, but patient refused. Wants surgery, despite knowing he will have a permanent colostomy. Scheduled to see Dr. Marcello Moores on 07/05/19 for 2nd opinion. She will call afterwards with his decision.

## 2019-07-06 ENCOUNTER — Other Ambulatory Visit: Payer: Self-pay | Admitting: General Surgery

## 2019-07-06 DIAGNOSIS — C20 Malignant neoplasm of rectum: Secondary | ICD-10-CM

## 2019-07-26 ENCOUNTER — Other Ambulatory Visit: Payer: Self-pay

## 2019-07-26 ENCOUNTER — Inpatient Hospital Stay: Payer: Medicare HMO | Attending: Oncology | Admitting: Oncology

## 2019-07-26 VITALS — BP 157/55 | HR 77 | Temp 98.2°F | Resp 17 | Ht 68.0 in | Wt 188.4 lb

## 2019-07-26 DIAGNOSIS — Z85038 Personal history of other malignant neoplasm of large intestine: Secondary | ICD-10-CM | POA: Insufficient documentation

## 2019-07-26 DIAGNOSIS — Z87891 Personal history of nicotine dependence: Secondary | ICD-10-CM | POA: Insufficient documentation

## 2019-07-26 DIAGNOSIS — Z9221 Personal history of antineoplastic chemotherapy: Secondary | ICD-10-CM | POA: Insufficient documentation

## 2019-07-26 DIAGNOSIS — Z933 Colostomy status: Secondary | ICD-10-CM | POA: Insufficient documentation

## 2019-07-26 DIAGNOSIS — N400 Enlarged prostate without lower urinary tract symptoms: Secondary | ICD-10-CM | POA: Insufficient documentation

## 2019-07-26 DIAGNOSIS — Z23 Encounter for immunization: Secondary | ICD-10-CM | POA: Insufficient documentation

## 2019-07-26 DIAGNOSIS — Z905 Acquired absence of kidney: Secondary | ICD-10-CM | POA: Diagnosis not present

## 2019-07-26 DIAGNOSIS — C7A026 Malignant carcinoid tumor of the rectum: Secondary | ICD-10-CM | POA: Insufficient documentation

## 2019-07-26 DIAGNOSIS — C20 Malignant neoplasm of rectum: Secondary | ICD-10-CM | POA: Insufficient documentation

## 2019-07-26 NOTE — Progress Notes (Signed)
[place] OFFICE PROGRESS NOTE   Diagnosis: Rectal cancer  INTERVAL HISTORY:   Shawn Chambers returns as scheduled.  He feels well.  Good appetite.  No rectal pain or bleeding.  Occasional mucus discharge from the rectum.  He has some leakage around the wafer at the colostomy site.  He had transient discomfort in the suprapubic region. He saw Dr. Dema Severin and has been scheduled for a staging pelvic MRI prior to considering surgery.  Objective:  Vital signs in last 24 hours:  Blood pressure (!) 157/55, pulse 77, temperature 98.2 F (36.8 C), temperature source Oral, resp. rate 17, height '5\' 8"'  (1.727 m), weight 188 lb 6.4 oz (85.5 kg), SpO2 100 %.     GI: Left lower quadrant colostomy with prolapsed bowel, no hepatomegaly, no mass, nontender Vascular: No leg edema   Lab Results:  Lab Results  Component Value Date   WBC 5.5 05/14/2019   HGB 11.6 (L) 05/14/2019   HCT 35.3 (L) 05/14/2019   MCV 94.1 05/14/2019   PLT 158 05/14/2019   NEUTROABS 4.0 05/14/2019    CMP  Lab Results  Component Value Date   NA 142 05/14/2019   K 4.8 05/14/2019   CL 107 05/14/2019   CO2 27 05/14/2019   GLUCOSE 105 (H) 05/14/2019   BUN 21 05/14/2019   CREATININE 1.32 (H) 05/14/2019   CALCIUM 9.7 05/14/2019   PROT 6.9 05/14/2019   ALBUMIN 3.9 05/14/2019   AST 17 05/14/2019   ALT 12 05/14/2019   ALKPHOS 58 05/14/2019   BILITOT 0.4 05/14/2019   GFRNONAA 51 (L) 05/14/2019   GFRAA 59 (L) 05/14/2019    Lab Results  Component Value Date   CEA1 39.33 (H) 05/14/2019     Medications: I have reviewed the patient's current medications.   Assessment/Plan: 1. Colon cancer, descending colon, stage II (T3 N0), status post a partial left colectomy 03/28/2017 ? MSI-stable, no loss of mismatch repair protein expression ? CT abdomen/pelvis 03/26/2017 and CT chest 03/27/2017-no evidence metastatic disease, abdomen/pelvis CT-long segment of bowel wall thickening in the left colon,  partial mechanical obstruction by the area of thickening in the left colon, solid enhancing left renal lesion ? Elevated preoperative CEA ? CT 11/15/2018-irregular rectal wall thickening/perirectal stranding, polypoid defect in the mid transverse colon proximal to the colostomy-indeterminate, nodularity of the prostate with a hyperdense component in the median lobe-stable ? MRI pelvis 02/01/2019-T3BN1 rectal tumor at 4.5 cm from the internal anal sphincter, 11.5 cm in length ? Colonoscopy 02/22/2019-malignant completely obstructing tumor in the proximal rectum. Polyps in the mid and distal rectum; polyps in the ascending, transverse/distal transverse colon. 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 Rectum biopsy shows high-grade dysplasia with focal features suspicious for adenocarcinoma. ? 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 BPH  4. History of tobacco use  5.History of an irregular mole at the right upper back   6.   Clinical stage III rectal cancer ? Colonoscopy 02/22/2019-malignant completely obstructing tumor in the proximal rectum. Polyps in the mid and distal rectum; polyps in the ascending, transverse/distal transverse colon. 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 Rectum biopsy shows high-grade dysplasia with focal features suspicious for adenocarcinoma. ? Radiation/Xeloda 03/06/2019-04/12/2019 ? Flexible sigmoidoscopy 06/11/2019-infiltrative and ulcerated completely obstructing large mass found in the rectosigmoid colon/very proximal rectum.  Mass circumferential.  No bleeding present.  Lesion now less fungating but still large in size.  25 mm polyp found in the proximal  rectum.  12 mm polyp found in the distal rectum.  7.  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   Tumor appears to involve superficial submucosa. Tumor present at cauterized resection margin (polyp base).  No evidence of lymphovascular invasion.  Other polyp showing tubulovillous adenoma with focal high-grade dysplasia.    Disposition: Shawn Chambers appears stable.  He is scheduled for a pelvic MRI on 08/11/2019.  He will meet with Dr. Dema Severin after the MRI.  He understands the only potentially curative treatment is surgery.  He will return for office visit here during the week of 09/10/2019.  I am available to see him sooner as needed.  Betsy Coder, MD  07/26/2019  10:46 AM

## 2019-07-27 ENCOUNTER — Telehealth: Payer: Self-pay | Admitting: Oncology

## 2019-07-27 NOTE — Telephone Encounter (Signed)
Called and spoke with patients daughter. Confirmed 9/28 appt

## 2019-08-11 ENCOUNTER — Other Ambulatory Visit: Payer: Medicare HMO

## 2019-08-17 ENCOUNTER — Ambulatory Visit
Admission: RE | Admit: 2019-08-17 | Discharge: 2019-08-17 | Disposition: A | Discharge status: Home / Self Care | Payer: Medicare HMO | Source: Ambulatory Visit | Attending: General Surgery | Admitting: General Surgery

## 2019-08-17 ENCOUNTER — Other Ambulatory Visit: Payer: Self-pay

## 2019-08-17 DIAGNOSIS — C20 Malignant neoplasm of rectum: Secondary | ICD-10-CM

## 2019-08-17 MED ORDER — GADOBENATE DIMEGLUMINE 529 MG/ML IV SOLN
8.0000 mL | Freq: Once | INTRAVENOUS | Status: AC | PRN
  Administered 2019-08-17: 8 mL via INTRAVENOUS

## 2019-09-07 ENCOUNTER — Telehealth: Payer: Self-pay | Admitting: Nurse Practitioner

## 2019-09-07 NOTE — Telephone Encounter (Signed)
Returned patient's phone call regarding rescheduling an appointment, left a voicemail. 

## 2019-09-10 ENCOUNTER — Other Ambulatory Visit: Payer: Self-pay | Admitting: General Surgery

## 2019-09-10 ENCOUNTER — Ambulatory Visit: Payer: Medicare HMO | Admitting: Nurse Practitioner

## 2019-09-13 ENCOUNTER — Inpatient Hospital Stay: Payer: Medicare HMO | Attending: Oncology | Admitting: Nurse Practitioner

## 2019-09-13 ENCOUNTER — Encounter: Payer: Self-pay | Admitting: Nurse Practitioner

## 2019-09-13 ENCOUNTER — Other Ambulatory Visit: Payer: Self-pay

## 2019-09-13 VITALS — BP 163/89 | HR 68 | Temp 98.3°F | Resp 17 | Ht 68.0 in | Wt 184.5 lb

## 2019-09-13 DIAGNOSIS — Z923 Personal history of irradiation: Secondary | ICD-10-CM | POA: Diagnosis not present

## 2019-09-13 DIAGNOSIS — Z85038 Personal history of other malignant neoplasm of large intestine: Secondary | ICD-10-CM | POA: Insufficient documentation

## 2019-09-13 DIAGNOSIS — Z905 Acquired absence of kidney: Secondary | ICD-10-CM | POA: Insufficient documentation

## 2019-09-13 DIAGNOSIS — Z87891 Personal history of nicotine dependence: Secondary | ICD-10-CM | POA: Insufficient documentation

## 2019-09-13 DIAGNOSIS — Z933 Colostomy status: Secondary | ICD-10-CM | POA: Insufficient documentation

## 2019-09-13 DIAGNOSIS — C7A8 Other malignant neuroendocrine tumors: Secondary | ICD-10-CM | POA: Diagnosis not present

## 2019-09-13 DIAGNOSIS — C20 Malignant neoplasm of rectum: Secondary | ICD-10-CM | POA: Diagnosis not present

## 2019-09-13 DIAGNOSIS — N400 Enlarged prostate without lower urinary tract symptoms: Secondary | ICD-10-CM | POA: Diagnosis not present

## 2019-09-13 NOTE — Progress Notes (Signed)
[facility] OFFICE PROGRESS NOTE   Diagnosis: Rectal cancer  INTERVAL HISTORY:   Shawn Chambers returns as scheduled.  He reports feeling well.  He denies rectal pain or bleeding.  He has a good appetite.  He occasionally notes itching around the colostomy when the bag leaks.  Objective:  Vital signs in last 24 hours:  Blood pressure (!) 163/89, pulse 68, temperature 98.3 F (36.8 C), temperature source Temporal, resp. rate 17, height '5\' 8"'  (1.727 m), weight 184 lb 8 oz (83.7 kg), SpO2 98 %.    Lymphatics: No palpable cervical or supraclavicular lymph nodes. GI: Abdomen soft and nontender.  No hepatomegaly.  Left lower quadrant colostomy with prolapsed bowel. Vascular: No leg edema.   Lab Results:  Lab Results  Component Value Date   WBC 5.5 05/14/2019   HGB 11.6 (L) 05/14/2019   HCT 35.3 (L) 05/14/2019   MCV 94.1 05/14/2019   PLT 158 05/14/2019   NEUTROABS 4.0 05/14/2019    Imaging:  No results found.  Medications: I have reviewed the patient's current medications.  Assessment/Plan: 1. Colon cancer, descending colon, stage II (T3 N0), status post a partial left colectomy 03/28/2017 ? MSI-stable, no loss of mismatch repair protein expression ? CT abdomen/pelvis 03/26/2017 and CT chest 03/27/2017-no evidence metastatic disease, abdomen/pelvis CT-long segment of bowel wall thickening in the left colon, partial mechanical obstruction by the area of thickening in the left colon, solid enhancing left renal lesion ? Elevated preoperative CEA ? CT 11/15/2018-irregular rectal wall thickening/perirectal stranding, polypoid defect in the mid transverse colon proximal to the colostomy-indeterminate, nodularity of the prostate with a hyperdense component in the median lobe-stable ? MRI pelvis 02/01/2019-T3BN1 rectal tumor at 4.5 cm from the internal anal sphincter, 11.5 cm in length ? Colonoscopy 02/22/2019-malignant completely obstructing tumor in the proximal  rectum. Polyps in the mid and distal rectum; polyps in the ascending, transverse/distal transverse colon. 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 Rectum biopsy shows high-grade dysplasia with focal features suspicious for adenocarcinoma. ? Radiation/Xeloda 03/06/2019-04/12/2019 ? MRI pelvis 08/17/2019- 4.8 cm apple core lesion at the rectosigmoid junction, T3B N0; distance from tumor to the internal anal sphincter is 11.5 cm.   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 BPH  4. History of tobacco use  5.History of an irregular mole at the right upper back  6. Clinical stage III rectal cancer ? Colonoscopy 02/22/2019-malignant completely obstructing tumor in the proximal rectum. Polyps in the mid and distal rectum; polyps in the ascending, transverse/distal transverse colon. 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 Rectum biopsy shows high-grade dysplasia with focal features suspicious for adenocarcinoma. ? Radiation/Xeloda 03/06/2019-04/12/2019 ? Flexible sigmoidoscopy 06/11/2019-infiltrative and ulcerated completely obstructing large mass found in the rectosigmoid colon/very proximal rectum.  Mass circumferential.  No bleeding present.  Lesion now less fungating but still large in size.  25 mm polyp found in the proximal rectum.  12 mm polyp found in the distal rectum.  7.  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  Tumor appears to involve superficial submucosa. Tumor present at cauterized resection margin (polyp base).  No evidence of lymphovascular invasion.  Other polyp showing tubulovillous adenoma with focal high-grade dysplasia.    Disposition: [facility] Chambers appears unchanged.  He is being considered for surgery.  The plan is for a flexible sigmoidoscopy in the near future.  He will return for a follow-up visit  here in 6 weeks.  He will contact the office in the interim with any problems.  Patient seen with Dr. Benay Spice.    [name] ANP/GNP-BC   09/13/2019  11:38 AM  This was a shared visit with [name].  We reviewed the MRI with Shawn Chambers.  He appears to have a localized rectal tumor.  He may be a candidate for curative surgery.  He is now followed by Dr. Marcello Moores.   [name], MD

## 2019-09-14 ENCOUNTER — Telehealth: Payer: Self-pay | Admitting: Oncology

## 2019-09-14 NOTE — Telephone Encounter (Signed)
Called and spoke with patients daughter. Confirmed appt  °

## 2019-09-25 ENCOUNTER — Other Ambulatory Visit (HOSPITAL_COMMUNITY)
Admission: RE | Admit: 2019-09-25 | Discharge: 2019-09-25 | Disposition: A | Discharge status: Home / Self Care | Payer: Medicare HMO | Source: Ambulatory Visit | Attending: General Surgery | Admitting: General Surgery

## 2019-09-25 DIAGNOSIS — Z01812 Encounter for preprocedural laboratory examination: Secondary | ICD-10-CM | POA: Diagnosis present

## 2019-09-25 DIAGNOSIS — Z20828 Contact with and (suspected) exposure to other viral communicable diseases: Secondary | ICD-10-CM | POA: Diagnosis not present

## 2019-09-26 LAB — NOVEL CORONAVIRUS, NAA (HOSP ORDER, SEND-OUT TO REF LAB; TAT 18-24 HRS): SARS-CoV-2, NAA: NOT DETECTED

## 2019-09-27 NOTE — Progress Notes (Signed)
Pre-op endo call attempted. Per DPR , RN called patient daughter [name] . 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 She says she may be able to call RN back after she gets off work. RN informed [name] that we will be closing today by 5:pm at the latest and if she is not able to call us before 5pm she should refer to the instructions received from Dr Marcello Moores' office

## 2019-09-27 NOTE — Progress Notes (Signed)
Attempted to speak with patient, phone number was for daughter. Stated patient has been quarantined since test.

## 2019-09-28 ENCOUNTER — Ambulatory Visit (HOSPITAL_COMMUNITY): Admission: RE | Admit: 2019-09-28 | Payer: Medicare HMO | Source: Home / Self Care | Admitting: General Surgery

## 2019-09-28 ENCOUNTER — Encounter (HOSPITAL_COMMUNITY): Admission: RE | Payer: Self-pay | Source: Home / Self Care

## 2019-09-28 SURGERY — SIGMOIDOSCOPY, FLEXIBLE
Anesthesia: IV Sedation (MBSC Only)

## 2019-09-28 NOTE — Progress Notes (Signed)
pts daughter called and stated that he woke up not feeling well and was not well enough to come to procedure today. Pts daughter stated she called Dr. Marcello Moores' office this morning and has let them know. She stated the patient was going to call his primary to be seen

## 2019-10-05 NOTE — Progress Notes (Signed)
Pre-op endo call attempted. No answer. lmtcb 

## 2019-10-06 ENCOUNTER — Other Ambulatory Visit (HOSPITAL_COMMUNITY): Payer: Medicare HMO

## 2019-10-09 NOTE — Progress Notes (Signed)
Called and talked with nurse in Dr. Marcello Moores' office to let them know that patient did not come to get a covid test yesterday as he should have. Have been multiple attempts to reach the patient regarding the procedure and have not been able to reach him and number in his chart. I left a message with his daughter on her voice mail regarding the above and to please call and reschedule this procedure with dr. Marcello Moores and told her that the patient needed to have a covid test prior to the procedure. Number left on her voicemail

## 2019-10-10 ENCOUNTER — Ambulatory Visit (HOSPITAL_COMMUNITY): Admission: RE | Admit: 2019-10-10 | Payer: Medicare HMO | Source: Home / Self Care | Admitting: General Surgery

## 2019-10-10 ENCOUNTER — Encounter (HOSPITAL_COMMUNITY): Admission: RE | Payer: Self-pay | Source: Home / Self Care

## 2019-10-10 ENCOUNTER — Ambulatory Visit: Payer: Self-pay | Admitting: General Surgery

## 2019-10-10 SURGERY — SIGMOIDOSCOPY, FLEXIBLE
Anesthesia: IV Sedation (MBSC Only)

## 2019-10-12 ENCOUNTER — Other Ambulatory Visit: Payer: Self-pay | Admitting: Urology

## 2019-10-12 NOTE — Patient Instructions (Addendum)
DUE TO COVID-19 ONLY ONE VISITOR IS ALLOWED TO COME WITH YOU AND STAY IN THE WAITING ROOM ONLY DURING PRE OP AND PROCEDURE DAY OF SURGERY. THE 1 VISITOR MAY VISIT WITH YOU AFTER SURGERY IN YOUR PRIVATE ROOM DURING VISITING HOURS ONLY!  YOU NEED TO HAVE A COVID 19 TEST ON 10-15-19. PLEASE CONTINUE THE QUARANTINE INSTRUCTIONS AS OUTLINED IN YOUR HANDOUT.                Shawn Chambers  10/12/2019   Your procedure is scheduled on: 10-18-19    Report to [facility] Main  Entrance    Report to Short Stay at 5:30 AM     Call this number if you have problems the morning of surgery [phone]    Remember: Please consume a Clear Liquid Diet the Day of Prep.   DRINK 2 PRESURGERY ENSURE DRINKS THE NIGHT BEFORE SURGERY AT 1000 PM AND 1 PRESURGERY DRINK THE DAY OF THE PROCEDURE 3 HOURS PRIOR TO SCHEDULED SURGERY. NO SOLIDS AFTER MIDNIGHT THE DAY PRIOR TO THE SURGERY. NOTHING BY MOUTH EXCEPT CLEAR LIQUIDS UNTIL THREE HOURS PRIOR TO SCHEDULED SURGERY. PLEASE FINISH PRESURGERY ENSURE DRINK PER SURGEON ORDER 3 HOURS PRIOR TO SCHEDULED SURGERY TIME WHICH NEEDS TO BE COMPLETED AT 4:15 AM.    CLEAR LIQUID DIET   Foods Allowed                                                                     Foods Excluded  Coffee and tea, regular and decaf                             liquids that you cannot  Plain Jell-O any favor except red or purple                                           see through such as: Fruit ices (not with fruit pulp)                                     milk, soups, orange juice  Iced Popsicles                                    All solid food Carbonated beverages, regular and diet                                    Cranberry, grape and apple juices Sports drinks like Gatorade Lightly seasoned clear broth or consume(fat free) Sugar, honey syrup  Sample Menu Breakfast                                Lunch  Supper Cranberry juice                     Beef broth                            Chicken broth Jell-O                                     Grape juice                           Apple juice Coffee or tea                        Jell-O                                      Popsicle                                                Coffee or tea                        Coffee or tea  _____________________________________________________________________       Take these medicines the morning of surgery with A SIP OF WATER: None  BRUSH YOUR TEETH MORNING OF SURGERY AND RINSE YOUR MOUTH OUT, NO CHEWING GUM CANDY OR MINTS.                                 You may not have any metal on your body including hair pins and              piercings     Do not wear jewelry, make-up, lotions, powders or perfumes, deodorant              Men may shave face and neck.   Do not bring valuables to the hospital. [place].  Contacts, dentures or bridgework may not be worn into surgery.  You may bring an overnight bag if you like     Special Instructions: N/A              Please read over the following fact sheets you were given: _____________________________________________________________________             [facility] - Preparing for Surgery Before surgery, you can play an important role.  Because skin is not sterile, your skin needs to be as free of germs as possible.  You can reduce the number of germs on your skin by washing with CHG (chlorahexidine gluconate) soap before surgery.  CHG is an antiseptic cleaner which kills germs and bonds with the skin to continue killing germs even after washing. Please DO NOT use if you have an allergy to CHG or antibacterial soaps.  If your skin becomes reddened/irritated stop using the CHG and inform your nurse when you arrive at Short Stay. Do not shave (including legs and underarms)  for at least 48 hours prior to the first CHG shower.  You may shave  your face/neck. Please follow these instructions carefully:  1.  Shower with CHG Soap the night before surgery and the  morning of Surgery.  2.  If you choose to wash your hair, wash your hair first as usual with your  normal  shampoo.  3.  After you shampoo, rinse your hair and body thoroughly to remove the  shampoo.                           4.  Use CHG as you would any other liquid soap.  You can apply chg directly  to the skin and wash                       Gently with a scrungie or clean washcloth.  5.  Apply the CHG Soap to your body ONLY FROM THE NECK DOWN.   Do not use on face/ open                           Wound or open sores. Avoid contact with eyes, ears mouth and genitals (private parts).                       Wash face,  Genitals (private parts) with your normal soap.             6.  Wash thoroughly, paying special attention to the area where your surgery  will be performed.  7.  Thoroughly rinse your body with warm water from the neck down.  8.  DO NOT shower/wash with your normal soap after using and rinsing off  the CHG Soap.                9.  Pat yourself dry with a clean towel.            10.  Wear clean pajamas.            11.  Place clean sheets on your bed the night of your first shower and do not  sleep with pets. Day of Surgery : Do not apply any lotions/deodorants the morning of surgery.  Please wear clean clothes to the hospital/surgery center.  FAILURE TO FOLLOW THESE INSTRUCTIONS MAY RESULT IN THE CANCELLATION OF YOUR SURGERY PATIENT SIGNATURE_________________________________  NURSE SIGNATURE__________________________________  ________________________________________________________________________  WHAT IS A BLOOD TRANSFUSION? Blood Transfusion Information  A transfusion is the replacement of blood or some of its parts. Blood is made up of multiple cells which provide different functions.  Red blood cells carry oxygen and are used for blood loss  replacement.  White blood cells fight against infection.  Platelets control bleeding.  Plasma helps clot blood.  Other blood products are available for specialized needs, such as hemophilia or other clotting disorders. BEFORE THE TRANSFUSION  Who gives blood for transfusions?   Healthy volunteers who are fully evaluated to make sure their blood is safe. This is blood bank blood. Transfusion therapy is the safest it has ever been in the practice of medicine. Before blood is taken from a donor, a complete history is taken to make sure that person has no history of diseases nor engages in risky social behavior (examples are intravenous drug use or sexual activity with multiple partners). The donor's travel history is screened to minimize risk of  transmitting infections, such as malaria. The donated blood is tested for signs of infectious diseases, such as HIV and hepatitis. The blood is then tested to be sure it is compatible with you in order to minimize the chance of a transfusion reaction. If you or a relative donates blood, this is often done in anticipation of surgery and is not appropriate for emergency situations. It takes many days to process the donated blood. RISKS AND COMPLICATIONS Although transfusion therapy is very safe and saves many lives, the main dangers of transfusion include:   Getting an infectious disease.  Developing a transfusion reaction. This is an allergic reaction to something in the blood you were given. Every precaution is taken to prevent this. The decision to have a blood transfusion has been considered carefully by your caregiver before blood is given. Blood is not given unless the benefits outweigh the risks. AFTER THE TRANSFUSION  Right after receiving a blood transfusion, you will usually feel much better and more energetic. This is especially true if your red blood cells have gotten low (anemic). The transfusion raises the level of the red blood cells which  carry oxygen, and this usually causes an energy increase.  The nurse administering the transfusion will monitor you carefully for complications. HOME CARE INSTRUCTIONS  No special instructions are needed after a transfusion. You may find your energy is better. Speak with your caregiver about any limitations on activity for underlying diseases you may have. SEEK MEDICAL CARE IF:   Your condition is not improving after your transfusion.  You develop redness or irritation at the intravenous (IV) site. SEEK IMMEDIATE MEDICAL CARE IF:  Any of the following symptoms occur over the next 12 hours:  Shaking chills.  You have a temperature by mouth above 102 F (38.9 C), not controlled by medicine.  Chest, back, or muscle pain.  People around you feel you are not acting correctly or are confused.  Shortness of breath or difficulty breathing.  Dizziness and fainting.  You get a rash or develop hives.  You have a decrease in urine output.  Your urine turns a dark color or changes to pink, red, or brown. Any of the following symptoms occur over the next 10 days:  You have a temperature by mouth above 102 F (38.9 C), not controlled by medicine.  Shortness of breath.  Weakness after normal activity.  The white part of the eye turns yellow (jaundice).  You have a decrease in the amount of urine or are urinating less often.  Your urine turns a dark color or changes to pink, red, or brown. Document Released: 11/26/2000 Document Revised: 02/21/2012 Document Reviewed: 07/15/2008 [facility] Patient Information 2014 [place], [place].  _______________________________________________________________________

## 2019-10-15 ENCOUNTER — Other Ambulatory Visit: Payer: Self-pay

## 2019-10-15 ENCOUNTER — Encounter (HOSPITAL_COMMUNITY): Payer: Self-pay

## 2019-10-15 ENCOUNTER — Other Ambulatory Visit (HOSPITAL_COMMUNITY): Payer: Medicare HMO

## 2019-10-15 ENCOUNTER — Other Ambulatory Visit (HOSPITAL_COMMUNITY)
Admission: RE | Admit: 2019-10-15 | Discharge: 2019-10-15 | Disposition: A | Discharge status: Home / Self Care | Payer: Medicare HMO | Source: Ambulatory Visit | Attending: General Surgery | Admitting: General Surgery

## 2019-10-15 ENCOUNTER — Encounter (HOSPITAL_COMMUNITY)
Admission: RE | Admit: 2019-10-15 | Discharge: 2019-10-15 | Disposition: A | Discharge status: Home / Self Care | Payer: Medicare HMO | Source: Ambulatory Visit | Attending: General Surgery | Admitting: General Surgery

## 2019-10-15 DIAGNOSIS — C20 Malignant neoplasm of rectum: Secondary | ICD-10-CM | POA: Insufficient documentation

## 2019-10-15 DIAGNOSIS — Z20828 Contact with and (suspected) exposure to other viral communicable diseases: Secondary | ICD-10-CM | POA: Insufficient documentation

## 2019-10-15 DIAGNOSIS — Z01812 Encounter for preprocedural laboratory examination: Secondary | ICD-10-CM | POA: Insufficient documentation

## 2019-10-15 LAB — CBC
HCT: 37.4 % — ABNORMAL LOW (ref 39.0–52.0)
Hemoglobin: 12.3 g/dL — ABNORMAL LOW (ref 13.0–17.0)
MCH: 30.7 pg (ref 26.0–34.0)
MCHC: 32.9 g/dL (ref 30.0–36.0)
MCV: 93.3 fL (ref 80.0–100.0)
Platelets: 136 10*3/uL — ABNORMAL LOW (ref 150–400)
RBC: 4.01 MIL/uL — ABNORMAL LOW (ref 4.22–5.81)
RDW: 12.9 % (ref 11.5–15.5)
WBC: 5.7 10*3/uL (ref 4.0–10.5)
nRBC: 0 % (ref 0.0–0.2)

## 2019-10-15 LAB — BASIC METABOLIC PANEL
Anion gap: 7 (ref 5–15)
BUN: 23 mg/dL (ref 8–23)
CO2: 26 mmol/L (ref 22–32)
Calcium: 9.4 mg/dL (ref 8.9–10.3)
Chloride: 107 mmol/L (ref 98–111)
Creatinine, Ser: 1.3 mg/dL — ABNORMAL HIGH (ref 0.61–1.24)
GFR calc Af Amer: >60 mL/min (ref >60)
GFR calc non Af Amer: 52 mL/min — ABNORMAL LOW (ref >60)
Glucose, Bld: 100 mg/dL — ABNORMAL HIGH (ref 70–99)
Potassium: 4.6 mmol/L (ref 3.5–5.1)
Sodium: 140 mmol/L (ref 135–145)

## 2019-10-15 LAB — ABO/RH: ABO/RH(D): A POS

## 2019-10-16 ENCOUNTER — Ambulatory Visit: Payer: Self-pay | Admitting: General Surgery

## 2019-10-16 LAB — NOVEL CORONAVIRUS, NAA (HOSP ORDER, SEND-OUT TO REF LAB; TAT 18-24 HRS): SARS-CoV-2, NAA: NOT DETECTED

## 2019-10-16 NOTE — H&P (View-Only) (Signed)
History of Present Illness  The patient is a 78 year old male presenting for a post-operative visit. 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 No adjuvant chemotherapy was recommended. He was then diagnosed with a rectal cancer and underwent chemotherapy and radiation finishing in April 2020. He saw my partner, Dr. Dema Severin, who recommended abdominal peroneal resection. He did not wish to undergo the surgery. He has a persistently elevated CEA level. He was told by his oncologist that he needs surgery or chemotherapy. He is having some prolapse of his stoma but it is working fine. He was intermittently passing some mucus from below but this is better now. No significant pain. His diet is going well. he underwent an magnetic resonance imaging to reevaluate his rectal tumor. This was read as a T3b N0 tumor approximately 11 cm from the sphincter complex. The anterior border is close at the level of the bladder but there is no direct invasion noted.  Past Medical History:  Diagnosis Date  . Arthritis    hand, knee- ? Dx.  Marland Kitchen BPH (benign prostatic hyperplasia)   . Cataract    removed  . GERD (gastroesophageal reflux disease)   . Glaucoma   . Hearing loss   . Rectal adenocarcinoma ([place])   . Renal cancer ([place])   . Seizures ([place])    resolved- daughter states she didnt know he ever had seizures   Past Surgical History:  Procedure Laterality Date  . APPENDECTOMY    . COLONOSCOPY    . PARTIAL COLECTOMY N/A 03/28/2017   Procedure: PARTIAL COLECTOMY/COLOSTOMY;  Surgeon: Georganna Skeans, MD;  Location: [place];  Service: General;  Laterality: N/A;  Partial Colectomy, Mobilization of Splenic Flexure, Colostomy  . PARTIAL NEPHRECTOMY Left 03/28/2017   Procedure: NEPHRECTOMY TOTAL;  Surgeon: Raynelle Bring, MD;  Location: [place];  Service: Urology;  Laterality: Left;   Social History   Socioeconomic History  . Marital  status: Single    Spouse name: Not on file  . Number of children: Not on file  . Years of education: Not on file  . Highest education level: Not on file  Occupational History  . Not on file  Social Needs  . Financial resource strain: Not on file  . Food insecurity    Worry: Not on file    Inability: Not on file  . Transportation needs    Medical: No    Non-medical: No  Tobacco Use  . Smoking status: Former Smoker    Packs/day: 1.00    Years: 60.00    Pack years: 60.00    Types: Cigarettes    Quit date: 03/14/2017    Years since quitting: 2.5  . Smokeless tobacco: Never Used  Substance and Sexual Activity  . Alcohol use: No  . Drug use: No  . Sexual activity: Not on file  Lifestyle  . Physical activity    Days per week: Not on file    Minutes per session: Not on file  . Stress: Not on file  Relationships  . Social Herbalist on phone: Not on file    Gets together: Not on file    Attends religious service: Not on file    Active member of club or organization: Not on file    Attends meetings of clubs or organizations: Not on file    Relationship status: Not on file  . Intimate partner violence    Fear  of current or ex partner: Not on file    Emotionally abused: Not on file    Physically abused: Not on file    Forced sexual activity: Not on file  Other Topics Concern  . Not on file  Social History Narrative  . Not on file   Family History  Problem Relation Age of Onset  . Esophageal cancer Paternal Uncle   . Colon polyps Daughter   . Colon cancer Neg Hx   . Rectal cancer Neg Hx   . Stomach cancer Neg Hx     Allergies  Crestor *ANTIHYPERLIPIDEMICS*  Allergies Reconciled   Medication History  Aspirin (325MG  Tablet, Oral) Active. Latanoprost (0.005% Solution, Ophthalmic) Active. Folic Acid (1MG  Tablet, Oral) Active. Medications Reconciled  Vitals Sabino Gasser CMA; 10/16/2019 12:14 PM) 10/16/2019 12:13 PM Weight: 183.8 lb Height:  70in Body Surface Area: 2.01 m Body Mass Index: 26.37 kg/m  Temp.: 97.91F(Oral)  Pulse: 98 (Regular)  BP: 144/84 (Sitting, Left Arm, Standard)    Physical Exam  The physical exam findings are as follows: Note:Constitutional: No acute distress; conversant; no deformities Eyes: Moist conjunctiva; no lid lag; anicteric sclerae; pupils equal round and reactive to light Neck: Trachea midline; no palpable thyromegaly Lungs: Normal respiratory effort; no tactile fremitus CV: Regular rate and rhythm; no palpable thrill; no pitting edema GI: Abdomen obese, soft, nontender, nondistended;  Colostomy Left upper quadrant with 10 cm of reducible prolapse. Mucosa all healthy and pink. Gas and stool in appliance. Midline wound well healed. MSK: Normal gait; no clubbing/cyanosis Psychiatric: Appropriate affect; alert and oriented 3 Lymphatic: No palpable cervical or axillary lymphadenopathy    Assessment & Plan Leighton Ruff MD; [id number] 10:18 AM) RECTAL CANCER (C20) Impression: 78 year old male with rectal cancer status post chemotherapy and radiation in April 2020. He was lost to follow-up and has now represented. Repeat magnetic resonance imaging shows a decrease in tumor mass and no signs of pelvic organ invasion. We discussed the difficulty of this resection given the proximity to the bladder and prostate and seminal vesicles. Given that he is high risk due to the location of the tumor and the time after radiation, we have decided to proceed with a flexible sigmoidoscopy to evaluate for definitive signs of continued cancer. in the meantime we'll work on getting him set up for a robotic proctectomy with colostomy revision. The anatomy & physiology of hepatobiliary & pancreatic function was discussed.  The pathophysiology of gallbladder dysfunction was discussed.  Natural history risks without surgery was discussed.   I feel the risks of no intervention will lead to serious problems that  outweigh the operative risks; therefore, I recommended cholecystectomy to remove the pathology.  I explained laparoscopic techniques with possible need for an open approach.  Probable cholangiogram to evaluate the bilary tract was explained as well.    Risks such as bleeding, infection, abscess, leak, injury to other organs, need for further treatment, heart attack, death, and other risks were discussed.  I noted a good likelihood this will help address the problem.  Possibility that this will not correct all abdominal symptoms was explained.  Goals of post-operative recovery were discussed as well.  We will work to minimize complications.  An educational handout further explaining the pathology and treatment options was given as well.  Questions were answered.  The patient expresses understanding & wishes to proceed with surgery.

## 2019-10-16 NOTE — H&P (Signed)
History of Present Illness  The patient is a 78 year old male presenting for a post-operative visit. 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 No adjuvant chemotherapy was recommended. He was then diagnosed with a rectal cancer and underwent chemotherapy and radiation finishing in April 2020. He saw my partner, Dr. Dema Severin, who recommended abdominal peroneal resection. He did not wish to undergo the surgery. He has a persistently elevated CEA level. He was told by his oncologist that he needs surgery or chemotherapy. He is having some prolapse of his stoma but it is working fine. He was intermittently passing some mucus from below but this is better now. No significant pain. His diet is going well. he underwent an magnetic resonance imaging to reevaluate his rectal tumor. This was read as a T3b N0 tumor approximately 11 cm from the sphincter complex. The anterior border is close at the level of the bladder but there is no direct invasion noted.  Past Medical History:  Diagnosis Date  . Arthritis    hand, knee- ? Dx.  Marland Kitchen BPH (benign prostatic hyperplasia)   . Cataract    removed  . GERD (gastroesophageal reflux disease)   . Glaucoma   . Hearing loss   . Rectal adenocarcinoma ([place])   . Renal cancer ([place])   . Seizures ([place])    resolved- daughter states she didnt know he ever had seizures   Past Surgical History:  Procedure Laterality Date  . APPENDECTOMY    . COLONOSCOPY    . PARTIAL COLECTOMY N/A 03/28/2017   Procedure: PARTIAL COLECTOMY/COLOSTOMY;  Surgeon: Georganna Skeans, MD;  Location: [place];  Service: General;  Laterality: N/A;  Partial Colectomy, Mobilization of Splenic Flexure, Colostomy  . PARTIAL NEPHRECTOMY Left 03/28/2017   Procedure: NEPHRECTOMY TOTAL;  Surgeon: Raynelle Bring, MD;  Location: [place];  Service: Urology;  Laterality: Left;   Social History   Socioeconomic History  . Marital  status: Single    Spouse name: Not on file  . Number of children: Not on file  . Years of education: Not on file  . Highest education level: Not on file  Occupational History  . Not on file  Social Needs  . Financial resource strain: Not on file  . Food insecurity    Worry: Not on file    Inability: Not on file  . Transportation needs    Medical: No    Non-medical: No  Tobacco Use  . Smoking status: Former Smoker    Packs/day: 1.00    Years: 60.00    Pack years: 60.00    Types: Cigarettes    Quit date: 03/14/2017    Years since quitting: 2.5  . Smokeless tobacco: Never Used  Substance and Sexual Activity  . Alcohol use: No  . Drug use: No  . Sexual activity: Not on file  Lifestyle  . Physical activity    Days per week: Not on file    Minutes per session: Not on file  . Stress: Not on file  Relationships  . Social Herbalist on phone: Not on file    Gets together: Not on file    Attends religious service: Not on file    Active member of club or organization: Not on file    Attends meetings of clubs or organizations: Not on file    Relationship status: Not on file  . Intimate partner violence    Fear  of current or ex partner: Not on file    Emotionally abused: Not on file    Physically abused: Not on file    Forced sexual activity: Not on file  Other Topics Concern  . Not on file  Social History Narrative  . Not on file   Family History  Problem Relation Age of Onset  . Esophageal cancer Paternal Uncle   . Colon polyps Daughter   . Colon cancer Neg Hx   . Rectal cancer Neg Hx   . Stomach cancer Neg Hx     Allergies  Crestor *ANTIHYPERLIPIDEMICS*  Allergies Reconciled   Medication History  Aspirin (325MG  Tablet, Oral) Active. Latanoprost (0.005% Solution, Ophthalmic) Active. Folic Acid (1MG  Tablet, Oral) Active. Medications Reconciled  Vitals Sabino Gasser CMA; 10/16/2019 12:14 PM) 10/16/2019 12:13 PM Weight: 183.8 lb Height:  70in Body Surface Area: 2.01 m Body Mass Index: 26.37 kg/m  Temp.: 97.61F(Oral)  Pulse: 98 (Regular)  BP: 144/84 (Sitting, Left Arm, Standard)    Physical Exam  The physical exam findings are as follows: Note:Constitutional: No acute distress; conversant; no deformities Eyes: Moist conjunctiva; no lid lag; anicteric sclerae; pupils equal round and reactive to light Neck: Trachea midline; no palpable thyromegaly Lungs: Normal respiratory effort; no tactile fremitus CV: Regular rate and rhythm; no palpable thrill; no pitting edema GI: Abdomen obese, soft, nontender, nondistended;  Colostomy Left upper quadrant with 10 cm of reducible prolapse. Mucosa all healthy and pink. Gas and stool in appliance. Midline wound well healed. MSK: Normal gait; no clubbing/cyanosis Psychiatric: Appropriate affect; alert and oriented 3 Lymphatic: No palpable cervical or axillary lymphadenopathy    Assessment & Plan Leighton Ruff MD; [id number] 10:18 AM) RECTAL CANCER (C20) Impression: 78 year old male with rectal cancer status post chemotherapy and radiation in April 2020. He was lost to follow-up and has now represented. Repeat magnetic resonance imaging shows a decrease in tumor mass and no signs of pelvic organ invasion. We discussed the difficulty of this resection given the proximity to the bladder and prostate and seminal vesicles. Given that he is high risk due to the location of the tumor and the time after radiation, we have decided to proceed with a flexible sigmoidoscopy to evaluate for definitive signs of continued cancer. in the meantime we'll work on getting him set up for a robotic proctectomy with colostomy revision. The anatomy & physiology of hepatobiliary & pancreatic function was discussed.  The pathophysiology of gallbladder dysfunction was discussed.  Natural history risks without surgery was discussed.   I feel the risks of no intervention will lead to serious problems that  outweigh the operative risks; therefore, I recommended cholecystectomy to remove the pathology.  I explained laparoscopic techniques with possible need for an open approach.  Probable cholangiogram to evaluate the bilary tract was explained as well.    Risks such as bleeding, infection, abscess, leak, injury to other organs, need for further treatment, heart attack, death, and other risks were discussed.  I noted a good likelihood this will help address the problem.  Possibility that this will not correct all abdominal symptoms was explained.  Goals of post-operative recovery were discussed as well.  We will work to minimize complications.  An educational handout further explaining the pathology and treatment options was given as well.  Questions were answered.  The patient expresses understanding & wishes to proceed with surgery.

## 2019-10-17 MED ORDER — BUPIVACAINE LIPOSOME 1.3 % IJ SUSP
20.0000 mL | Freq: Once | INTRAMUSCULAR | Status: DC
  Filled 2019-10-17: qty 20

## 2019-10-18 ENCOUNTER — Other Ambulatory Visit: Payer: Self-pay

## 2019-10-18 ENCOUNTER — Inpatient Hospital Stay (HOSPITAL_COMMUNITY): Payer: Medicare HMO | Admitting: Physician Assistant

## 2019-10-18 ENCOUNTER — Inpatient Hospital Stay (HOSPITAL_COMMUNITY): Payer: Medicare HMO

## 2019-10-18 ENCOUNTER — Inpatient Hospital Stay (HOSPITAL_COMMUNITY)
Admission: RE | Admit: 2019-10-18 | Discharge: 2019-10-23 | DRG: 330 | Disposition: A | Discharge status: Home Health | Payer: Medicare HMO | Attending: General Surgery | Admitting: General Surgery

## 2019-10-18 ENCOUNTER — Encounter (HOSPITAL_COMMUNITY)
Admission: RE | Disposition: A | Discharge status: Home Health | Payer: Self-pay | Source: Home / Self Care | Attending: General Surgery

## 2019-10-18 ENCOUNTER — Encounter (HOSPITAL_COMMUNITY): Payer: Self-pay

## 2019-10-18 ENCOUNTER — Inpatient Hospital Stay (HOSPITAL_COMMUNITY): Payer: Medicare HMO | Admitting: Anesthesiology

## 2019-10-18 DIAGNOSIS — Z9119 Patient's noncompliance with other medical treatment and regimen: Secondary | ICD-10-CM | POA: Diagnosis not present

## 2019-10-18 DIAGNOSIS — K435 Parastomal hernia without obstruction or  gangrene: Secondary | ICD-10-CM | POA: Diagnosis present

## 2019-10-18 DIAGNOSIS — Z800 Family history of malignant neoplasm of digestive organs: Secondary | ICD-10-CM

## 2019-10-18 DIAGNOSIS — K219 Gastro-esophageal reflux disease without esophagitis: Secondary | ICD-10-CM | POA: Diagnosis present

## 2019-10-18 DIAGNOSIS — Z9111 Patient's noncompliance with dietary regimen: Secondary | ICD-10-CM

## 2019-10-18 DIAGNOSIS — N400 Enlarged prostate without lower urinary tract symptoms: Secondary | ICD-10-CM | POA: Diagnosis present

## 2019-10-18 DIAGNOSIS — F17210 Nicotine dependence, cigarettes, uncomplicated: Secondary | ICD-10-CM | POA: Diagnosis present

## 2019-10-18 DIAGNOSIS — C775 Secondary and unspecified malignant neoplasm of intrapelvic lymph nodes: Secondary | ICD-10-CM | POA: Diagnosis present

## 2019-10-18 DIAGNOSIS — H9190 Unspecified hearing loss, unspecified ear: Secondary | ICD-10-CM | POA: Diagnosis present

## 2019-10-18 DIAGNOSIS — C186 Malignant neoplasm of descending colon: Secondary | ICD-10-CM | POA: Diagnosis present

## 2019-10-18 DIAGNOSIS — N183 Chronic kidney disease, stage 3 unspecified: Secondary | ICD-10-CM | POA: Diagnosis present

## 2019-10-18 DIAGNOSIS — R7303 Prediabetes: Secondary | ICD-10-CM | POA: Diagnosis present

## 2019-10-18 DIAGNOSIS — Z8371 Family history of colonic polyps: Secondary | ICD-10-CM

## 2019-10-18 DIAGNOSIS — Z87898 Personal history of other specified conditions: Secondary | ICD-10-CM

## 2019-10-18 DIAGNOSIS — Z905 Acquired absence of kidney: Secondary | ICD-10-CM | POA: Diagnosis not present

## 2019-10-18 DIAGNOSIS — Z9221 Personal history of antineoplastic chemotherapy: Secondary | ICD-10-CM | POA: Diagnosis not present

## 2019-10-18 DIAGNOSIS — Y842 Radiological procedure and radiotherapy as the cause of abnormal reaction of the patient, or of later complication, without mention of misadventure at the time of the procedure: Secondary | ICD-10-CM | POA: Diagnosis present

## 2019-10-18 DIAGNOSIS — C20 Malignant neoplasm of rectum: Principal | ICD-10-CM | POA: Diagnosis present

## 2019-10-18 DIAGNOSIS — K9409 Other complications of colostomy: Secondary | ICD-10-CM | POA: Diagnosis present

## 2019-10-18 DIAGNOSIS — Z20828 Contact with and (suspected) exposure to other viral communicable diseases: Secondary | ICD-10-CM | POA: Diagnosis present

## 2019-10-18 DIAGNOSIS — Y839 Surgical procedure, unspecified as the cause of abnormal reaction of the patient, or of later complication, without mention of misadventure at the time of the procedure: Secondary | ICD-10-CM | POA: Diagnosis present

## 2019-10-18 DIAGNOSIS — R569 Unspecified convulsions: Secondary | ICD-10-CM | POA: Diagnosis present

## 2019-10-18 DIAGNOSIS — H409 Unspecified glaucoma: Secondary | ICD-10-CM | POA: Diagnosis present

## 2019-10-18 DIAGNOSIS — R1310 Dysphagia, unspecified: Secondary | ICD-10-CM | POA: Diagnosis not present

## 2019-10-18 DIAGNOSIS — K660 Peritoneal adhesions (postprocedural) (postinfection): Secondary | ICD-10-CM | POA: Diagnosis present

## 2019-10-18 DIAGNOSIS — Z9049 Acquired absence of other specified parts of digestive tract: Secondary | ICD-10-CM

## 2019-10-18 DIAGNOSIS — Z91199 Patient's noncompliance with other medical treatment and regimen due to unspecified reason: Secondary | ICD-10-CM

## 2019-10-18 DIAGNOSIS — Z85528 Personal history of other malignant neoplasm of kidney: Secondary | ICD-10-CM | POA: Diagnosis not present

## 2019-10-18 DIAGNOSIS — R739 Hyperglycemia, unspecified: Secondary | ICD-10-CM | POA: Diagnosis present

## 2019-10-18 DIAGNOSIS — D638 Anemia in other chronic diseases classified elsewhere: Secondary | ICD-10-CM | POA: Diagnosis present

## 2019-10-18 HISTORY — PX: CYSTOSCOPY WITH STENT PLACEMENT: SHX5790

## 2019-10-18 HISTORY — PX: COLOSTOMY REVISION: SHX5232

## 2019-10-18 HISTORY — PX: XI ROBOTIC ASSISTED LOWER ANTERIOR RESECTION: SHX6558

## 2019-10-18 LAB — TYPE AND SCREEN
ABO/RH(D): A POS
Antibody Screen: NEGATIVE

## 2019-10-18 SURGERY — RESECTION, RECTUM, LOW ANTERIOR, ROBOT-ASSISTED
Anesthesia: General | Site: Abdomen

## 2019-10-18 MED ORDER — LIDOCAINE HCL 2 % IJ SOLN
INTRAMUSCULAR | Status: AC
  Filled 2019-10-18: qty 20

## 2019-10-18 MED ORDER — TAMSULOSIN HCL 0.4 MG PO CAPS
0.8000 mg | ORAL_CAPSULE | Freq: Every day | ORAL | Status: DC
  Administered 2019-10-20: 0.8 mg via ORAL
  Filled 2019-10-18 (×5): qty 2

## 2019-10-18 MED ORDER — ONDANSETRON HCL 4 MG/2ML IJ SOLN
INTRAMUSCULAR | Status: AC
  Filled 2019-10-18: qty 2

## 2019-10-18 MED ORDER — ALUM & MAG HYDROXIDE-SIMETH 200-200-20 MG/5ML PO SUSP: 30.0000 mL | Freq: Four times a day (QID) | ORAL | Status: DC | PRN

## 2019-10-18 MED ORDER — DIPHENHYDRAMINE HCL 50 MG/ML IJ SOLN: 12.5000 mg | Freq: Four times a day (QID) | INTRAMUSCULAR | Status: DC | PRN

## 2019-10-18 MED ORDER — ONDANSETRON HCL 4 MG/2ML IJ SOLN: 4.0000 mg | Freq: Four times a day (QID) | INTRAMUSCULAR | Status: DC | PRN

## 2019-10-18 MED ORDER — EPHEDRINE SULFATE 50 MG/ML IJ SOLN
INTRAMUSCULAR | Status: DC | PRN
  Administered 2019-10-18 (×3): 10 mg via INTRAVENOUS
  Administered 2019-10-18: 5 mg via INTRAVENOUS
  Administered 2019-10-18: 10 mg via INTRAVENOUS
  Administered 2019-10-18 (×3): 5 mg via INTRAVENOUS

## 2019-10-18 MED ORDER — BUPIVACAINE LIPOSOME 1.3 % IJ SUSP
INTRAMUSCULAR | Status: DC | PRN
  Administered 2019-10-18: 20 mL

## 2019-10-18 MED ORDER — INDOCYANINE GREEN 25 MG IV SOLR
INTRAVENOUS | Status: DC | PRN
  Administered 2019-10-18: 9.3 mg

## 2019-10-18 MED ORDER — LACTATED RINGERS IR SOLN
Status: DC | PRN
  Administered 2019-10-18: 1000 mL

## 2019-10-18 MED ORDER — TRAMADOL HCL 50 MG PO TABS
50.0000 mg | ORAL_TABLET | Freq: Four times a day (QID) | ORAL | Status: DC | PRN
  Administered 2019-10-19: 50 mg via ORAL
  Filled 2019-10-18: qty 1

## 2019-10-18 MED ORDER — ALVIMOPAN 12 MG PO CAPS
12.0000 mg | ORAL_CAPSULE | ORAL | Status: AC
  Administered 2019-10-18: 12 mg via ORAL
  Filled 2019-10-18: qty 1

## 2019-10-18 MED ORDER — ENOXAPARIN SODIUM 40 MG/0.4ML ~~LOC~~ SOLN
40.0000 mg | SUBCUTANEOUS | Status: DC
  Administered 2019-10-19 – 2019-10-23 (×5): 40 mg via SUBCUTANEOUS
  Filled 2019-10-18 (×5): qty 0.4

## 2019-10-18 MED ORDER — KETAMINE HCL 10 MG/ML IJ SOLN
INTRAMUSCULAR | Status: DC | PRN
  Administered 2019-10-18: 5 mg via INTRAVENOUS
  Administered 2019-10-18: 35 mg via INTRAVENOUS
  Administered 2019-10-18: 5 mg via INTRAVENOUS
  Administered 2019-10-18: 10 mg via INTRAVENOUS
  Administered 2019-10-18: 5 mg via INTRAVENOUS

## 2019-10-18 MED ORDER — FENTANYL CITRATE (PF) 100 MCG/2ML IJ SOLN
INTRAMUSCULAR | Status: AC
  Filled 2019-10-18: qty 2

## 2019-10-18 MED ORDER — LIDOCAINE 2% (20 MG/ML) 5 ML SYRINGE
INTRAMUSCULAR | Status: DC | PRN
  Administered 2019-10-18: 50 mg via INTRAVENOUS

## 2019-10-18 MED ORDER — LATANOPROST 0.005 % OP SOLN
1.0000 [drp] | Freq: Every day | OPHTHALMIC | Status: DC
  Administered 2019-10-18 – 2019-10-22 (×5): 1 [drp] via OPHTHALMIC
  Filled 2019-10-18: qty 2.5

## 2019-10-18 MED ORDER — FENTANYL CITRATE (PF) 100 MCG/2ML IJ SOLN
25.0000 ug | INTRAMUSCULAR | Status: DC | PRN
  Administered 2019-10-18: 50 ug via INTRAVENOUS

## 2019-10-18 MED ORDER — MIDAZOLAM HCL 2 MG/2ML IJ SOLN
INTRAMUSCULAR | Status: AC
  Filled 2019-10-18: qty 2

## 2019-10-18 MED ORDER — BUPIVACAINE HCL (PF) 0.25 % IJ SOLN
INTRAMUSCULAR | Status: DC | PRN
  Administered 2019-10-18: 30 mL

## 2019-10-18 MED ORDER — PROPOFOL 10 MG/ML IV BOLUS
INTRAVENOUS | Status: DC | PRN
  Administered 2019-10-18: 140 mg via INTRAVENOUS

## 2019-10-18 MED ORDER — SACCHAROMYCES BOULARDII 250 MG PO CAPS
250.0000 mg | ORAL_CAPSULE | Freq: Two times a day (BID) | ORAL | Status: DC
  Administered 2019-10-18 – 2019-10-23 (×10): 250 mg via ORAL
  Filled 2019-10-18 (×10): qty 1

## 2019-10-18 MED ORDER — ENSURE SURGERY PO LIQD
237.0000 mL | Freq: Two times a day (BID) | ORAL | Status: DC
  Administered 2019-10-18 – 2019-10-22 (×6): 237 mL via ORAL
  Filled 2019-10-18 (×11): qty 237

## 2019-10-18 MED ORDER — PHENYLEPHRINE 40 MCG/ML (10ML) SYRINGE FOR IV PUSH (FOR BLOOD PRESSURE SUPPORT)
PREFILLED_SYRINGE | INTRAVENOUS | Status: DC | PRN
  Administered 2019-10-18 (×2): 40 ug via INTRAVENOUS
  Administered 2019-10-18 (×2): 80 ug via INTRAVENOUS
  Administered 2019-10-18 (×4): 40 ug via INTRAVENOUS

## 2019-10-18 MED ORDER — EPHEDRINE 5 MG/ML INJ
INTRAVENOUS | Status: AC
  Filled 2019-10-18: qty 10

## 2019-10-18 MED ORDER — FENTANYL CITRATE (PF) 250 MCG/5ML IJ SOLN
INTRAMUSCULAR | Status: AC
  Filled 2019-10-18: qty 5

## 2019-10-18 MED ORDER — GABAPENTIN 300 MG PO CAPS
300.0000 mg | ORAL_CAPSULE | Freq: Two times a day (BID) | ORAL | Status: DC
  Administered 2019-10-19 – 2019-10-23 (×9): 300 mg via ORAL
  Filled 2019-10-18 (×10): qty 1

## 2019-10-18 MED ORDER — BUPIVACAINE HCL (PF) 0.25 % IJ SOLN
INTRAMUSCULAR | Status: AC
  Filled 2019-10-18: qty 30

## 2019-10-18 MED ORDER — ALVIMOPAN 12 MG PO CAPS
12.0000 mg | ORAL_CAPSULE | Freq: Two times a day (BID) | ORAL | Status: DC
  Administered 2019-10-19 – 2019-10-21 (×5): 12 mg via ORAL
  Filled 2019-10-18 (×5): qty 1

## 2019-10-18 MED ORDER — SODIUM CHLORIDE 0.9 % IR SOLN
Status: DC | PRN
  Administered 2019-10-18: 1000 mL

## 2019-10-18 MED ORDER — ROCURONIUM BROMIDE 50 MG/5ML IV SOSY
PREFILLED_SYRINGE | INTRAVENOUS | Status: DC | PRN
  Administered 2019-10-18 (×2): 20 mg via INTRAVENOUS
  Administered 2019-10-18: 10 mg via INTRAVENOUS
  Administered 2019-10-18 (×2): 20 mg via INTRAVENOUS

## 2019-10-18 MED ORDER — FENTANYL CITRATE (PF) 100 MCG/2ML IJ SOLN
INTRAMUSCULAR | Status: DC | PRN
  Administered 2019-10-18: 50 ug via INTRAVENOUS
  Administered 2019-10-18: 25 ug via INTRAVENOUS
  Administered 2019-10-18 (×2): 50 ug via INTRAVENOUS
  Administered 2019-10-18: 25 ug via INTRAVENOUS
  Administered 2019-10-18 (×2): 50 ug via INTRAVENOUS
  Administered 2019-10-18 (×2): 25 ug via INTRAVENOUS

## 2019-10-18 MED ORDER — SODIUM CHLORIDE 0.9 % IV SOLN: 250.0000 mL | INTRAVENOUS | Status: DC

## 2019-10-18 MED ORDER — ROCURONIUM BROMIDE 10 MG/ML (PF) SYRINGE
PREFILLED_SYRINGE | INTRAVENOUS | Status: AC
  Filled 2019-10-18: qty 10

## 2019-10-18 MED ORDER — FENTANYL CITRATE (PF) 100 MCG/2ML IJ SOLN
INTRAMUSCULAR | Status: AC
  Filled 2019-10-18: qty 4

## 2019-10-18 MED ORDER — ONDANSETRON HCL 4 MG/2ML IJ SOLN
4.0000 mg | Freq: Four times a day (QID) | INTRAMUSCULAR | Status: DC | PRN
  Filled 2019-10-18: qty 2

## 2019-10-18 MED ORDER — HYDROMORPHONE HCL 1 MG/ML IJ SOLN: 0.5000 mg | INTRAMUSCULAR | Status: DC | PRN

## 2019-10-18 MED ORDER — SODIUM CHLORIDE 0.9 % IV SOLN
2.0000 g | INTRAVENOUS | Status: AC
  Administered 2019-10-18: 2 g via INTRAVENOUS
  Filled 2019-10-18: qty 2

## 2019-10-18 MED ORDER — PHENYLEPHRINE 40 MCG/ML (10ML) SYRINGE FOR IV PUSH (FOR BLOOD PRESSURE SUPPORT)
PREFILLED_SYRINGE | INTRAVENOUS | Status: AC
  Filled 2019-10-18: qty 10

## 2019-10-18 MED ORDER — OXYCODONE HCL 5 MG PO TABS: 5.0000 mg | ORAL_TABLET | Freq: Once | ORAL | Status: DC | PRN

## 2019-10-18 MED ORDER — KCL IN DEXTROSE-NACL 20-5-0.45 MEQ/L-%-% IV SOLN
INTRAVENOUS | Status: DC
  Administered 2019-10-18: 16:00:00 via INTRAVENOUS
  Filled 2019-10-18 (×2): qty 1000

## 2019-10-18 MED ORDER — SUGAMMADEX SODIUM 500 MG/5ML IV SOLN
INTRAVENOUS | Status: DC | PRN
  Administered 2019-10-18: 300 mg via INTRAVENOUS

## 2019-10-18 MED ORDER — ACETAMINOPHEN 500 MG PO TABS
1000.0000 mg | ORAL_TABLET | ORAL | Status: AC
  Administered 2019-10-18: 1000 mg via ORAL
  Filled 2019-10-18: qty 2

## 2019-10-18 MED ORDER — PROPOFOL 10 MG/ML IV BOLUS
INTRAVENOUS | Status: AC
  Filled 2019-10-18: qty 20

## 2019-10-18 MED ORDER — MIDAZOLAM HCL 5 MG/5ML IJ SOLN
INTRAMUSCULAR | Status: DC | PRN
  Administered 2019-10-18: 1 mg via INTRAVENOUS

## 2019-10-18 MED ORDER — LIDOCAINE 2% (20 MG/ML) 5 ML SYRINGE
INTRAMUSCULAR | Status: DC | PRN
  Administered 2019-10-18: 1.5 mg/kg/h via INTRAVENOUS

## 2019-10-18 MED ORDER — ACETAMINOPHEN 500 MG PO TABS
1000.0000 mg | ORAL_TABLET | Freq: Four times a day (QID) | ORAL | Status: DC
  Administered 2019-10-18 – 2019-10-23 (×15): 1000 mg via ORAL
  Filled 2019-10-18 (×15): qty 2

## 2019-10-18 MED ORDER — STERILE WATER FOR INJECTION IJ SOLN
INTRAMUSCULAR | Status: AC
  Filled 2019-10-18: qty 10

## 2019-10-18 MED ORDER — DIPHENHYDRAMINE HCL 12.5 MG/5ML PO ELIX: 12.5000 mg | ORAL_SOLUTION | Freq: Four times a day (QID) | ORAL | Status: DC | PRN

## 2019-10-18 MED ORDER — GABAPENTIN 300 MG PO CAPS
300.0000 mg | ORAL_CAPSULE | ORAL | Status: AC
  Administered 2019-10-18: 300 mg via ORAL
  Filled 2019-10-18: qty 1

## 2019-10-18 MED ORDER — DEXAMETHASONE SODIUM PHOSPHATE 10 MG/ML IJ SOLN
INTRAMUSCULAR | Status: AC
  Filled 2019-10-18: qty 1

## 2019-10-18 MED ORDER — OXYCODONE HCL 5 MG/5ML PO SOLN: 5.0000 mg | Freq: Once | ORAL | Status: DC | PRN

## 2019-10-18 MED ORDER — ALBUMIN HUMAN 5 % IV SOLN
INTRAVENOUS | Status: DC | PRN
  Administered 2019-10-18 (×2): via INTRAVENOUS

## 2019-10-18 MED ORDER — KETAMINE HCL 10 MG/ML IJ SOLN
INTRAMUSCULAR | Status: AC
  Filled 2019-10-18: qty 1

## 2019-10-18 MED ORDER — ONDANSETRON HCL 4 MG PO TABS: 4.0000 mg | ORAL_TABLET | Freq: Four times a day (QID) | ORAL | Status: DC | PRN

## 2019-10-18 MED ORDER — ALBUMIN HUMAN 5 % IV SOLN
INTRAVENOUS | Status: AC
  Filled 2019-10-18: qty 250

## 2019-10-18 MED ORDER — 0.9 % SODIUM CHLORIDE (POUR BTL) OPTIME
TOPICAL | Status: DC | PRN
  Administered 2019-10-18: 2000 mL

## 2019-10-18 MED ORDER — STERILE WATER FOR INJECTION IJ SOLN
INTRAMUSCULAR | Status: DC | PRN
  Administered 2019-10-18: 10 mL via URETERAL

## 2019-10-18 MED ORDER — LIDOCAINE 2% (20 MG/ML) 5 ML SYRINGE
INTRAMUSCULAR | Status: AC
  Filled 2019-10-18: qty 5

## 2019-10-18 MED ORDER — ONDANSETRON HCL 4 MG/2ML IJ SOLN
INTRAMUSCULAR | Status: DC | PRN
  Administered 2019-10-18: 4 mg via INTRAVENOUS

## 2019-10-18 MED ORDER — DEXAMETHASONE SODIUM PHOSPHATE 4 MG/ML IJ SOLN
INTRAMUSCULAR | Status: DC | PRN
  Administered 2019-10-18: 8 mg via INTRAVENOUS

## 2019-10-18 MED ORDER — SODIUM CHLORIDE 0.9 % IV SOLN
2.0000 g | Freq: Two times a day (BID) | INTRAVENOUS | Status: AC
  Administered 2019-10-18: 2 g via INTRAVENOUS
  Filled 2019-10-18: qty 2

## 2019-10-18 MED ORDER — HEPARIN SODIUM (PORCINE) 5000 UNIT/ML IJ SOLN
5000.0000 [IU] | Freq: Once | INTRAMUSCULAR | Status: AC
  Administered 2019-10-18: 5000 [IU] via SUBCUTANEOUS
  Filled 2019-10-18: qty 1

## 2019-10-18 MED ORDER — SUGAMMADEX SODIUM 500 MG/5ML IV SOLN
INTRAVENOUS | Status: AC
  Filled 2019-10-18: qty 5

## 2019-10-18 MED ORDER — LACTATED RINGERS IV SOLN
INTRAVENOUS | Status: DC
  Administered 2019-10-18 (×4): via INTRAVENOUS

## 2019-10-18 SURGICAL SUPPLY — 138 items
ADAPTER GOLDBERG URETERAL (ADAPTER) IMPLANT
BAG URO CATCHER STRL LF (MISCELLANEOUS) ×5 IMPLANT
BLADE EXTENDED COATED 6.5IN (ELECTRODE) IMPLANT
CANNULA REDUC XI 12-8 STAPL (CANNULA) ×1
CANNULA REDUC XI 12-8MM STAPL (CANNULA) ×1
CANNULA REDUCER 12-8 DVNC XI (CANNULA) ×3 IMPLANT
CATH INTERMIT  6FR 70CM (CATHETERS) ×5 IMPLANT
CATH ROBINSON RED A/P 16FR (CATHETERS) ×2 IMPLANT
CELLS DAT CNTRL 66122 CELL SVR (MISCELLANEOUS) IMPLANT
CLIP VESOLOCK LG 6/CT PURPLE (CLIP) IMPLANT
CLIP VESOLOCK MED 6/CT (CLIP) IMPLANT
CLOTH BEACON ORANGE TIMEOUT ST (SAFETY) ×5 IMPLANT
COUNTER NEEDLE 20 DBL MAG RED (NEEDLE) ×5 IMPLANT
COVER MAYO STAND STRL (DRAPES) ×15 IMPLANT
COVER SURGICAL LIGHT HANDLE (MISCELLANEOUS) ×10 IMPLANT
COVER TIP SHEARS 8 DVNC (MISCELLANEOUS) ×3 IMPLANT
COVER TIP SHEARS 8MM DA VINCI (MISCELLANEOUS) ×2
COVER WAND RF STERILE (DRAPES) ×5 IMPLANT
DECANTER SPIKE VIAL GLASS SM (MISCELLANEOUS) ×5 IMPLANT
DERMABOND ADVANCED (GAUZE/BANDAGES/DRESSINGS) ×2
DERMABOND ADVANCED .7 DNX12 (GAUZE/BANDAGES/DRESSINGS) ×3 IMPLANT
DEVICE SECURE STRAP 25 ABSORB (INSTRUMENTS) ×2 IMPLANT
DEVICE TROCAR PUNCTURE CLOSURE (ENDOMECHANICALS) ×2 IMPLANT
DRAIN CHANNEL 19F RND (DRAIN) ×2 IMPLANT
DRAPE ARM DVNC X/XI (DISPOSABLE) ×12 IMPLANT
DRAPE COLUMN DVNC XI (DISPOSABLE) ×3 IMPLANT
DRAPE DA VINCI XI ARM (DISPOSABLE) ×8
DRAPE DA VINCI XI COLUMN (DISPOSABLE) ×2
DRAPE LAPAROSCOPIC ABDOMINAL (DRAPES) ×5 IMPLANT
DRAPE SURG IRRIG POUCH 19X23 (DRAPES) ×5 IMPLANT
DRSG OPSITE POSTOP 4X10 (GAUZE/BANDAGES/DRESSINGS) IMPLANT
DRSG OPSITE POSTOP 4X6 (GAUZE/BANDAGES/DRESSINGS) IMPLANT
DRSG OPSITE POSTOP 4X8 (GAUZE/BANDAGES/DRESSINGS) IMPLANT
ELECT PENCIL ROCKER SW 15FT (MISCELLANEOUS) ×10 IMPLANT
ELECT REM PT RETURN 15FT ADLT (MISCELLANEOUS) ×5 IMPLANT
ENDOLOOP SUT PDS II  0 18 (SUTURE)
ENDOLOOP SUT PDS II 0 18 (SUTURE) IMPLANT
EVACUATOR SILICONE 100CC (DRAIN) ×2 IMPLANT
GAUZE SPONGE 4X4 12PLY STRL (GAUZE/BANDAGES/DRESSINGS) IMPLANT
GLOVE BIO SURGEON STRL SZ 6.5 (GLOVE) ×12 IMPLANT
GLOVE BIO SURGEON STRL SZ7 (GLOVE) ×4 IMPLANT
GLOVE BIO SURGEONS STRL SZ 6.5 (GLOVE) ×3
GLOVE BIOGEL M 8.0 STRL (GLOVE) ×5 IMPLANT
GLOVE BIOGEL PI IND STRL 7.0 (GLOVE) ×9 IMPLANT
GLOVE BIOGEL PI IND STRL 7.5 (GLOVE) IMPLANT
GLOVE BIOGEL PI INDICATOR 7.0 (GLOVE) ×14
GLOVE BIOGEL PI INDICATOR 7.5 (GLOVE) ×4
GLOVE ECLIPSE 8.0 STRL XLNG CF (GLOVE) ×4 IMPLANT
GLOVE INDICATOR 8.0 STRL GRN (GLOVE) ×4 IMPLANT
GOWN STRL REUS W/ TWL LRG LVL3 (GOWN DISPOSABLE) IMPLANT
GOWN STRL REUS W/TWL LRG LVL3 (GOWN DISPOSABLE) ×4
GOWN STRL REUS W/TWL XL LVL3 (GOWN DISPOSABLE) ×28 IMPLANT
GRASPER ENDOPATH ANVIL 10MM (MISCELLANEOUS) IMPLANT
GRASPER SUT TROCAR 14GX15 (MISCELLANEOUS) IMPLANT
GUIDEWIRE STR DUAL SENSOR (WIRE) ×5 IMPLANT
HOLDER FOLEY CATH W/STRAP (MISCELLANEOUS) ×5 IMPLANT
IRRIG SUCT STRYKERFLOW 2 WTIP (MISCELLANEOUS) ×5
IRRIGATION SUCT STRKRFLW 2 WTP (MISCELLANEOUS) ×3 IMPLANT
IRRIGATOR SUCT 8 DISP DVNC XI (IRRIGATION / IRRIGATOR) IMPLANT
IRRIGATOR SUCTION 8MM XI DISP (IRRIGATION / IRRIGATOR)
KIT PROCEDURE DA VINCI SI (MISCELLANEOUS) ×2
KIT PROCEDURE DVNC SI (MISCELLANEOUS) IMPLANT
KIT SIGMOIDOSCOPE (SET/KITS/TRAYS/PACK) IMPLANT
KIT TURNOVER KIT A (KITS) IMPLANT
LEGGING LITHOTOMY PAIR STRL (DRAPES) IMPLANT
MANIFOLD NEPTUNE II (INSTRUMENTS) ×5 IMPLANT
MESH PHASIX ST 15X20 (Mesh General) ×2 IMPLANT
NDL INSUFFLATION 14GA 120MM (NEEDLE) ×3 IMPLANT
NEEDLE INSUFFLATION 14GA 120MM (NEEDLE) ×5 IMPLANT
PACK CARDIOVASCULAR III (CUSTOM PROCEDURE TRAY) ×5 IMPLANT
PACK COLON (CUSTOM PROCEDURE TRAY) ×5 IMPLANT
PACK CYSTO (CUSTOM PROCEDURE TRAY) ×5 IMPLANT
PAD POSITIONING PINK XL (MISCELLANEOUS) ×5 IMPLANT
PORT LAP GEL ALEXIS MED 5-9CM (MISCELLANEOUS) ×5 IMPLANT
POUCH OSTOMY FLEX CONVEX 2-1/8 (OSTOMY) ×2 IMPLANT
PROTECTOR NERVE ULNAR (MISCELLANEOUS) ×5 IMPLANT
RELOAD STAPLE 45 BLU REG DVNC (STAPLE) IMPLANT
RELOAD STAPLE 45 GRN THCK DVNC (STAPLE) IMPLANT
RELOAD STAPLE 60 4.3 GRN DVNC (STAPLE) IMPLANT
RELOAD STAPLER 4.3X60 GRN DVNC (STAPLE) ×3 IMPLANT
RETRACTOR WND ALEXIS 18 MED (MISCELLANEOUS) IMPLANT
RTRCTR WOUND ALEXIS 18CM MED (MISCELLANEOUS)
SCISSORS LAP 5X35 DISP (ENDOMECHANICALS) ×5 IMPLANT
SEAL CANN UNIV 5-8 DVNC XI (MISCELLANEOUS) ×9 IMPLANT
SEAL XI 5MM-8MM UNIVERSAL (MISCELLANEOUS) ×8
SEALER TISSUE G2 STRG ARTC 35C (ENDOMECHANICALS) IMPLANT
SEALER VESSEL DA VINCI XI (MISCELLANEOUS) ×2
SEALER VESSEL EXT DVNC XI (MISCELLANEOUS) ×3 IMPLANT
SLEEVE ADV FIXATION 5X100MM (TROCAR) IMPLANT
SOLUTION ELECTROLUBE (MISCELLANEOUS) ×5 IMPLANT
SPONGE DRAIN TRACH 4X4 STRL 2S (GAUZE/BANDAGES/DRESSINGS) ×2 IMPLANT
STAPLER 45 BLU RELOAD XI (STAPLE) IMPLANT
STAPLER 45 BLUE RELOAD XI (STAPLE)
STAPLER 45 GREEN RELOAD XI (STAPLE)
STAPLER 45 GRN RELOAD XI (STAPLE) IMPLANT
STAPLER 60 DA VINCI SURE FORM (STAPLE) ×2
STAPLER 60 SUREFORM DVNC (STAPLE) IMPLANT
STAPLER CANNULA SEAL DVNC XI (STAPLE) ×3 IMPLANT
STAPLER CANNULA SEAL XI (STAPLE) ×2
STAPLER PROXIMATE 75MM BLUE (STAPLE) ×2 IMPLANT
STAPLER RELOAD 4.3X60 GREEN (STAPLE) ×2
STAPLER RELOAD 4.3X60 GRN DVNC (STAPLE) ×3
STAPLER SHEATH (SHEATH)
STAPLER SHEATH ENDOWRIST DVNC (SHEATH) ×3 IMPLANT
STAPLER VISISTAT 35W (STAPLE) ×3 IMPLANT
STOPCOCK 4 WAY LG BORE MALE ST (IV SETS) ×4 IMPLANT
SURGILUBE 2OZ TUBE FLIPTOP (MISCELLANEOUS) ×5 IMPLANT
SUT ETHILON 2 0 PS N (SUTURE) ×2 IMPLANT
SUT NOVA NAB DX-16 0-1 5-0 T12 (SUTURE) ×14 IMPLANT
SUT NOVA NAB GS-21 0 18 T12 DT (SUTURE) ×6 IMPLANT
SUT PDS AB 1 CTX 36 (SUTURE) IMPLANT
SUT PDS AB 1 TP1 96 (SUTURE) IMPLANT
SUT PROLENE 2 0 KS (SUTURE) ×5 IMPLANT
SUT SILK 2 0 (SUTURE) ×2
SUT SILK 2 0 SH CR/8 (SUTURE) ×5 IMPLANT
SUT SILK 2-0 18XBRD TIE 12 (SUTURE) ×3 IMPLANT
SUT SILK 3 0 (SUTURE) ×2
SUT SILK 3 0 SH CR/8 (SUTURE) ×5 IMPLANT
SUT SILK 3-0 18XBRD TIE 12 (SUTURE) ×3 IMPLANT
SUT V-LOC BARB 180 2/0GR6 GS22 (SUTURE) ×15
SUT VIC AB 2-0 SH 18 (SUTURE) ×7 IMPLANT
SUT VIC AB 2-0 SH 27 (SUTURE) ×2
SUT VIC AB 2-0 SH 27X BRD (SUTURE) ×3 IMPLANT
SUT VIC AB 3-0 SH 18 (SUTURE) IMPLANT
SUT VIC AB 4-0 PS2 27 (SUTURE) ×10 IMPLANT
SUT VICRYL 0 UR6 27IN ABS (SUTURE) ×5 IMPLANT
SUTURE V-LC BRB 180 2/0GR6GS22 (SUTURE) IMPLANT
SYR 10ML ECCENTRIC (SYRINGE) ×5 IMPLANT
SYS LAPSCP GELPORT 120MM (MISCELLANEOUS)
SYSTEM LAPSCP GELPORT 120MM (MISCELLANEOUS) IMPLANT
TAPE STRIPS DRAPE STRL (GAUZE/BANDAGES/DRESSINGS) IMPLANT
TOWEL OR 17X26 10 PK STRL BLUE (TOWEL DISPOSABLE) IMPLANT
TOWEL OR NON WOVEN STRL DISP B (DISPOSABLE) ×5 IMPLANT
TRAY FOLEY MTR SLVR 16FR STAT (SET/KITS/TRAYS/PACK) ×5 IMPLANT
TROCAR ADV FIXATION 5X100MM (TROCAR) ×5 IMPLANT
TUBING CONNECTING 10 (TUBING) ×11 IMPLANT
TUBING CONNECTING 10' (TUBING) ×2
TUBING INSUFFLATION 10FT LAP (TUBING) ×5 IMPLANT

## 2019-10-18 NOTE — Interval H&P Note (Signed)
History and Physical Interval Note:  10/18/2019 7:06 AM  Tracker Shawn Chambers  has presented today for surgery, with the diagnosis of RECTAL CANCER, OSTOMY PROLAPSE.  The various methods of treatment have been discussed with the patient and family. After consideration of risks, benefits and other options for treatment, the patient has consented to  Procedure(s): XI ROBOTIC ASSISTED LOWER ANTERIOR RESECTION (N/A) OSTOMY REVISION (N/A) CYSTOSCOPY WITH STENT PLACEMENT  , firefly stent (Bilateral) as a surgical intervention.  The patient's history has been reviewed, patient examined, no change in status, stable for surgery.  I have reviewed the patient's chart and labs.  Questions were answered to the patient's satisfaction.     Rosario Adie, MD  [facility]

## 2019-10-18 NOTE — Op Note (Signed)
Preoperative diagnosis: rectal cancer  Postoperative diagnosis: Same  Procedure: 1 cystoscopy 2. Bilateral retrograde pyelography and instillation of firefly dye 3.  Intraoperative fluoroscopy, under one hour, with interpretation  Attending: Rosie Fate  Anesthesia: General  Estimated blood loss: None  Drains: 16 french foley  Specimens: none   Findings: . No masses/lesions in the bladder. No right hydronephrosis. 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  Ureteral orifices in normal anatomic location.  Indications: Patient is a 78 year old male with a history of rectal cancer and urology was consulted for firefly installation for identification of the ureters during LAR. After discussing treatment options, they decided proceed with cystoscopy with bilateral retrogrades.  Procedure her in detail: The patient was brought to the operating room and a brief timeout was done to ensure correct patient, correct procedure, correct site.  General anesthesia was administered patient was placed in dorsal lithotomy position.  Her genitalia was then prepped and draped in usual sterile fashion.  A rigid 27 French cystoscope was passed in the urethra and the bladder.  Bladder was inspected free masses or lesions.  the ureteral orifices were in the normal orthotopic locations. a 6 french ureteral catheter was then instilled into the left ureteral orifice.  a gentle retrograde was obtained and findings noted above. 3cc of firefly was then instilled into the left ureter.  We then turned out attention to the right side.  a gentle retrograde was obtained and findings noted above. We then instilled 5cc of firefly into the right ureteral orifice. A 16 french foley was then placed.  the bladder was then drained and this concluded the procedure which was well tolerated by patient.  Complications: None  Condition: Stable

## 2019-10-18 NOTE — Transfer of Care (Signed)
Immediate Anesthesia Transfer of Care Note  Patient: Shawn Chambers  Procedure(s) Performed: Procedure(s): XI ROBOTIC ASSISTED LOWER ANTERIOR RESECTION WITH PERISTOMAL HERNIA REPAIR WITH MESH ( SUGAR BAKER) (N/A) OSTOMY REVISION (N/A) CYSTOSCOPY WITH FIREFLY INJECTION (Bilateral)  Patient Location: PACU  Anesthesia Type:General  Level of Consciousness: Patient easily awoken, sedated, comfortable, cooperative, following commands, responds to stimulation.   Airway & Oxygen Therapy: Patient spontaneously breathing, ventilating well, oxygen via simple oxygen mask.  Post-op Assessment: Report given to PACU RN, vital signs reviewed and stable, moving all extremities.   Post vital signs: Reviewed and stable.  Complications: No apparent anesthesia complications Last Vitals:  Vitals Value Taken Time  BP 146/88 10/18/19 1351  Temp    Pulse 101 10/18/19 1356  Resp 13 10/18/19 1356  SpO2 98 % 10/18/19 1356  Vitals shown include unvalidated device data.  Last Pain:  Vitals:   10/18/19 0628  TempSrc:   PainSc: 0-No pain         Complications: No apparent anesthesia complications

## 2019-10-18 NOTE — Anesthesia Procedure Notes (Signed)
Procedure Name: Intubation Date/Time: 10/18/2019 7:52 AM Performed by: Deliah Boston, CRNA Pre-anesthesia Checklist: Patient identified, Emergency Drugs available, Suction available and Patient being monitored Patient Re-evaluated:Patient Re-evaluated prior to induction Oxygen Delivery Method: Circle system utilized Preoxygenation: Pre-oxygenation with 100% oxygen Induction Type: IV induction Ventilation: Mask ventilation without difficulty Laryngoscope Size: Mac and 4 Grade View: Grade I Tube type: Oral Tube size: 7.5 mm Number of attempts: 1 Airway Equipment and Method: Stylet and Oral airway Placement Confirmation: ETT inserted through vocal cords under direct vision,  positive ETCO2 and breath sounds checked- equal and bilateral Secured at: 22 cm Tube secured with: Tape Dental Injury: Teeth and Oropharynx as per pre-operative assessment

## 2019-10-18 NOTE — Anesthesia Preprocedure Evaluation (Signed)
Anesthesia Evaluation  Patient identified by MRN, date of birth, ID band Patient awake    Reviewed: Allergy & Precautions, H&P , NPO status , Patient's Chart, lab work & pertinent test results  Airway Mallampati: II   Neck ROM: full    Dental   Pulmonary former smoker,    breath sounds clear to auscultation       Cardiovascular negative cardio ROS   Rhythm:regular Rate:Normal     Neuro/Psych Seizures -,     GI/Hepatic GERD  ,Rectal CA   Endo/Other    Renal/GU H/o renal CA s/p partial nephrectomy     Musculoskeletal  (+) Arthritis ,   Abdominal   Peds  Hematology   Anesthesia Other Findings   Reproductive/Obstetrics                             Anesthesia Physical Anesthesia Plan  ASA: III  Anesthesia Plan: General   Post-op Pain Management:    Induction: Intravenous  PONV Risk Score and Plan: 2 and Ondansetron, Dexamethasone and Treatment may vary due to age or medical condition  Airway Management Planned: Oral ETT  Additional Equipment:   Intra-op Plan:   Post-operative Plan: Extubation in OR  Informed Consent: I have reviewed the patients History and Physical, chart, labs and discussed the procedure including the risks, benefits and alternatives for the proposed anesthesia with the patient or authorized representative who has indicated his/her understanding and acceptance.       Plan Discussed with: CRNA, Anesthesiologist and Surgeon  Anesthesia Plan Comments:         Anesthesia Quick Evaluation

## 2019-10-18 NOTE — Op Note (Signed)
10/18/2019  1:34 PM  PATIENT:  Shawn Chambers  78 y.o. male  Patient Care Team: Pa, [place] as PCP - General White, Sharon Mt, MD as Consulting Physician (General Surgery)  PRE-OPERATIVE DIAGNOSIS:  RECTAL CANCER, OSTOMY PROLAPSE   POST-OPERATIVE DIAGNOSIS:  RECTAL CANCER, OSTOMY PROLAPSE, PARASTOMAL HERNIA  PROCEDURE: XI ROBOTIC ASSISTED LOWER ANTERIOR RESECTION, PARASTOMAL HERNIA REPAIR WITH MESH ( SUGAR-BAKER), OSTOMY REVISION CYSTOSCOPY WITH FIREFLY INJECTION    Surgeon(s): Leighton Ruff, MD Michael Boston, MD McKenzie, Candee Furbish, MD  ASSISTANT: Dr Johney Maine   ANESTHESIA:   local and general  EBL: 262ml  Total I/O In: 68 [I.V.:3000; IV Piggyback:500] Out: [id number] [Urine:225; Blood:250]  Delay start of Pharmacological VTE agent (>24hrs) due to surgical blood loss or risk of bleeding:  no  DRAINS: (46F) Jackson-Pratt drain(s) with closed bulb suction in the pelvis   SPECIMEN:  Source of Specimen:  Descending colon and rectum, colostomy  DISPOSITION OF SPECIMEN:  PATHOLOGY  COUNTS:  YES  PLAN OF CARE: Admit to inpatient   PATIENT DISPOSITION:  PACU - hemodynamically stable.  INDICATION:    78 year old male who was diagnosed with rectal cancer in the beginning of the year.  He underwent neoadjuvant chemotherapy and radiation.  He initially declined surgery.  After much discussion, he decided to proceed with resection.  I recommended low anterior resection.  He is also having difficulty with ostomy prolapse.  I recommended ostomy revision and hernia repair.  Given that he is a smoker and has waited so long after radiation, I did not feel he was safe to perform an anastomosis.  The anatomy & physiology of the digestive tract was discussed.  The pathophysiology was discussed.  Natural history risks without surgery was discussed.   I worked to give an overview of the disease and the frequent need to have multispecialty involvement.  I feel the risks of no  intervention will lead to serious problems that outweigh the operative risks; therefore, I recommended a partial colectomy to remove the pathology.  Laparoscopic & open techniques were discussed.   Risks such as bleeding, infection, abscess, leak, reoperation, possible ostomy, hernia, heart attack, death, and other risks were discussed.  I noted a good likelihood this will help address the problem.   Goals of post-operative recovery were discussed as well.    The patient expressed understanding & wished to proceed with surgery.  OR FINDINGS:   Patient had proximal rectal mass with significant radiation changes  No obvious metastatic disease on visceral parietal peritoneum or liver.   DESCRIPTION:   Informed consent was confirmed.  The patient underwent general anaesthesia without difficulty.  The patient was positioned appropriately.  VTE prevention in place.  The patient's abdomen was clipped, prepped, & draped in a sterile fashion.  Surgical timeout confirmed our plan.  The patient was positioned in reverse Trendelenburg.  Abdominal entry was gained using his needle in the left upper quadrant.  Entry was clean.  I induced carbon dioxide insufflation.  An 19mm robotic port was placed in the right lateral sidewall.  Camera inspection revealed no injury.  Extra ports were carefully placed under direct laparoscopic visualization.  There are significant adhesions to the midline incision.  Some of these were taken down bluntly to allow for port to be placed.  The patient was appropriately positioned and the robot was docked to the patient's left side.  Instruments were placed under direct visualization.   I began by taking down the omental adhesions to the midline  scar.  This was then placed into the upper abdomen.  Identified the sigmoid colon.  I mobilized the sigmoid colon off of the pelvic sidewall.  I scored the base of peritoneum of the right side of the mesentery of the left colon from the  ligament of Treitz to the peritoneal reflection of the mid rectum.  The patient had significant anterior adhesions.  I elevated the sigmoid mesentery and enetered into the retro-mesenteric plane. We were able to identify the left ureter and gonadal vessels. We kept those posterior within the retroperitoneum and elevated the left colon mesentery off that. I did isolated IMA pedicle but did not ligate it yet.  I continued distally and got into the avascular plane posterior to the mesorectum. This allowed me to help mobilize the rectum as well by freeing the mesorectum off the sacrum.  I mobilized the peritoneal coverings towards the peritoneal reflection on both the right and left sides of the rectum.  I could see the right and left ureters and stayed away from them.    I skeletonized the inferior mesenteric artery pedicle.  I went down to its takeoff from the aorta.  After confirming the left ureter was out of the way, I went ahead and ligated the inferior mesenteric artery pedicle with bipolar robotic vessel sealer ~2cm above its takeoff from the aorta.  We ensured hemostasis.  I then continued my dissection through the retroperitoneum using blunt dissection and the vessel sealer.  The entire descending colon was freed from the retroperitoneal inflammatory attachments from previous surgery.  Once this was complete, I turned my attention to the patient's pelvis.  I entered into the presacral plane posteriorly.  I dissected down to the level of the coccyx using the robotic vessel sealer and blunt dissection.  I continued out laterally around the mesorectal plane until I was down below the level of the tumor.  I continued my dissection anteriorly.  I opened up the entire peritoneal reflection and carefully dissected the colon away from the anterior structures.  Once that was down below the tumor I divided the remaining rectal mesentery posteriorly.  This was also done using the vessel sealer and electrocautery.   There was a significant amount of scar tissue noted within the distal rectum.  I was unable to get a stapler device across this safely.  I decided to divide it with electrocautery.  I then closed the rectal stump using a running 2 OV lock suture.  A 19 [place] Blake drain was placed into the pelvis.  This was brought out through the right lower quadrant port site.  It was secured into place with a 2-0 nylon suture.  I then turned my attention to the patient's parastomal hernia.  Given that the patient is having colostomy prolapse, I decided to tunnel the colostomy of the peritoneum.  I divided the peritoneum at the colostomy site using electrocautery robotically.  I then continued my dissection out laterally obtaining hemostasis as I went.  I then made a small incision in the peritoneum laterally to complete my tunnel.  At this point the robot was undocked.  The colostomy site was taken down using electrocautery.  The end of the ostomy was stapled using a GIA blue load 75 mm stapler.  We laparoscopically passed the colon through the tunnel and brought it out through the ostomy site.  I then closed her parastomal fascia using interrupted #1 Novafil sutures.  I closed the stoma site down to approximately 1 fingerbreadth.  I then placed a 15 x 20 cm phasix mesh into the abdominal cavity after securing six #1 Novafil sutures around the edge.  We then reinsufflated the abdomen and redocked the robot.  The peritoneum was then closed over the ostomy site using a running 2 oh VueLock suture.  The phasic's mesh was then placed over the peritoneum to cover the ostomy site closure as well.  The Novafil sutures were pulled through the abdominal wall using a suture passer.  I then used a secure strap tacker to tack the mesh completely around the edges making sure to avoid the colon in the middle.  This completed my Sugarbaker parastomal hernia repair.  The abdomen was then irrigated with 2 L of warm normal saline.  Hemostasis  was good.  The abdomen was then desufflated and the ports were removed.  The port sites were closed using 4-0 Vicryl suture.  Dermabond was used to close the port sites and suture passer sites.  Once this was complete, the ostomy was matured in standard [place] fashion using interrupted 2-0 Vicryl sutures.  The ostomy was patent to 1 fingerbreadth.  An ostomy appliance was placed.  Appropriate dressings were also placed and the patient was awakened from anesthesia and sent to the postanesthesia care unit in stable condition.  All counts were correct per operating room staff.  An MD assistant was necessary for tissue manipulation, retraction and positioning due to the complexity of the case and hospital policies.

## 2019-10-19 ENCOUNTER — Encounter (HOSPITAL_COMMUNITY): Payer: Self-pay | Admitting: General Surgery

## 2019-10-19 LAB — BASIC METABOLIC PANEL
Anion gap: 9 (ref 5–15)
BUN: 17 mg/dL (ref 8–23)
CO2: 25 mmol/L (ref 22–32)
Calcium: 8.8 mg/dL — ABNORMAL LOW (ref 8.9–10.3)
Chloride: 103 mmol/L (ref 98–111)
Creatinine, Ser: 1.31 mg/dL — ABNORMAL HIGH (ref 0.61–1.24)
GFR calc Af Amer: 60 mL/min — ABNORMAL LOW (ref >60)
GFR calc non Af Amer: 51 mL/min — ABNORMAL LOW (ref >60)
Glucose, Bld: 145 mg/dL — ABNORMAL HIGH (ref 70–99)
Potassium: 4.3 mmol/L (ref 3.5–5.1)
Sodium: 137 mmol/L (ref 135–145)

## 2019-10-19 LAB — CBC
HCT: 31.9 % — ABNORMAL LOW (ref 39.0–52.0)
Hemoglobin: 10.4 g/dL — ABNORMAL LOW (ref 13.0–17.0)
MCH: 30.4 pg (ref 26.0–34.0)
MCHC: 32.6 g/dL (ref 30.0–36.0)
MCV: 93.3 fL (ref 80.0–100.0)
Platelets: 116 10*3/uL — ABNORMAL LOW (ref 150–400)
RBC: 3.42 MIL/uL — ABNORMAL LOW (ref 4.22–5.81)
RDW: 12.9 % (ref 11.5–15.5)
WBC: 6.8 10*3/uL (ref 4.0–10.5)
nRBC: 0 % (ref 0.0–0.2)

## 2019-10-19 MED ORDER — CHLORHEXIDINE GLUCONATE CLOTH 2 % EX PADS
6.0000 | MEDICATED_PAD | Freq: Every day | CUTANEOUS | Status: DC
  Administered 2019-10-19 – 2019-10-23 (×4): 6 via TOPICAL

## 2019-10-19 NOTE — Progress Notes (Signed)
1 Day Post-Op Robotic LAR, colostomy revision, parastomal hernia repair Subjective: Doing well, feels sore.  Ambulated in the hall  Objective: Vital signs in last 24 hours: Temp:  [97.6 F (36.4 C)-100.2 F (37.9 C)] 98.7 F (37.1 C) (11/06 0506) Pulse Rate:  [73-101] 73 (11/06 0506) Resp:  [13-20] 18 (11/06 0506) BP: (118-146)/(58-88) 118/62 (11/06 0506) SpO2:  [93 %-100 %] 99 % (11/06 0506) Weight:  [84.1 kg] 84.1 kg (11/06 0224)   Intake/Output from previous day: 11/05 0701 - 11/06 0700 In: 4878.8 [I.V.:4278.8; IV Piggyback:600] Out: 2620 [Urine:1975; Drains:370; Stool:25; Blood:250] Intake/Output this shift: No intake/output data recorded.   General appearance: alert and cooperative GI: soft, distended Ostomy: edematous Incision: no significant drainage  Lab Results:  Recent Labs    10/19/19 0306  WBC 6.8  HGB 10.4*  HCT 31.9*  PLT 116*   BMET Recent Labs    10/19/19 0306  NA 137  K 4.3  CL 103  CO2 25  GLUCOSE 145*  BUN 17  CREATININE 1.31*  CALCIUM 8.8*   PT/INR No results for input(s): LABPROT, INR in the last 72 hours. ABG No results for input(s): PHART, HCO3 in the last 72 hours.  Invalid input(s): PCO2, PO2  MEDS, Scheduled . acetaminophen  1,000 mg Oral Q6H  . alvimopan  12 mg Oral BID  . enoxaparin (LOVENOX) injection  40 mg Subcutaneous Q24H  . feeding supplement  237 mL Oral BID BM  . gabapentin  300 mg Oral BID  . latanoprost  1 drop Both Eyes QHS  . saccharomyces boulardii  250 mg Oral BID  . tamsulosin  0.8 mg Oral QHS    Studies/Results: Dg C-arm 1-60 Min-no Report  Result Date: 10/18/2019 Fluoroscopy was utilized by the requesting physician.  No radiographic interpretation.    Assessment: s/p Procedure(s): XI ROBOTIC ASSISTED LOWER ANTERIOR RESECTION WITH PERISTOMAL HERNIA REPAIR WITH MESH ( SUGAR BAKER) OSTOMY REVISION CYSTOSCOPY WITH FIREFLY INJECTION Patient Active Problem List   Diagnosis Date Noted  .  Parastomal hernia without obstruction or gangrene 10/18/2019  . Rectal cancer ([place]) 10/18/2019  . Rectal carcinoma ([place]) 02/19/2019  . 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 GERD (gastroesophageal reflux disease) 03/28/2017  . 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 S/P partial colectomy 03/28/2017    Expected post op course  Plan: Advance diet to fulls D/c IVF's Cont to ambulate PT/OT   LOS: 1 day     .Rosario Adie, MD [facility], [place]    10/19/2019 7:22 AM

## 2019-10-19 NOTE — Evaluation (Signed)
Physical Therapy Evaluation Patient Details Name: Shawn Chambers MRN: [id number] DOB: 1941/06/24 Today's Date: 10/19/2019   History of Present Illness  1 Day Post-Op Robotic LAR, colostomy revision, parastomal hernia repair secondary to rectal CA  Clinical Impression  Pt presents with dependencies in mobility secondary to the above diagnosis. Pt is currently limited by abdominal discomfort due to surgery. Pt reports he lives alone , but has family who can assist after d/c home. Recommend d/c home with family support and HHPT follow-up. Pt will need a RW for home use. Pt will continue to benefit from continued acute skilled PT to maximize mobility and Independence for d/c home with family.     Follow Up Recommendations Home health PT;Supervision for mobility/OOB    Equipment Recommendations  Rolling walker with 5" wheels    Recommendations for Other Services       Precautions / Restrictions Precautions Precautions: Other (comment) Precaution Comments: drain Restrictions Weight Bearing Restrictions: No      Mobility  Bed Mobility Overal bed mobility: Needs Assistance Bed Mobility: Supine to Sit;Sit to Supine     Supine to sit: Min guard;HOB elevated Sit to supine: Min assist;HOB elevated   General bed mobility comments: min assist for lifting legs back into bed due to abdominal discomfort, use of bed rails  Transfers Overall transfer level: Needs assistance Equipment used: Rolling walker (2 wheeled) Transfers: Sit to/from Stand Sit to Stand: Min guard         General transfer comment: cues for hand placement  Ambulation/Gait Ambulation/Gait assistance: Min guard Gait Distance (Feet): 100 Feet Assistive device: Rolling walker (2 wheeled) Gait Pattern/deviations: Step-through pattern;Decreased stride length Gait velocity: decreased   General Gait Details: pt reported R side pain with gait and requested to return to room  Stairs            Wheelchair  Mobility    Modified Rankin (Stroke Patients Only)       Balance Overall balance assessment: No apparent balance deficits (not formally assessed)                                           Pertinent Vitals/Pain Pain Assessment: 0-10 Pain Score: 8  Pain Location: abdomen Pain Descriptors / Indicators: Discomfort Pain Intervention(s): Limited activity within patient's tolerance;Repositioned;Monitored during session    Home Living Family/patient expects to be discharged to:: Private residence Living Arrangements: Alone Available Help at Discharge: Family;Available PRN/intermittently Type of Home: House Home Access: Ramped entrance     Home Layout: One level Home Equipment: None      Prior Function Level of Independence: Independent               Hand Dominance        Extremity/Trunk Assessment   Upper Extremity Assessment Upper Extremity Assessment: Defer to OT evaluation    Lower Extremity Assessment Lower Extremity Assessment: Overall WFL for tasks assessed       Communication   Communication: HOH  Cognition Arousal/Alertness: Awake/alert Behavior During Therapy: WFL for tasks assessed/performed Overall Cognitive Status: Within Functional Limits for tasks assessed                                        General Comments      Exercises     Assessment/Plan  PT Assessment Patient needs continued PT services  PT Problem List Decreased mobility;Decreased activity tolerance;Decreased safety awareness;Pain;Decreased balance;Decreased strength;Decreased range of motion       PT Treatment Interventions DME instruction;Functional mobility training;Patient/family education;Gait training;Therapeutic activities;Therapeutic exercise;Balance training    PT Goals (Current goals can be found in the Care Plan section)  Acute Rehab PT Goals Patient Stated Goal: To return home PT Goal Formulation: With patient Time For  Goal Achievement: 11/02/19 Potential to Achieve Goals: Good    Frequency Min 3X/week   Barriers to discharge        Co-evaluation               AM-PAC PT "6 Clicks" Mobility  Outcome Measure Help needed turning from your back to your side while in a flat bed without using bedrails?: A Little Help needed moving from lying on your back to sitting on the side of a flat bed without using bedrails?: A Little Help needed moving to and from a bed to a chair (including a wheelchair)?: A Little Help needed standing up from a chair using your arms (e.g., wheelchair or bedside chair)?: A Little Help needed to walk in hospital room?: A Little Help needed climbing 3-5 steps with a railing? : A Little 6 Click Score: 18    End of Session   Activity Tolerance: Patient limited by pain Patient left: in bed;with call bell/phone within reach Nurse Communication: Mobility status PT Visit Diagnosis: Other abnormalities of gait and mobility (R26.89)    Time: 0940-1010 PT Time Calculation (min) (ACUTE ONLY): 30 min   Charges:   PT Evaluation $PT Eval Moderate Complexity: 1 Mod PT Treatments $Gait Training: 8-22 mins $Canalith Rep Proc: 8-22 mins $Physical Performance Test: 8-22 mins $Massage: 8-22 mins       Theodoro Grist, PT  Lelon Mast 10/19/2019, 10:20 AM

## 2019-10-19 NOTE — Evaluation (Signed)
Occupational Therapy Evaluation Patient Details Name: Shawn Chambers MRN: [id number] DOB: 08-Mar-1941 Today's Date: 10/19/2019    History of Present Illness 1 Day Post-Op Robotic LAR, colostomy revision, parastomal hernia repair secondary to rectal CA   Clinical Impression   Pt was admitted for the above sx.  He is limited by pain for adls, but was agreeable to mobilize OOB and take a walk.  Will follow in acute setting with supervision level goals. He needed min A for bed mob, min guard for transfers/walking and max to total A for LB adls due to his pain. Goals are for supervision level    Follow Up Recommendations  Supervision/Assistance - 24 hour;Home health OT    Equipment Recommendations  (with colostomy:  none if no discharge)    Recommendations for Other Services       Precautions / Restrictions Precautions Precautions: Fall Precaution Comments: drain Restrictions Weight Bearing Restrictions: No      Mobility Bed Mobility         Supine to sit: Min assist Sit to supine: Min assist   General bed mobility comments: assist for trunk for OOB and legs for back to bed  Transfers   Equipment used: Rolling walker (2 wheeled)   Sit to Stand: Min guard         General transfer comment: for safety; high bed    Balance                                           ADL either performed or assessed with clinical judgement   ADL Overall ADL's : Needs assistance/impaired                         Toilet Transfer: Min guard;Ambulation;RW(back to bed after walking:  high bed)             General ADL Comments: Pt agreeable to OOB, and he walked 60' in the hall with min A to stand and min guard for safety. He can perform UB adls with set up.  He needs max to total A for LB due to pain.       Vision         Perception     Praxis      Pertinent Vitals/Pain Pain Score: 8  Pain Location: abdomen Pain Descriptors / Indicators:  Discomfort Pain Intervention(s): Limited activity within patient's tolerance;Monitored during session;Premedicated before session;Repositioned     Hand Dominance     Extremity/Trunk Assessment Upper Extremity Assessment Upper Extremity Assessment: Overall WFL for tasks assessed           Communication Communication Communication: HOH   Cognition Arousal/Alertness: Awake/alert Behavior During Therapy: WFL for tasks assessed/performed Overall Cognitive Status: Within Functional Limits for tasks assessed                                     General Comments       Exercises     Shoulder Instructions      Home Living Family/patient expects to be discharged to:: Private residence Living Arrangements: Alone                 Bathroom Shower/Tub: Teacher, early years/pre: Standard     Home Equipment: None  Prior Functioning/Environment Level of Independence: Independent                 OT Problem List: Decreased strength;Decreased activity tolerance;Pain;Decreased knowledge of use of DME or AE;Decreased knowledge of precautions      OT Treatment/Interventions: Self-care/ADL training;DME and/or AE instruction;Energy conservation;Patient/family education;Balance training;Therapeutic activities    OT Goals(Current goals can be found in the care plan section) Acute Rehab OT Goals Patient Stated Goal: To return home OT Goal Formulation: With patient Time For Goal Achievement: 11/02/19 Potential to Achieve Goals: Good ADL Goals Pt Will Perform Grooming: with supervision;standing Pt Will Perform Lower Body Bathing: with supervision;with adaptive equipment;sit to/from stand Pt Will Perform Lower Body Dressing: with supervision;with adaptive equipment;sit to/from stand Additional ADL Goal #1: pt will perform bed mobility at supervision level from flat bed in preparation for adls  OT Frequency: Min 2X/week   Barriers to D/C:             Co-evaluation              AM-PAC OT "6 Clicks" Daily Activity     Outcome Measure Help from another person eating meals?: None Help from another person taking care of personal grooming?: A Little Help from another person toileting, which includes using toliet, bedpan, or urinal?: A Lot Help from another person bathing (including washing, rinsing, drying)?: A Lot Help from another person to put on and taking off regular upper body clothing?: A Little Help from another person to put on and taking off regular lower body clothing?: Total 6 Click Score: 15   End of Session    Activity Tolerance: Patient limited by pain Patient left: in bed;with call bell/phone within reach;with bed alarm set  OT Visit Diagnosis: Muscle weakness (generalized) (M62.81)                Time: [id number] OT Time Calculation (min): 24 min Charges:  OT General Charges $OT Visit: 1 Visit OT Evaluation $OT Eval Low Complexity: 1 Low OT Treatments $Therapeutic Activity: 8-22 mins  Lesle Chris, OTR/L Acute Rehabilitation Services [phone] [facility] pager [phone] office 10/19/2019  [place] 10/19/2019, 3:41 PM

## 2019-10-19 NOTE — Anesthesia Postprocedure Evaluation (Signed)
Anesthesia Post Note  Patient: Radio broadcast assistant  Procedure(s) Performed: XI ROBOTIC ASSISTED LOWER ANTERIOR RESECTION WITH PERISTOMAL HERNIA REPAIR WITH MESH ( SUGAR BAKER) (N/A Abdomen) OSTOMY REVISION (N/A ) CYSTOSCOPY WITH FIREFLY INJECTION (Bilateral )     Patient location during evaluation: PACU Anesthesia Type: General Level of consciousness: awake and alert Pain management: pain level controlled Vital Signs Assessment: post-procedure vital signs reviewed and stable Respiratory status: spontaneous breathing, nonlabored ventilation, respiratory function stable and patient connected to nasal cannula oxygen Cardiovascular status: blood pressure returned to baseline and stable Postop Assessment: no apparent nausea or vomiting Anesthetic complications: no    Last Vitals:  Vitals:   10/19/19 0224 10/19/19 0506  BP: (!) 121/58 118/62  Pulse: 80 73  Resp: 18 18  Temp: 37.9 C 37.1 C  SpO2: 100% 99%    Last Pain:  Vitals:   10/19/19 0506  TempSrc: Oral  PainSc:                  March ARB S

## 2019-10-20 ENCOUNTER — Encounter (HOSPITAL_COMMUNITY): Payer: Self-pay | Admitting: Surgery

## 2019-10-20 DIAGNOSIS — N183 Chronic kidney disease, stage 3 unspecified: Secondary | ICD-10-CM

## 2019-10-20 DIAGNOSIS — D638 Anemia in other chronic diseases classified elsewhere: Secondary | ICD-10-CM

## 2019-10-20 DIAGNOSIS — N400 Enlarged prostate without lower urinary tract symptoms: Secondary | ICD-10-CM | POA: Diagnosis present

## 2019-10-20 DIAGNOSIS — H409 Unspecified glaucoma: Secondary | ICD-10-CM | POA: Diagnosis present

## 2019-10-20 DIAGNOSIS — Z87898 Personal history of other specified conditions: Secondary | ICD-10-CM

## 2019-10-20 DIAGNOSIS — H9190 Unspecified hearing loss, unspecified ear: Secondary | ICD-10-CM

## 2019-10-20 DIAGNOSIS — Z9111 Patient's noncompliance with dietary regimen: Secondary | ICD-10-CM

## 2019-10-20 DIAGNOSIS — Z91199 Patient's noncompliance with other medical treatment and regimen due to unspecified reason: Secondary | ICD-10-CM

## 2019-10-20 LAB — BASIC METABOLIC PANEL
Anion gap: 10 (ref 5–15)
BUN: 15 mg/dL (ref 8–23)
CO2: 25 mmol/L (ref 22–32)
Calcium: 9.6 mg/dL (ref 8.9–10.3)
Chloride: 100 mmol/L (ref 98–111)
Creatinine, Ser: 1.32 mg/dL — ABNORMAL HIGH (ref 0.61–1.24)
GFR calc Af Amer: 59 mL/min — ABNORMAL LOW (ref >60)
GFR calc non Af Amer: 51 mL/min — ABNORMAL LOW (ref >60)
Glucose, Bld: 115 mg/dL — ABNORMAL HIGH (ref 70–99)
Potassium: 4.4 mmol/L (ref 3.5–5.1)
Sodium: 135 mmol/L (ref 135–145)

## 2019-10-20 LAB — CBC
HCT: 36.9 % — ABNORMAL LOW (ref 39.0–52.0)
Hemoglobin: 11.9 g/dL — ABNORMAL LOW (ref 13.0–17.0)
MCH: 30 pg (ref 26.0–34.0)
MCHC: 32.2 g/dL (ref 30.0–36.0)
MCV: 92.9 fL (ref 80.0–100.0)
Platelets: 144 10*3/uL — ABNORMAL LOW (ref 150–400)
RBC: 3.97 MIL/uL — ABNORMAL LOW (ref 4.22–5.81)
RDW: 13.1 % (ref 11.5–15.5)
WBC: 10.6 10*3/uL — ABNORMAL HIGH (ref 4.0–10.5)
nRBC: 0 % (ref 0.0–0.2)

## 2019-10-20 LAB — HEPATIC FUNCTION PANEL
ALT: 15 U/L (ref 0–44)
AST: 19 U/L (ref 15–41)
Albumin: 3.7 g/dL (ref 3.5–5.0)
Alkaline Phosphatase: 45 U/L (ref 38–126)
Bilirubin, Direct: 0.2 mg/dL (ref 0.0–0.2)
Indirect Bilirubin: 1.1 mg/dL — ABNORMAL HIGH (ref 0.3–0.9)
Total Bilirubin: 1.3 mg/dL — ABNORMAL HIGH (ref 0.3–1.2)
Total Protein: 6.7 g/dL (ref 6.5–8.1)

## 2019-10-20 LAB — MAGNESIUM: Magnesium: 1.9 mg/dL (ref 1.7–2.4)

## 2019-10-20 LAB — PREALBUMIN: Prealbumin: 17.6 mg/dL — ABNORMAL LOW (ref 18–38)

## 2019-10-20 LAB — PHOSPHORUS: Phosphorus: 2.5 mg/dL (ref 2.5–4.6)

## 2019-10-20 MED ORDER — LIP MEDEX EX OINT
1.0000 "application " | TOPICAL_OINTMENT | Freq: Two times a day (BID) | CUTANEOUS | Status: DC
  Administered 2019-10-20 – 2019-10-23 (×7): 1 via TOPICAL
  Filled 2019-10-20: qty 7

## 2019-10-20 MED ORDER — SODIUM CHLORIDE 0.9 % IV SOLN: 250.0000 mL | INTRAVENOUS | Status: DC | PRN

## 2019-10-20 MED ORDER — MAGIC MOUTHWASH
15.0000 mL | Freq: Four times a day (QID) | ORAL | Status: DC | PRN
  Filled 2019-10-20: qty 15

## 2019-10-20 MED ORDER — ALUM & MAG HYDROXIDE-SIMETH 200-200-20 MG/5ML PO SUSP
30.0000 mL | Freq: Four times a day (QID) | ORAL | Status: DC | PRN
  Administered 2019-10-21: 30 mL via ORAL
  Filled 2019-10-20: qty 30

## 2019-10-20 MED ORDER — SODIUM CHLORIDE 0.9% FLUSH: 3.0000 mL | INTRAVENOUS | Status: DC | PRN

## 2019-10-20 MED ORDER — HYDROCORTISONE 1 % EX CREA: 1.0000 "application " | TOPICAL_CREAM | Freq: Three times a day (TID) | CUTANEOUS | Status: DC | PRN

## 2019-10-20 MED ORDER — PSYLLIUM 95 % PO PACK
1.0000 | PACK | Freq: Two times a day (BID) | ORAL | Status: DC
  Administered 2019-10-20 – 2019-10-23 (×7): 1 via ORAL
  Filled 2019-10-20 (×7): qty 1

## 2019-10-20 MED ORDER — METHOCARBAMOL 1000 MG/10ML IJ SOLN
1000.0000 mg | Freq: Four times a day (QID) | INTRAVENOUS | Status: DC | PRN
  Filled 2019-10-20: qty 10

## 2019-10-20 MED ORDER — GUAIFENESIN-DM 100-10 MG/5ML PO SYRP: 10.0000 mL | ORAL_SOLUTION | ORAL | Status: DC | PRN

## 2019-10-20 MED ORDER — SODIUM CHLORIDE 0.9% FLUSH
3.0000 mL | Freq: Two times a day (BID) | INTRAVENOUS | Status: DC
  Administered 2019-10-20 (×2): 3 mL via INTRAVENOUS

## 2019-10-20 MED ORDER — LACTATED RINGERS IV BOLUS: 1000.0000 mL | Freq: Three times a day (TID) | INTRAVENOUS | Status: AC | PRN

## 2019-10-20 MED ORDER — HYDROCORTISONE (PERIANAL) 2.5 % EX CREA: 1.0000 "application " | TOPICAL_CREAM | Freq: Four times a day (QID) | CUTANEOUS | Status: DC | PRN

## 2019-10-20 MED ORDER — PHENOL 1.4 % MT LIQD: 1.0000 | OROMUCOSAL | Status: DC | PRN

## 2019-10-20 MED ORDER — MENTHOL 3 MG MT LOZG: 1.0000 | LOZENGE | OROMUCOSAL | Status: DC | PRN

## 2019-10-20 NOTE — Progress Notes (Signed)
Order to remove foley this A.M. Patient refused and wanted to wait til later when he is more awake due to him feeling as if he would urinate in the bed. I informed foley of infection risk and that MD wanted foley out. Informed day shift nurse and will let MD know when he/she comes around this A.M. Dawson Bills, RN

## 2019-10-20 NOTE — Progress Notes (Signed)
Ice pack applied per orders, patient states he doesn't need it and removed ice pack.

## 2019-10-20 NOTE — Progress Notes (Signed)
Shawn Chambers [id number] 1941/12/06  CARE TEAM:  PCP: [name]  Outpatient Care Team: Patient Care Team: Pa, [place] as PCP - General Ileana Roup, MD as Consulting Physician (General Surgery)  Inpatient Treatment Team: Treatment Team: Attending Provider: Leighton Ruff, MD; Technician: Leda Quail, NT; Technician: Abbe Amsterdam, NT; Registered Nurse: [name], RN   Problem List:   Principal Problem:   Rectal cancer s/p robotic LAR rectosigmoid resction 10/19/2019 Active Problems:   History of Noncompliance with treatment plan   Smoking greater than 40 pack years   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 10/19/2019   History of seizures   Glaucoma   BPH (benign prostatic hyperplasia)   CKD (chronic kidney disease) stage 3, GFR 30-59 ml/min   Anemia of chronic disease   HOH (hard of hearing)   2 Days Post-Op  10/18/2019  POST-OPERATIVE DIAGNOSIS:   RECTAL CANCER  PARASTOMAL HERNIA WITH COLOSTOMY PROLAPSE  PROCEDURE:  XI ROBOTIC ASSISTED LOWER ANTERIOR RESECTION PARASTOMAL HERNIA REPAIR WITH MESH ( SUGAR-BAKER), OSTOMY REVISION CYSTOSCOPY WITH FIREFLY INJECTION  Surgeon(s): Leighton Ruff, MD  Assessment  Gradually recovering  [facility] Stay = 2 days)  Plan:  -Remove Foley today per protocol.  He is hesitant to do that since he wants to be able to walk to the bathroom independently first.  We will try and have a bedside commode as needed but would like to minimize risk of urinary tract infection per protocol.  (He has a history of some noncompliance with recommendations/orders)  -Follow-up on pathology  Gradual advance diet per ERAS recovery pathway  Chronic kidney disease with decent urine output.  Creatinine stable around usually baseline of 1.5  Mild postoperative on top of chronic anemia.  Stable.  Colostomy  care.  Have wound ostomy nurses reevaluate to see if he needs a different appliance now that he has had his chronically prolapsing colostomy revised.  VTE prophylaxis- SCDs, etc  mobilize as tolerated to help recovery.  Do not know about his independence.  Physical occupational therapy evaluation see if he would benefit from home health or further interventions  25 minutes spent in review, evaluation, examination, counseling, and coordination of care.  More than 50% of that time was spent in counseling.  10/20/2019    Subjective: (Chief complaint)  Denies much pain.  Tolerating some liquids.    Hesitant to have Foley catheter removed until he can stand independently.  Objective:  Vital signs:  Vitals:   10/19/19 1005 10/19/19 1305 10/20/19 0631 10/20/19 0641  BP: (!) 121/53 131/65  128/75  Pulse: 73 79  82  Resp: 16 16  16   Temp: 98.6 F (37 C) 99.1 F (37.3 C)  98.2 F (36.8 C)  TempSrc: Oral Oral  Oral  SpO2: 93% 96%  96%  Weight:   83.5 kg   Height:        Last BM Date: 10/17/19  Intake/Output   Yesterday:  11/06 0701 - 11/07 0700 In: 600 [P.O.:600] Out: 3095 [Urine:2925; Drains:165; Stool:5] This shift:  No intake/output data recorded.  Bowel function:  Flatus: No  BM:  No  Drain: Serosanguinous   Physical Exam:  General: Pt awake/alert/oriented x4 in no acute distress Eyes: PERRL, normal EOM.  Sclera clear.  No icterus Neuro: CN II-XII intact w/o focal sensory/motor deficits. Lymph: No head/neck/groin lymphadenopathy Psych:  No delerium/psychosis/paranoia HENT: Normocephalic, Mucus membranes moist.  No thrush.  Very hard of hearing Neck: Supple, No tracheal deviation Chest: No chest wall pain w good excursion CV:  Pulses intact.  Regular rhythm MS: Normal AROM mjr joints.  No obvious deformity  Abdomen: Soft.  Mildy distended.  Mildly tender at incisions only.  Colostomy left upper quadrant with appliance in place.  No leak.  Some edema.  No  evidence of peritonitis.  No incarcerated hernias.  Ext:  No deformity.  No mjr edema.  No cyanosis Skin: No petechiae / purpura  Results:   Cultures: Recent Results (from the past 720 hour(s))  Novel Coronavirus, NAA (Hosp order, Send-out to Ref Lab; TAT 18-24 hrs     Status: None   Collection Time: 09/25/19  1:43 PM   Specimen: Nasopharyngeal Swab; Respiratory  Result Value Ref Range Status   SARS-CoV-2, NAA NOT DETECTED NOT DETECTED Final    Comment: (NOTE) This nucleic acid amplification test was developed and its performance characteristics determined by [employer]. Nucleic acid amplification tests include PCR and TMA. This test has not been FDA cleared or approved. This test has been authorized by FDA under an Emergency Use Authorization (EUA). This test is only authorized for the duration of time the declaration that circumstances exist justifying the authorization of the emergency use of in vitro diagnostic tests for detection of SARS-CoV-2 virus and/or diagnosis of COVID-19 infection under section 564(b)(1) of the Act, 21 U.S.C. [id number]) (1), unless the authorization is terminated or revoked sooner. When diagnostic testing is negative, the possibility of a false negative result should be considered in the context of a patient's recent exposures and the presence of clinical signs and symptoms consistent with COVID-19. An individual without symptoms of COVID- 19 and who is not shedding SARS-CoV-2 vi rus would expect to have a negative (not detected) result in this assay. Performed At: [facility] [address] [place], [place] [id number] Rush Farmer MD [id number]    [place]  Final    Comment: Performed at [facility], [place] [address], [place], [place] [address]  Novel Coronavirus, NAA (Hosp order, Send-out to Ref Lab; TAT 18-24 hrs     Status: None   Collection Time: 10/15/19  1:22 PM   Specimen:  Nasopharyngeal Swab; Respiratory  Result Value Ref Range Status   SARS-CoV-2, NAA NOT DETECTED NOT DETECTED Final    Comment: (NOTE) This nucleic acid amplification test was developed and its performance characteristics determined by [employer]. Nucleic acid amplification tests include PCR and TMA. This test has not been FDA cleared or approved. This test has been authorized by FDA under an Emergency Use Authorization (EUA). This test is only authorized for the duration of time the declaration that circumstances exist justifying the authorization of the emergency use of in vitro diagnostic tests for detection of SARS-CoV-2 virus and/or diagnosis of COVID-19 infection under section 564(b)(1) of the Act, 21 U.S.C. [id number]) (1), unless the authorization is terminated or revoked sooner. When diagnostic testing is negative, the possibility of a false negative result should be considered in the context of a patient's recent exposures and the presence of clinical signs and symptoms consistent with COVID-19. An individual without symptoms of COVID- 19 and who is not shedding SARS-CoV-2 vi rus would expect to have a negative (not detected) result in this assay. Performed At: [facility] [address] [place], [place] [id number] Rush Farmer MD [id number]    [place]  Final    Comment: Performed at [facility]  [facility], [address] [place], [place] [address]    Labs: Results for orders placed or performed during the hospital encounter of 10/18/19 (from the past 48 hour(s))  CBC     Status: Abnormal   Collection Time: 10/19/19  3:06 AM  Result Value Ref Range   WBC 6.8 4.0 - 10.5 K/uL   RBC 3.42 (L) 4.22 - 5.81 MIL/uL   Hemoglobin 10.4 (L) 13.0 - 17.0 g/dL   HCT 31.9 (L) 39.0 - 52.0 %   MCV 93.3 80.0 - 100.0 fL   MCH 30.4 26.0 - 34.0 pg   MCHC 32.6 30.0 - 36.0 g/dL   RDW 12.9 11.5 - 15.5 %   Platelets 116 (L) 150 - 400 K/uL     Comment: REPEATED TO VERIFY PLATELET COUNT CONFIRMED BY SMEAR SPECIMEN CHECKED FOR CLOTS Immature Platelet Fraction may be clinically indicated, consider ordering this additional test [id number]    nRBC 0.0 0.0 - 0.2 %    Comment: Performed at [facility], [place] [address] [place], [place] [id number]  Basic metabolic panel     Status: Abnormal   Collection Time: 10/19/19  3:06 AM  Result Value Ref Range   Sodium 137 135 - 145 mmol/L   Potassium 4.3 3.5 - 5.1 mmol/L   Chloride 103 98 - 111 mmol/L   CO2 25 22 - 32 mmol/L   Glucose, Bld 145 (H) 70 - 99 mg/dL   BUN 17 8 - 23 mg/dL   Creatinine, Ser 1.31 (H) 0.61 - 1.24 mg/dL   Calcium 8.8 (L) 8.9 - 10.3 mg/dL   GFR calc non Af Amer 51 (L) >60 mL/min   GFR calc Af Amer 60 (L) >60 mL/min   Anion gap 9 5 - 15    Comment: Performed at [facility], [place] [address] [place], [place] [address]  CBC     Status: Abnormal   Collection Time: 10/20/19  3:49 AM  Result Value Ref Range   WBC 10.6 (H) 4.0 - 10.5 K/uL   RBC 3.97 (L) 4.22 - 5.81 MIL/uL   Hemoglobin 11.9 (L) 13.0 - 17.0 g/dL   HCT 36.9 (L) 39.0 - 52.0 %   MCV 92.9 80.0 - 100.0 fL   MCH 30.0 26.0 - 34.0 pg   MCHC 32.2 30.0 - 36.0 g/dL   RDW 13.1 11.5 - 15.5 %   Platelets 144 (L) 150 - 400 K/uL   nRBC 0.0 0.0 - 0.2 %    Comment: Performed at [facility], [place] [address], [place], [place] [id number]  Basic metabolic panel     Status: Abnormal   Collection Time: 10/20/19  3:49 AM  Result Value Ref Range   Sodium 135 135 - 145 mmol/L   Potassium 4.4 3.5 - 5.1 mmol/L   Chloride 100 98 - 111 mmol/L   CO2 25 22 - 32 mmol/L   Glucose, Bld 115 (H) 70 - 99 mg/dL   BUN 15 8 - 23 mg/dL   Creatinine, Ser 1.32 (H) 0.61 - 1.24 mg/dL   Calcium 9.6 8.9 - 10.3 mg/dL   GFR calc non Af Amer 51 (L) >60 mL/min   GFR calc Af Amer 59 (L) >60 mL/min   Anion gap 10 5 - 15    Comment: Performed at [facility]  [address] [place], [place] [address]    Imaging / Studies: No results found.  Medications / Allergies: per chart  Antibiotics: Anti-infectives (From  admission, onward)   Start     Dose/Rate Route Frequency Ordered Stop   10/18/19 2000  cefoTEtan (CEFOTAN) 2 g in sodium chloride 0.9 % 100 mL IVPB     2 g 200 mL/hr over 30 Minutes Intravenous Every 12 hours 10/18/19 1424 10/18/19 2127   10/18/19 0600  cefoTEtan (CEFOTAN) 2 g in sodium chloride 0.9 % 100 mL IVPB     2 g 200 mL/hr over 30 Minutes Intravenous On call to O.R. 10/18/19 0551 10/18/19 0824        Note: Portions of this report may have been transcribed using voice recognition software. Every effort was made to ensure accuracy; however, inadvertent computerized transcription errors may be present.   Any transcriptional errors that result from this process are unintentional.     Adin Hector, MD, FACS, MASCRS Gastrointestinal and Minimally Invasive Surgery    [address] [place], [place] [address] [phone] Main / Paging [phone] Fax

## 2019-10-21 DIAGNOSIS — R739 Hyperglycemia, unspecified: Secondary | ICD-10-CM

## 2019-10-21 LAB — GLUCOSE, CAPILLARY
Glucose-Capillary: 146 mg/dL — ABNORMAL HIGH (ref 70–99)
Glucose-Capillary: 165 mg/dL — ABNORMAL HIGH (ref 70–99)

## 2019-10-21 LAB — BASIC METABOLIC PANEL
Anion gap: 11 (ref 5–15)
BUN: 19 mg/dL (ref 8–23)
CO2: 26 mmol/L (ref 22–32)
Calcium: 9.7 mg/dL (ref 8.9–10.3)
Chloride: 98 mmol/L (ref 98–111)
Creatinine, Ser: 1.3 mg/dL — ABNORMAL HIGH (ref 0.61–1.24)
GFR calc Af Amer: >60 mL/min (ref >60)
GFR calc non Af Amer: 52 mL/min — ABNORMAL LOW (ref >60)
Glucose, Bld: 151 mg/dL — ABNORMAL HIGH (ref 70–99)
Potassium: 4.3 mmol/L (ref 3.5–5.1)
Sodium: 135 mmol/L (ref 135–145)

## 2019-10-21 LAB — CBC
HCT: 36.7 % — ABNORMAL LOW (ref 39.0–52.0)
Hemoglobin: 12 g/dL — ABNORMAL LOW (ref 13.0–17.0)
MCH: 30.1 pg (ref 26.0–34.0)
MCHC: 32.7 g/dL (ref 30.0–36.0)
MCV: 92 fL (ref 80.0–100.0)
Platelets: 148 10*3/uL — ABNORMAL LOW (ref 150–400)
RBC: 3.99 MIL/uL — ABNORMAL LOW (ref 4.22–5.81)
RDW: 13.2 % (ref 11.5–15.5)
WBC: 11.5 10*3/uL — ABNORMAL HIGH (ref 4.0–10.5)
nRBC: 0 % (ref 0.0–0.2)

## 2019-10-21 MED ORDER — INSULIN ASPART 100 UNIT/ML ~~LOC~~ SOLN
0.0000 [IU] | Freq: Three times a day (TID) | SUBCUTANEOUS | Status: DC
  Administered 2019-10-21: 2 [IU] via SUBCUTANEOUS
  Administered 2019-10-21 – 2019-10-22 (×2): 3 [IU] via SUBCUTANEOUS
  Administered 2019-10-22: 2 [IU] via SUBCUTANEOUS

## 2019-10-21 MED ORDER — INSULIN ASPART 100 UNIT/ML ~~LOC~~ SOLN: 0.0000 [IU] | Freq: Every day | SUBCUTANEOUS | Status: DC

## 2019-10-21 NOTE — Progress Notes (Signed)
Shawn Chambers [id number] 15-Sep-1941  CARE TEAM:  PCP: [facility]  Outpatient Care Team: Patient Care Team: Pa, [facility] as PCP - General Shawn Roup, MD as Consulting Physician (General Surgery)  Inpatient Treatment Team: Treatment Team: Attending Provider: Leighton Ruff, MD; Technician: Shawn Chambers, NT; Technician: Shawn Chambers, NT; Registered Nurse: Shawn Spanish, RN; Registered Nurse: Shawn Petrin, RN   Problem List:   Principal Problem:   Rectal cancer s/p robotic LAR rectosigmoid resction 10/19/2019 Active Problems:   History of Noncompliance with treatment plan   Smoking greater than 40 pack years   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 10/19/2019   History of seizures   Glaucoma   BPH (benign prostatic hyperplasia)   CKD (chronic kidney disease) stage 3, GFR 30-59 ml/min   Anemia of chronic disease   HOH (hard of hearing)   3 Days Post-Op  10/18/2019  POST-OPERATIVE DIAGNOSIS:   RECTAL CANCER  PARASTOMAL HERNIA WITH COLOSTOMY PROLAPSE  PROCEDURE:  XI ROBOTIC ASSISTED LOWER ANTERIOR RESECTION PARASTOMAL HERNIA REPAIR WITH MESH ( SUGAR-BAKER), OSTOMY REVISION CYSTOSCOPY WITH FIREFLY INJECTION  Surgeon(s): Shawn Ruff, MD  Assessment  Gradually recovering  [facility] Stay = 3 days)  Plan:  -Dysphagia 1/full liquid diet since more bloated.  If feeling better, can readvance tomorrow.  -Follow-up on pathology  Chronic kidney disease with decent urine output.  Creatinine stable around usually baseline of 1.5.  Follow  Borderline diabetes.  Hyperglycemic.  Check A1c.  Sliding scale insulin.  Mild postoperative on top of chronic anemia.  Stable.  Colostomy care.  Have wound ostomy nurses reevaluate to see if he needs a different appliance now that he has had his chronically prolapsing colostomy revised.  VTE  prophylaxis- SCDs, etc  mobilize as tolerated to help recovery.  Physical and Occupational Therapy evaluations with nursing appreciated.  Rolling walker.  Do not know about his independence.  Physical occupational therapy evaluation see if he would benefit from home health or further interventions  Patient tends to do what he wants to do and is not always compliant with the recommendations.  However there is not been any major issues, so we will try and guide him towards recovery safely.  25 minutes spent in review, evaluation, examination, counseling, and coordination of care.  More than 50% of that time was spent in counseling.  10/21/2019    Subjective: (Chief complaint)  Denies much pain.  Tolerating some liquids.      Objective:  Vital signs:  Vitals:   10/20/19 1807 10/20/19 2105 10/21/19 0551 10/21/19 0705  BP: 140/85 121/72 100/72   Pulse: 100 (!) 110 (!) 106   Resp:  18 16   Temp:  99.3 F (37.4 C) 97.9 F (36.6 C)   TempSrc:  Oral Oral   SpO2: 94% 92% 93%   Weight:    84.8 kg  Height:        Last BM Date: 10/17/19  Intake/Output   Yesterday:  11/07 0701 - 11/08 0700 In: 900 [P.O.:900] Out: 795 [Urine:600; Drains:95; Stool:100] This shift:  No intake/output data recorded.  Bowel function:  Flatus: No  BM:  No  Drain: Serosanguinous   Physical Exam:  General: Pt awake/alert/oriented x4 in no acute distress Eyes: PERRL, normal EOM.  Sclera clear.  No icterus Neuro: CN II-XII intact w/o focal sensory/motor deficits. Lymph: No head/neck/groin lymphadenopathy Psych:  No delerium/psychosis/paranoia HENT: Normocephalic, Mucus membranes moist.  No thrush.  Very hard of hearing Neck: Supple, No tracheal deviation Chest: No chest wall pain w good excursion CV:  Pulses intact.  Regular rhythm MS: Normal AROM mjr joints.  No obvious deformity  Abdomen: Soft.  Moderately distended.  Mildly tender at incisions only.  Colostomy left upper quadrant with  appliance in place.  No gas.  Scant stool.  No leak.  Some edema.  No evidence of peritonitis.  No incarcerated hernias.  Ext:  No deformity.  No mjr edema.  No cyanosis Skin: No petechiae / purpura  Results:   Cultures: Recent Results (from the past 720 hour(s))  Novel Coronavirus, NAA (Hosp order, Send-out to Ref Lab; TAT 18-24 hrs     Status: None   Collection Time: 09/25/19  1:43 PM   Specimen: Nasopharyngeal Swab; Respiratory  Result Value Ref Range Status   SARS-CoV-2, NAA NOT DETECTED NOT DETECTED Final    Comment: (NOTE) This nucleic acid amplification test was developed and its performance characteristics determined by [employer]. Nucleic acid amplification tests include PCR and TMA. This test has not been FDA cleared or approved. This test has been authorized by FDA under an Emergency Use Authorization (EUA). This test is only authorized for the duration of time the declaration that circumstances exist justifying the authorization of the emergency use of in vitro diagnostic tests for detection of SARS-CoV-2 virus and/or diagnosis of COVID-19 infection under section 564(b)(1) of the Act, 21 U.S.C. [id number]) (1), unless the authorization is terminated or revoked sooner. When diagnostic testing is negative, the possibility of a false negative result should be considered in the context of a patient's recent exposures and the presence of clinical signs and symptoms consistent with COVID-19. An individual without symptoms of COVID- 19 and who is not shedding SARS-CoV-2 vi rus would expect to have a negative (not detected) result in this assay. Performed At: [facility] [address] [place], [place] [id number] Rush Farmer MD [id number]    [place]  Final    Comment: Performed at [facility], [place] [address] [place], [place] [address]  Novel Coronavirus, NAA (Hosp order, Send-out to Ref Lab; TAT 18-24 hrs      Status: None   Collection Time: 10/15/19  1:22 PM   Specimen: Nasopharyngeal Swab; Respiratory  Result Value Ref Range Status   SARS-CoV-2, NAA NOT DETECTED NOT DETECTED Final    Comment: (NOTE) This nucleic acid amplification test was developed and its performance characteristics determined by [employer]. Nucleic acid amplification tests include PCR and TMA. This test has not been FDA cleared or approved. This test has been authorized by FDA under an Emergency Use Authorization (EUA). This test is only authorized for the duration of time the declaration that circumstances exist justifying the authorization of the emergency use of in vitro diagnostic tests for detection of SARS-CoV-2 virus and/or diagnosis of COVID-19 infection under section 564(b)(1) of the Act, 21 U.S.C. [id number]) (1), unless the authorization is terminated or revoked sooner. When diagnostic testing is negative, the possibility of a false negative result should be considered in the context of a patient's recent exposures and the presence of clinical signs and symptoms consistent with COVID-19. An individual without symptoms of COVID- 19 and who is not shedding SARS-CoV-2 vi rus would expect to have a negative (not detected) result in this assay. Performed At: [facility] [address] [place], [place] [id number] Rush Farmer MD [id number]    [place]  Final  Comment: Performed at [facility], [place] [address], [place], [place] [address]    Labs: Results for orders placed or performed during the hospital encounter of 10/18/19 (from the past 48 hour(s))  CBC     Status: Abnormal   Collection Time: 10/20/19  3:49 AM  Result Value Ref Range   WBC 10.6 (H) 4.0 - 10.5 K/uL   RBC 3.97 (L) 4.22 - 5.81 MIL/uL   Hemoglobin 11.9 (L) 13.0 - 17.0 g/dL   HCT 36.9 (L) 39.0 - 52.0 %   MCV 92.9 80.0 - 100.0 fL   MCH 30.0 26.0 - 34.0 pg   MCHC 32.2 30.0 - 36.0 g/dL    RDW 13.1 11.5 - 15.5 %   Platelets 144 (L) 150 - 400 K/uL   nRBC 0.0 0.0 - 0.2 %    Comment: Performed at [facility] [place] [address], [place], [place] [id number]  Basic metabolic panel     Status: Abnormal   Collection Time: 10/20/19  3:49 AM  Result Value Ref Range   Sodium 135 135 - 145 mmol/L   Potassium 4.4 3.5 - 5.1 mmol/L   Chloride 100 98 - 111 mmol/L   CO2 25 22 - 32 mmol/L   Glucose, Bld 115 (H) 70 - 99 mg/dL   BUN 15 8 - 23 mg/dL   Creatinine, Ser 1.32 (H) 0.61 - 1.24 mg/dL   Calcium 9.6 8.9 - 10.3 mg/dL   GFR calc non Af Amer 51 (L) >60 mL/min   GFR calc Af Amer 59 (L) >60 mL/min   Anion gap 10 5 - 15    Comment: Performed at [place] [address] [place], [place] [address]  Magnesium - add on     Status: None   Collection Time: 10/20/19  3:49 AM  Result Value Ref Range   Magnesium 1.9 1.7 - 2.4 mg/dL    Comment: Performed at [facility], [place] [address], [place], [place] [address]  Phosphorus - add on     Status: None   Collection Time: 10/20/19  3:49 AM  Result Value Ref Range   Phosphorus 2.5 2.5 - 4.6 mg/dL    Comment: Performed at [facility], [place] [address], [place], [place] [address]  Hepatic function panel     Status: Abnormal   Collection Time: 10/20/19  3:49 AM  Result Value Ref Range   Total Protein 6.7 6.5 - 8.1 g/dL   Albumin 3.7 3.5 - 5.0 g/dL   AST 19 15 - 41 U/L   ALT 15 0 - 44 U/L   Alkaline Phosphatase 45 38 - 126 U/L   Total Bilirubin 1.3 (H) 0.3 - 1.2 mg/dL   Bilirubin, Direct 0.2 0.0 - 0.2 mg/dL   Indirect Bilirubin 1.1 (H) 0.3 - 0.9 mg/dL    Comment: Performed at [facility], [place] [address], [place], [place] [address]  Prealbumin - add on     Status: Abnormal   Collection Time: 10/20/19  9:12 AM  Result Value Ref Range   Prealbumin 17.6 (L) 18 - 38 mg/dL    Comment: Performed at [facility], [place] [address]  [place], [place] [address]  CBC     Status: Abnormal   Collection Time: 10/21/19  3:29 AM  Result Value Ref Range   WBC 11.5 (H) 4.0 - 10.5 K/uL   RBC 3.99 (L) 4.22 - 5.81 MIL/uL   Hemoglobin 12.0 (L)  13.0 - 17.0 g/dL   HCT 36.7 (L) 39.0 - 52.0 %   MCV 92.0 80.0 - 100.0 fL   MCH 30.1 26.0 - 34.0 pg   MCHC 32.7 30.0 - 36.0 g/dL   RDW 13.2 11.5 - 15.5 %   Platelets 148 (L) 150 - 400 K/uL   nRBC 0.0 0.0 - 0.2 %    Comment: Performed at [facility], [place] [address] [place], [place] [id number]  Basic metabolic panel     Status: Abnormal   Collection Time: 10/21/19  3:29 AM  Result Value Ref Range   Sodium 135 135 - 145 mmol/L   Potassium 4.3 3.5 - 5.1 mmol/L   Chloride 98 98 - 111 mmol/L   CO2 26 22 - 32 mmol/L   Glucose, Bld 151 (H) 70 - 99 mg/dL   BUN 19 8 - 23 mg/dL   Creatinine, Ser 1.30 (H) 0.61 - 1.24 mg/dL   Calcium 9.7 8.9 - 10.3 mg/dL   GFR calc non Af Amer 52 (L) >60 mL/min   GFR calc Af Amer >60 >60 mL/min   Anion gap 11 5 - 15    Comment: Performed at [facility], [place] [address] [place], [place] [address]    Imaging / Studies: No results found.  Medications / Allergies: per chart  Antibiotics: Anti-infectives (From admission, onward)   Start     Dose/Rate Route Frequency Ordered Stop   10/18/19 2000  cefoTEtan (CEFOTAN) 2 g in sodium chloride 0.9 % 100 mL IVPB     2 g 200 mL/hr over 30 Minutes Intravenous Every 12 hours 10/18/19 1424 10/18/19 2127   10/18/19 0600  cefoTEtan (CEFOTAN) 2 g in sodium chloride 0.9 % 100 mL IVPB     2 g 200 mL/hr over 30 Minutes Intravenous On call to O.R. 10/18/19 0551 10/18/19 0824        Note: Portions of this report may have been transcribed using voice recognition software. Every effort was made to ensure accuracy; however, inadvertent computerized transcription errors may be present.   Any transcriptional errors that result from this process are unintentional.     Adin Hector, MD,  FACS, MASCRS Gastrointestinal and Minimally Invasive Surgery    [address] [place], [place] [address] [phone] Main / Paging [phone] Fax

## 2019-10-22 LAB — GLUCOSE, CAPILLARY
Glucose-Capillary: 183 mg/dL — ABNORMAL HIGH (ref 70–99)
Glucose-Capillary: 138 mg/dL — ABNORMAL HIGH (ref 70–99)
Glucose-Capillary: 158 mg/dL — ABNORMAL HIGH (ref 70–99)
Glucose-Capillary: 146 mg/dL — ABNORMAL HIGH (ref 70–99)

## 2019-10-22 LAB — CREATININE, SERUM
Creatinine, Ser: 1.42 mg/dL — ABNORMAL HIGH (ref 0.61–1.24)
GFR calc Af Amer: 54 mL/min — ABNORMAL LOW (ref >60)
GFR calc non Af Amer: 47 mL/min — ABNORMAL LOW (ref >60)

## 2019-10-22 LAB — POTASSIUM: Potassium: 3.6 mmol/L (ref 3.5–5.1)

## 2019-10-22 LAB — HEMOGLOBIN A1C
Hgb A1c MFr Bld: 5.7 % — ABNORMAL HIGH (ref 4.8–5.6)
Mean Plasma Glucose: 116.89 mg/dL

## 2019-10-22 MED ORDER — TRAMADOL HCL 50 MG PO TABS: 50.0000 mg | ORAL_TABLET | Freq: Four times a day (QID) | ORAL | 0 refills | Status: DC | PRN

## 2019-10-22 NOTE — Care Management Important Message (Signed)
Important Message  Patient Details IM Letter given to Nancy Marus RN to present to the Patient Name: Bingham Picciotto MRN: [id number] Date of Birth: November 21, 1941   Medicare Important Message Given:  Yes     [name] 10/22/2019, 10:53 AM

## 2019-10-22 NOTE — Progress Notes (Signed)
Occupational Therapy Treatment Patient Details Name: Shawn Chambers MRN: [id number] DOB: January 20, 1941 Today's Date: 10/22/2019    History of present illness Pt is 78 y/o M with hx: rectal cancer, now s/p robotic LAR, colostomy revision, parastomal hernia repair.   OT comments  Pt seen this date for f/u re: LB task modification d/t abd pain. Pt with reduced pain this date and OT instructs in modified cross-leg technique for LB dressing to prevent strain from forward fold. Pt demos good understanding. D/c plans remain appropriate.     Follow Up Recommendations  Home health OT;Supervision/Assistance - 24 hour    Equipment Recommendations       Recommendations for Other Services      Precautions / Restrictions Precautions Precautions: Fall Precaution Comments: drain Restrictions Weight Bearing Restrictions: No       Mobility Bed Mobility Overal bed mobility: Modified Independent                Transfers Overall transfer level: Needs assistance Equipment used: None Transfers: Sit to/from Stand Sit to Stand: Supervision              Balance Overall balance assessment: No apparent balance deficits (not formally assessed)                                         ADL either performed or assessed with clinical judgement   ADL Overall ADL's : Needs assistance/impaired                                       General ADL Comments: Pt performs fxl mobility in hall and to restroom with SBA with no AD. Pt demos safe commode t/f with SBA. Pt requires Supv to don socks in sitting with MIN verbal cues to utilize cross leg technique to prevent strenuous forward fold 2/2 pain. Pt demos good understanding of modified technique and presents with less pain this date.     Vision Patient Visual Report: No change from baseline     Perception     Praxis      Cognition Arousal/Alertness: Awake/alert Behavior During Therapy: WFL for tasks  assessed/performed Overall Cognitive Status: Within Functional Limits for tasks assessed                                          Exercises Other Exercises Other Exercises: OT facilitates education re: modified technique for LB dressing with pt demonstrating good understanding.   Shoulder Instructions       General Comments      Pertinent Vitals/ Pain       Pain Assessment: 0-10 Pain Score: 4  Pain Location: abdomen, only with bending Pain Descriptors / Indicators: Discomfort Pain Intervention(s): Monitored during session  Home Living                                          Prior Functioning/Environment              Frequency  Min 2X/week        Progress Toward Goals  OT Goals(current goals can now be found in  the care plan section)  Progress towards OT goals: Progressing toward goals  Acute Rehab OT Goals Patient Stated Goal: To return home OT Goal Formulation: With patient Time For Goal Achievement: 11/02/19 Potential to Achieve Goals: Good  Plan Discharge plan remains appropriate    Co-evaluation                 AM-PAC OT "6 Clicks" Daily Activity     Outcome Measure   Help from another person eating meals?: None Help from another person taking care of personal grooming?: A Little Help from another person toileting, which includes using toliet, bedpan, or urinal?: A Lot(for colostomy mgt) Help from another person bathing (including washing, rinsing, drying)?: A Little Help from another person to put on and taking off regular upper body clothing?: A Little Help from another person to put on and taking off regular lower body clothing?: A Little 6 Click Score: 18    End of Session Equipment Utilized During Treatment: Gait belt  OT Visit Diagnosis: Muscle weakness (generalized) (M62.81)   Activity Tolerance Patient tolerated treatment well   Patient Left in bed;with call bell/phone within reach;with bed  alarm set   Nurse Communication          Time: [id number] OT Time Calculation (min): 18 min  Charges: OT General Charges $OT Visit: 1 Visit OT Treatments $Self Care/Home Management : 8-22 mins  Gerrianne Scale, MS, OTR/L Pager [phone] 10/22/19, 12:43 PM

## 2019-10-22 NOTE — Progress Notes (Signed)
Pharmacy Brief Note - Alvimopan (Entereg)  The standing order set for alvimopan (Entereg) now includes an automatic order to discontinue the drug after the patient has had a bowel movement. The change was approved by the [place] and the Medical Executive Committee.   This patient has had bowel movements documented by nursing. Therefore, alvimopan has been discontinued. If there are questions, please contact the pharmacy at [phone].   Thank you-  Minda Ditto PharmD 10/22/2019, 3:10 PM

## 2019-10-22 NOTE — Discharge Instructions (Addendum)
ABDOMINAL SURGERY: POST OP INSTRUCTIONS  1. DIET: Follow a light bland diet the first 24 hours after arrival home, such as soup, liquids, crackers, etc.  Be sure to include lots of fluids daily.  Avoid fast food or heavy meals as your are more likely to get nauseated.  Do not eat any uncooked fruits or vegetables for the next 2 weeks as your colon heals. 2. Take your usually prescribed home medications unless otherwise directed. 3. PAIN CONTROL: a. Pain is best controlled by a usual combination of three different methods TOGETHER: i. Ice/Heat ii. Over the counter pain medication iii. Prescription pain medication b. Most patients will experience some swelling and bruising around the incisions.  Ice packs or heating pads (30-60 minutes up to 6 times a day) will help. Use ice for the first few days to help decrease swelling and bruising, then switch to heat to help relax tight/sore spots and speed recovery.  Some people prefer to use ice alone, heat alone, alternating between ice & heat.  Experiment to what works for you.  Swelling and bruising can take several weeks to resolve.   c. It is helpful to take an over-the-counter pain medication regularly for the first few weeks.  Choose one of the following that works best for you: i. Naproxen (Aleve, etc)  Two 243m tabs twice a day ii. Ibuprofen (Advil, etc) Three 2036mtabs four times a day (every meal & bedtime) iii. Acetaminophen (Tylenol, etc) 500-65045mour times a day (every meal & bedtime) d. A  prescription for pain medication (such as oxycodone, hydrocodone, etc) should be given to you upon discharge.  Take your pain medication as prescribed.  i. If you are having problems/concerns with the prescription medicine (does not control pain, nausea, vomiting, rash, itching, etc), please call us [phone] see if we need to switch you to a different pain medicine that will work better for you and/or control your side effect better. ii. If you  need a refill on your pain medication, please contact your pharmacy.  They will contact our office to request authorization. Prescriptions will not be filled after 5 pm or on week-ends. 4. Avoid getting constipated.  Between the surgery and the pain medications, it is common to experience some constipation.  Increasing fluid intake and taking a fiber supplement (such as Metamucil, Citrucel, FiberCon, MiraLax, etc) 1-2 times a day regularly will usually help prevent this problem from occurring.  A mild laxative (prune juice, Milk of Magnesia, MiraLax, etc) should be taken according to package directions if there are no bowel movements after 48 hours.   5. Watch out for diarrhea.  If you have many loose bowel movements, simplify your diet to bland foods & liquids for a few days.  Stop any stool softeners and decrease your fiber supplement.  Switching to mild anti-diarrheal medications (Kayopectate, Pepto Bismol) can help.  If this worsens or does not improve, please call us.[place]. Wash / shower every day.  You may shower over the incision / wound.  Avoid baths until the skin is fully healed.  Continue to shower over incision(s) after the dressing is off. 7. Remove your waterproof bandages 5 days after surgery.  You may leave the incision open to air.  You may replace a dressing/Band-Aid to cover the incision for comfort if you wish. 8. ACTIVITIES as tolerated:   a. You may resume regular (light) daily activities beginning the next day--such as daily self-care, walking, climbing stairs--gradually increasing activities as  tolerated.  If you can walk 30 minutes without difficulty, it is safe to try more intense activity such as jogging, treadmill, bicycling, low-impact aerobics, swimming, etc. b. Save the most intensive and strenuous activity for last such as sit-ups, heavy lifting, contact sports, etc  Refrain from any heavy lifting or straining until you are off narcotics for pain control.   c. DO NOT PUSH THROUGH  PAIN.  Let pain be your guide: If it hurts to do something, don't do it.  Pain is your body warning you to avoid that activity for another week until the pain goes down. d. You may drive when you are no longer taking prescription pain medication, you can comfortably wear a seatbelt, and you can safely maneuver your car and apply brakes. e. Dennis Bast may have sexual intercourse when it is comfortable.  9. FOLLOW UP in our office a. Please call [facility] at [phone] to set up an appointment to see your surgeon in the office for a follow-up appointment approximately 1-2 weeks after your surgery. b. Make sure that you call for this appointment the day you arrive home to insure a convenient appointment time. 10. IF YOU HAVE DISABILITY OR FAMILY LEAVE FORMS, BRING THEM TO THE OFFICE FOR PROCESSING.  DO NOT GIVE THEM TO YOUR DOCTOR.   WHEN TO CALL us [phone]: 1. Poor pain control 2. Reactions / problems with new medications (rash/itching, nausea, etc)  3. Fever over 101.5 F (38.5 C) 4. Inability to urinate 5. Nausea and/or vomiting 6. Worsening swelling or bruising 7. Continued bleeding from incision. 8. Increased pain, redness, or drainage from the incision  The clinic staff is available to answer your questions during regular business hours (8:30am-5pm).  Please dont hesitate to call and ask to speak to one of our nurses for clinical concerns.   A surgeon from [facility] is always on call at the hospitals   If you have a medical emergency, go to the nearest emergency room or call 911.    [facility], [place], [place], [place], [place]  [address] ? MAIN: [phone] ? TOLL FREE: [phone] ? FAX [phone] [email or web address]  Ostomy Support Information  Theresia Majors heard that people get along just fine with only one of their eyes, or one of their lungs, or one of their kidneys. But you also know that you have only one intestine  and only one bladder, and that leaves you feeling awfully empty, both physically and emotionally: You think no other people go around without part of their intestine with the ends of their intestines sticking out through their abdominal walls.   YOU ARE NOT ALONE.  There are nearly three quarters of a million people in the [place] who have an ostomy; people who have had surgery to remove all or part of their colons or bladders.   There is even a national association, the [place] [employer] [place] with over 350 local affiliated support groups that are organized by volunteers who provide peer support and counseling. Juan Quam has a toll free telephone num-ber, [phone] and an educational, interactive website, www.ostomy.org   An ostomy is an opening in the belly (abdominal wall) made by surgery. Ostomates are people who have had this procedure. The opening (stoma) allows the kidney or bowel to discharge waste. An external pouch covers the stoma to collect waste. Pouches are are a simple bag and are odor free. Different companies have disposable or reusable pouches to fit  one's lifestyle. An ostomy can either be temporary or permanent.   THERE ARE THREE MAIN TYPES OF OSTOMIES  Colostomy. A colostomy is a surgically created opening in the large intestine (colon).  Ileostomy. An ileostomy is a surgically created opening in the small intestine.  Urostomy. A urostomy is a surgically created opening to divert urine away from the bladder.  OSTOMY Care  The following guidelines will make care of your colostomy easier. Keep this information close by for quick reference.  Helpful DIET hints Eat a well-balanced diet including vegetables and fresh fruits. Eat on a regular schedule. Drink at least 6 to 8 glasses of fluids daily. Eat slowly in a relaxed atmosphere. Chew your food thoroughly. Avoid chewing gum, smoking, and drinking from a straw. This will help decrease the amount of air you swallow,  which may help reduce gas. Eating yogurt or drinking buttermilk may help reduce gas.  To control gas at night, do not eat after 8 p.m. This will give your bowel time to quiet down before you go to bed.  If gas is a problem, you can purchase Beano. Sprinkle Beano on the first bite of food before eating to reduce gas. It has no flavor and should not change the taste of your food. You can buy Beano over the counter at your local drugstore.  Foods like fish, onions, garlic, broccoli, asparagus, and cabbage produce odor. Although your pouch is odor-proof, if you eat these foods you may notice a stronger odor when emptying your pouch. If this is a concern, you may want to limit these foods in your diet.  If you have an ileostomy, you will have chronic diarrhea & need to drink more liquids to avoid getting dehydrated.  Consider antidiarrheal medicine like imodium (loperamide) or Lomotil to help slow down bowel movements / diarrhea into your ileostomy bag.  GETTING TO GOOD BOWEL HEALTH WITH AN ILEOSTOMY . Irregular bowel habits such as constipation and diarrhea can lead to many problems over time.  The goal: 3-6 small BOWEL MOVEMENTS A DAY!  To have soft, regular bowel movements:   Drink plenty of fluids, consider 4-6 tall glasses of water a day.    Controlling diarrheA  o Switch to liquids and simpler foods for a few days to avoid stressing your intestines further. o Avoid dairy products (especially milk & ice cream) for a short time.  The intestines often can lose the ability to digest lactose when stressed. o Avoid foods that cause gassiness or bloating.  Typical foods include beans and other legumes, cabbage, broccoli, and dairy foods.  Every person has some sensitivity to other foods, so listen to our body and avoid those foods that trigger problems for you. o Adding fiber (Citrucel, Metamucil, psyllium, Miralax) gradually can help thicken stools by absorbing excess fluid and retrain the  intestines to act more normally.  Slowly increase the dose over a few weeks.  Too much fiber too soon can backfire and cause cramping & bloating. o Probiotics (such as active yogurt, Align, etc) may help repopulate the intestines and colon with normal bacteria and calm down a sensitive digestive tract.  Most studies show it to be of mild help, though, and such products can be costly. o Medicines: - Bismuth subsalicylate (ex. Kayopectate, Pepto Bismol) every 30 minutes for up to 6 doses can help control diarrhea.  Avoid if pregnant. - Loperamide (Immodium) can slow down diarrhea.  Start with two tablets (57m total) first and then try one tablet  every 6 hours.  Avoid if you are having fevers or severe pain.  If you are not better or start feeling worse, stop all medicines and call your doctor for advice o Call your doctor if you are getting worse or not better.  Sometimes further testing (cultures, endoscopy, X-ray studies, bloodwork, etc) may be needed to help diagnose and treat the cause of the diarrhea.  TROUBLESHOOTING IRREGULAR BOWELS 1) Avoid extremes of bowel movements (no bad constipation/diarrhea) 2) Miralax 17gm mixed in 8oz. water or juice-daily. May use twice a day as needed  3) Gas-x,Phazyme, etc. as needed for gas & bloating.  4) Soft,bland diet. No spicy,greasy,fried foods.  5) Prilosec (omeprazole) over-the-counter as needed  6) May hold gluten/wheat products from diet to see if symptoms improve.  7)  May try probiotics (Align, Activa, etc) to help calm the bowels down 7) If symptoms become worse call back immediately.   Applying the pouching system To apply your pouch, follow these steps:  Place all your equipment close at hand before removing your pouch.  Wash your hands.  Stand or sit in front of a mirror. Use the position that works best for you. Remember that you must keep the skin around the stoma wrinkle-free for a good seal.  Gently remove the used pouch (1-piece  system) or the pouch and old wafer (2-piece system). Empty the pouch into the toilet. Save the closure clip to use again.  Wash the stoma itself and the skin around the stoma. Your stoma may bleed a little when being washed. This is normal. Rinse and pat dry. You may use a wash cloth or soft paper towels (like Bounty), mild soap (like Dial, Safeguard, or [place]), and water. Avoid soaps that contain perfumes or lotions.  For a new pouch (1-piece system) or a new wafer (2-piece system), measure your stoma using the stoma guide in each box of supplies.  Trace the shape of your stoma onto the back of the new pouch or the back of the new wafer. Cut out the opening. Remove the paper backing and set it aside.  Optional: Apply a skin barrier powder to surrounding skin if it is irritated (bare or weeping), and dust off the excess. Optional: Apply a skin-prep wipe (such as Skin Prep or All-Kare) to the skin around the stoma, and let it dry. Do not apply this solution if the skin is irritated (red, tender, or broken) or if you have shaved around the stoma. Optional: Apply a skin barrier paste (such as Stomahesive, Coloplast, or Premium) around the opening cut in the back of the pouch or wafer. Allow it to dry for 30 to 60 seconds.  Hold the pouch (1-piece system) or wafer (2-piece system) with the sticky side toward your body. Make sure the skin around the stoma is wrinkle-free. Center the opening on the stoma, then press firmly to your abdomen (Fig. 4). Look in the mirror to check if you are placing the pouch, or wafer, in the right position. For a 2-piece system, snap the pouch onto the wafer. Make sure it snaps into place securely.  Place your hand over the stoma and the pouch or wafer for about 30 seconds. The heat from your hand can help the pouch or wafer stick to your skin.  Add deodorant (such as Super Banish or Nullo) to your pouch. Other options include food extracts such as vanilla oil and peppermint  extract. Add about 10 drops of the deodorant to the pouch. Then  apply the closure clamp. Note: Do not use toxic  chemicals or commercial cleaning agents in your pouch. These substances may harm the stoma.  Optional: For extra seal, apply tape to all 4 sides around the pouch or wafer, as if you were framing a picture. You may use any brand of medical adhesive tape. Change your pouch every 5 to 7 days. Change it immediately if a leak occurs.  Wash your hands afterwards.  If you are wearing a 2-piece system, you may use 2 new pouches per week and alternate them. Rinse the pouch with mild soap and warm water and hang it to dry for the next day. Apply the fresh pouch. Alternate the 2 pouches like this for a week. After a week, change the wafer and begin with 2 new pouches. Place the old pouches in a plastic bag, and put them in the trash.    Tips for colostomy care  Applying Your Pouch You may stand or sit to apply your pouch.  Keep the skin where you apply the pouch wrinkle-free. If the skin around the pouch is wrinkled, the seal may break when your skin stretches.  If hair grows close to your stoma, you may trim off the hair with scissors, an electric razor, or a safety razor.  Always have a mirror nearby so you can get a better view of your stoma.  When you apply a new pouch, write the date on the adhesive tape. This will remind you of when you last changed your pouch.  Changing Your Pouch The best time to change your pouch is in the morning, before eating or drinking anything. Your stoma can function at any time, but it will function more after eating or drinking.  Emptying Your Pouch Empty your pouch when it is one-third full (of urine, stool, and/or gas). If you wait until your pouch is fuller than this, it will be more difficult to empty and more noticeable. When you empty your pouch, either put toilet paper in the toilet bowl first, or flush the toilet while you empty the pouch. This  will reduce splashing. You can empty the pouch between your legs or to one side while sitting, or while standing or stooping. If you have a 2-piece system, you can snap off the pouch to empty it. Remember that your stoma may function during this time. If you wish to rinse your pouch after you empty it, a [place] baster can be helpful. When using a baster, squirt water up into the pouch through the opening at the bottom. With a 2-piece system, you can snap off the pouch to rinse it. After rinsing  your pouch, empty it into the toilet. When rinsing your pouch at home, put a few granules of Dreft soap in the rinse water. This helps lubricate and freshen your pouch. The inside of your pouch can be sprayed with non-stick cooking oil (Pam spray). This may help reduce stool sticking to the inside of the pouch.  Bathing You may shower or bathe with your pouch on or off. Remember that your stoma may function during this time.  The materials you use to wash your stoma and the skin around it should be clean, but they do not need to be sterile.  Wearing Your Pouch During hot weather, or if you perspire a lot in general, wear a cover over your pouch. This may prevent a rash on your skin under the pouch. Pouch covers are sold at ostomy supply stores. Wear  the pouch inside your underwear for better support. Watch your weight. Any gain or loss of 10 to 15 pounds or more can change the way your pouch fits.  Going Away From Home A collapsible cup (like those that come in travel kits) or a soft plastic squirt bottle with a pull-up top (like a travel bottle for shampoo) can be used for rinsing your pouch when you are away from home. Tilt the opening of the pouch at an upward angle when using a cup to rinse.  Carry wet wipes or extra tissues to use in public bathrooms.  Carry an extra pouching system with you at all times.  Never keep ostomy supplies in the glove compartment of your car. Extreme heat or cold can  damage the skin barriers and adhesive wafers on the pouch.  When you travel, carry your ostomy supplies with you at all times. Keep them within easy reach. Do not pack ostomy supplies in baggage that will be checked or otherwise separated from you, because your baggage might be lost. If youre traveling out of the country, it is helpful to have a letter stating that you are carrying ostomy supplies as a medical necessity.  If you need ostomy supplies while traveling, look in the yellow pages of the telephone book under Surgical Supplies. Or call the local ostomy organization to find out where supplies are available.  Do not let your ostomy supplies get low. Always order new pouches before you use the last one.  Reducing Odor Limit foods such as broccoli, cabbage, onions, fish, and garlic in your diet to help reduce odor. Each time you empty your pouch, carefully clean the opening of the pouch, both inside and outside, with toilet paper. Rinse your pouch 1 or 2 times daily after you empty it (see directions for emptying your pouch and going away from home). Add deodorant (such as Super Banish or Nullo) to your pouch. Use air deodorizers in your bathroom. Do not add aspirin to your pouch. Even though aspirin can help prevent odor, it could cause ulcers on your stoma.  When to call the doctor Call the doctor if you have any of the following symptoms: Purple, black, or white stoma Severe cramps lasting more than 6 hours Severe watery discharge from the stoma lasting more than 6 hours No output from the colostomy for 3 days Excessive bleeding from your stoma Swelling of your stoma to more than 1/2-inch larger than usual Pulling inward of your stoma below skin level Severe skin irritation or deep ulcers Bulging or other changes in your abdomen  When to call your ostomy nurse Call your ostomy/enterostomal therapy (ET) nurse if any of the following occurs: Frequent leaking of your pouching  system Change in size or appearance of your stoma, causing discomfort or problems with your pouch Skin rash or rawness Weight gain or loss that causes problems with your pouch      FREQUENTLY ASKED QUESTIONS   Why havent you met any of these folks who have an ostomy?  Well, maybe you have! You just did not recognize them because an ostomy doesn't show. It can be kept secret if you wish. Why, maybe some of your best friends, office associates or neighbors have an ostomy ... you never can tell.   People facing ostomy surgery have many quality-of-life questions like:  Will you bulge? Smell? Make noises? Will you feel waste leaving your body? Will you be a captive of the toilet? Will you starve? Be  a social outcast? Get/stay married? Have babies? Easily bathe, go swimming, bend over?  OK, lets look at what you can expect:   Will you bulge?  Remember, without part of the intestine or bladder, and its contents, you should have a flatter tummy than before. You can expect to wear, with little exception, what you wore before surgery ... and this in-cludes tight clothing and bathing suits.   Will you smell?  Today, thanks to modern odor proof pouching systems, you can walk into an ostomy support group meeting and not smell anything that is foul or offensive. And, for those with an ileostomy or colostomy who are concerned about odor when emptying their pouch, there are in-pouch deodorants that can be used to eliminate any waste odors that may exist.   Will you make noises?  Everyone produces gas, especially if they are an air-swallower. But intestinal sounds that occur from time to time are no differ-ent than a gurgling tummy, and quite often your clothing will muffle any sounds.   Will you feel the waste discharges?  For those with a colostomy or ileostomy there might be a slight pressure when waste leaves your body, but understand that the intestines have no nerve endings, so there will be no  unpleasant sensations. Those with a urostomy will probably be unaware of any kidney drainage.   Will you be a captive of the toilet?  Immediately post-op you will spend more time in the bathroom than you will after your body recovers from surgery. Every person is different, but on average those with an ileostomy or urostomy may empty their pouches 4 to 6 times a day; a little  less if you have a colostomy. The average wear time between pouch system changes is 3 to 5 days and the changing process should take less than 30 minutes.   Will I need to be on a special diet? Most people return to their normal diet when they have recovered from surgery. Be sure to chew your food well, eat a well-balanced diet and drink plenty of fluids. If you experience problems with a certain food, wait a couple of weeks and try it again.  Will there be odor and noises? Pouching systems are designed to be odor-proof or odor-resistant. There are deodorants that can be used in the pouch. Medications are also available to help reduce odor. Limit gas-producing foods and carbonated beverages. You will experience less gas and fewer noises as you heal from surgery.  How much time will it take to care for my ostomy? At first, you may spend a lot of time learning about your ostomy and how to take care of it. As you become more comfortable and skilled at changing the pouching system, it will take very little time to care for it.   Will I be able to return to work? People with ostomies can perform most jobs. As soon as you have healed from surgery, you should be able to return to work. Heavy lifting (more than 10 pounds) may be discouraged.   What about intimacy? Sexual relationships and intimacy are important and fulfilling aspects of your life. They should continue after ostomy surgery. Intimacy-related concerns should be discussed openly between you and your partner.   Can I wear regular clothing? You do not need to wear  special clothing. Ostomy pouches are fairly flat and barely noticeable. Elastic undergarments will not hurt the stoma or prevent the ostomy from functioning.   Can I participate in sports?  An ostomy should not limit your involvement in sports. Many people with ostomies are runners, skiers, swimmers or participate in other active lifestyles. Talk with your caregiver first before doing heavy physical activity.  Will you starve?  Not if you follow doctors orders at each stage of your post-op adjustment. There is no such thing as an ostomy diet. Some people with an ostomy will be able to eat and tolerate anything; others may find diffi-culty with some foods. Each person is an individual and must determine, by trial, what is best for them. A good practice for all is to drink plenty of water.   Will you be a social outcast?  Have you met anyone who has an ostomy and is a social outcast? Why should you be the first? Only your attitude and self image will effect how you are treated. No confi-dent person is an Occupational psychologist.     PROFESSIONAL HELP  Resources are available if you need help or have questions about your ostomy.    Specially trained nurses called Wound, Ostomy Continence Nurses (WOCN) are available for consultation in most major medical centers.   Consider getting an ostomy consult at one of the outpatient ostomy clinics.  [facility] one in [place] at this time   The [employer] (UOA) is a group made up of many local chapters throughout the [place]. These local groups hold meetings and provide support to prospective and existing ostomates. They sponsor educational events and have qualified visitors to make personal or telephone visits. Contact the UOA for the chapter nearest you and for other educational publications.   More detailed information can be found in Colostomy Guide, a publication of the [employer] (UOA). Contact UOA at 1-581-803-3454 or visit  their web site at https://arellano.com/. The website contains links to other sites, suppliers and resources.   [facility] Start Services:  Start at the website to enlist for support.  Your Wound Ostomy (WOCN) nurse may have started this process. https://www.hollister.com/en/securestart  Secure Start services are designed to support people as they live their lives with an ostomy or neurogenic bladder. Enrolling is easy and at no cost to the patient. We realize that each person's needs and life journey are different. Through Secure Start services, we want to help people live their life, their way.     Colorectal Cancer  Colorectal cancer is an abnormal growth of cells and tissue (tumor) in the colon or rectum, which are parts of the large intestine. The cancer can spread (metastasize) to other parts of the body. What are the causes? Most cases of colorectal cancer start as abnormal growths called polyps on the inner wall of the colon or rectum. Other times, abnormal changes to genes (genetic mutations) can cause cells to form cancer. What increases the risk? You are more likely to develop this condition if:  You are older than age 29.  You have multiple polyps in the colon or rectum.  You have diabetes.  You are African American.  You have a family history of Lynch syndrome.  You have had cancer before.  You have certain hereditary conditions, such as: ? Familial adenomatous polyposis. ? Turcot syndrome. ? Peutz-Jeghers syndrome.  You eat a diet that is high in fat (especially animal fat) and low in fiber, fruits, and vegetables.  You have an inactive (sedentary) lifestyle.  You have an inflammatory bowel disease or Crohn's disease.  You smoke.  You drink alcohol excessively. What are the signs or  symptoms? Early colorectal cancer often does not cause symptoms. As the cancer grows, symptoms may include:  Changes in bowel habits.  Feeling like the bowel does not empty  completely after a bowel movement.  Stools that are narrower than usual.  Blood in the stool.  Diarrhea.  Constipation.  Anemia.  Discomfort, pain, bloating, fullness, or cramps in the abdomen.  Frequent gas pain.  Unexplained weight loss.  Constant fatigue.  Nausea and vomiting. How is this diagnosed? This condition may be diagnosed with:  A medical history.  A physical exam.  Tests. These may include: ? A digital rectal exam. ? A stool test called a fecal occult blood test. ? Blood tests. ? A test in which a tissue sample is taken from the colon or rectum and examined under a microscope (biopsy). ? Imaging tests, such as:  X-rays.  A barium enema.  CT scans.  MRIs.  A sigmoidoscopy. This test is done to view the inside of the rectum.  A colonoscopy. This test is done to view the inside of the colon. During this test, small polyps can be removed or biopsies may be taken.  An endorectal ultrasound. This test checks how deep a tumor in the rectum has grown and whether the cancer has spread to lymph nodes or other nearby tissues. Your health care provider may order additional tests to find out whether the cancer has spread to other parts of the body (what stage it is). The stages of cancer include:  Stage 0. At this stage, the cancer is found only in the innermost lining of the colon or rectum.  Stage I. At this stage, the cancer has grown into the inner wall of the colon or rectum.  Stage II. At this stage, the cancer has gone more deeply into the wall of the colon or rectum or through the wall. It may have invaded nearby tissue.  Stage III. At this stage, the cancer has spread to nearby lymph nodes.  Stage IV. At this stage, the cancer has spread to other parts of the body, such as the liver or lungs. How is this treated? Treatment for this condition depends on the type and stage of the cancer. Treatment may include:  Surgery. In the early stages of the  cancer, surgery may be done to remove polyps or small tumors from the colon. In later stages, surgery may be done to remove part of the colon.  Chemotherapy. This treatment uses medicines to kill cancer cells.  Targeted therapy. This treatment targets specific gene mutations or proteins that the cancer expresses in order to kill tumor cells.  Radiation therapy. This treatment uses radiation to kill cancer cells or shrink tumors.  Radiofrequency ablation. This treatment uses radio waves to destroy the tumors that may have spread to other areas of the body, such as the liver. Follow these instructions at home:  Take over-the-counter and prescription medicines only as told by your health care provider.  Try to eat regular, healthy meals. Some of your treatments might affect your appetite. If you are having problems eating or with your appetite, ask to meet with a food and nutrition specialist (dietitian).  Consider joining a support group. This may help you learn about your diagnosis and cope with the stress of having colorectal cancer.  If you are admitted to the hospital, inform your cancer care team.  Keep all follow-up visits as told by your health care provider. This is important. How is this prevented?  Colorectal  cancer can be prevented with screening tests that find polyps so they can be removed before they develop into cancer.  All adults should have screening for colorectal cancer starting at age 80 and continuing until age 45. Your health care provider may recommend screening at age 83. People at increased risk should start screening at an earlier age. Where to find more information  American Cancer Society: https://www.cancer.[place] ([place]): https://www.cancer.gov Contact a health care provider if:  Your diarrhea or constipation does not go away.  You have blood in your stool.  Your bowel habits change.  You have increased pain in your  abdomen.  You notice new fatigue or weakness.  You lose weight. Get help right away if:  You have increased bleeding from your rectum.  You have any uncontrollable or severe abdomen (abdominal) symptoms. Summary  Colorectal cancer is an abnormal growth of cells and tissue (tumor) in the colon or rectum.  Common risk factors for this condition include having a relative with colon cancer, being older in age, having an inflammatory bowel disease, and being African American.  This condition may be diagnosed with tests, such as a colonoscopy and biopsy.  Treatment depends on the type and stage of the cancer. Commonly, treatment includes surgery to remove the tumor along with chemotherapy or targeted therapy.  Keep all follow-up visits as told by your health care provider. This is important. This information is not intended to replace advice given to you by your health care provider. Make sure you discuss any questions you have with your health care provider. Document Released: 11/29/2005 Document Revised: 01/19/2018 Document Reviewed: 12/31/2016 Elsevier Patient Education  2020 [employer].

## 2019-10-22 NOTE — Consult Note (Addendum)
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  He states his family members or a nurse he pays changes his pouch 2 times a week, since he "is unable to move his right hand well enough to apply a pouch." He states that he is independent with emptying when at home. He had hernia repair and colostomy revision on 11/5.  Requested to assess pouching needs after surgery.   Stoma type/location:  Stoma is red and viable, 1 3/4 inches, slightly above skin level.  There are sutures surrounding the peristomal area where it was decreased in size, and the surrounding skin is edematous and slightly raised. Peristomal assessment:  Intact skin surrounding Output: small amt liquid brown stool  Ostomy pouching: Pt states he used a clear one piece pouch prior to admission.  He will be able to continue to use this pouching system. They were cutting a one inch opening.  Discussed that now the opening will be 1 3/4 inches, and he may want to use barrier rings surrounding the stoma to attempt to maintain a seal; demonstrated how to use. 5 extra sets of one piece pouches and barrier rings left in the room.  Pt is already established with a pouch ordering process.  Julien Girt MSN, RN, [place], [place], [place]

## 2019-10-22 NOTE — Progress Notes (Signed)
Physical Therapy Treatment Patient Details Name: Shawn Chambers MRN: [id number] DOB: 11/08/1941 Today's Date: 10/22/2019    History of Present Illness Pt is 78 y/o M with hx: rectal cancer, now s/p robotic LAR, colostomy revision, parastomal hernia repair.    PT Comments    Pt has made excellent progress with mobility. Pt has met all PT goals of Mod Indep and is safe to ambulate on the unit without assistance until d/c home. Pt does not have any further acute PT needs and no longer needs a RW. Pt agreeable to ambulate on the unit for improved endurance. Pt is d/c from PT services.  Follow Up Recommendations  No PT follow up     Equipment Recommendations  None recommended by PT    Recommendations for Other Services       Precautions / Restrictions Precautions Precautions: Fall Precaution Comments: drain Restrictions Weight Bearing Restrictions: No    Mobility  Bed Mobility Overal bed mobility: Modified Independent Bed Mobility: Supine to Sit     Supine to sit: Modified independent (Device/Increase time);HOB elevated     General bed mobility comments: use of bad rails  Transfers Overall transfer level: Modified independent Equipment used: None Transfers: Sit to/from Stand Sit to Stand: Modified independent (Device/Increase time)            Ambulation/Gait Ambulation/Gait assistance: Modified independent (Device/Increase time) Gait Distance (Feet): 300 Feet Assistive device: None Gait Pattern/deviations: Step-through pattern;Decreased stride length Gait velocity: decreased   General Gait Details: no LOB noted with head turns, sudden stops, or speed. Pt is mod. Indep due to increased time not due to needing an assistive device.   Stairs             Wheelchair Mobility    Modified Rankin (Stroke Patients Only)       Balance Overall balance assessment: No apparent balance deficits (not formally assessed)                                           Cognition Arousal/Alertness: Awake/alert Behavior During Therapy: WFL for tasks assessed/performed Overall Cognitive Status: Within Functional Limits for tasks assessed                                 General Comments: HOH      Exercises Other Exercises Other Exercises: OT facilitates education re: modified technique for LB dressing with pt demonstrating good understanding.    General Comments General comments (skin integrity, edema, etc.): pt demonstrated good safe technique with mobility. Recommend ambulation on the unit until medically d/c home.      Pertinent Vitals/Pain Pain Assessment: No/denies pain Pain Score: 4  Pain Location: abdomen, only with bending Pain Descriptors / Indicators: Discomfort Pain Intervention(s): Monitored during session    Home Living                      Prior Function            PT Goals (current goals can now be found in the care plan section) Acute Rehab PT Goals Patient Stated Goal: To return home Progress towards PT goals: Goals met/education completed, patient discharged from PT    Frequency           PT Plan Equipment recommendations need to be updated;Discharge plan needs to  be updated    Co-evaluation              AM-PAC PT "6 Clicks" Mobility   Outcome Measure  Help needed turning from your back to your side while in a flat bed without using bedrails?: A Little Help needed moving from lying on your back to sitting on the side of a flat bed without using bedrails?: A Little Help needed moving to and from a bed to a chair (including a wheelchair)?: None Help needed standing up from a chair using your arms (e.g., wheelchair or bedside chair)?: None Help needed to walk in hospital room?: None Help needed climbing 3-5 steps with a railing? : A Little 6 Click Score: 21    End of Session   Activity Tolerance: Patient tolerated treatment well Patient left: in chair;with call  bell/phone within reach Nurse Communication: Mobility status(mod. Indep) PT Visit Diagnosis: Other abnormalities of gait and mobility (R26.89)     Time: 1438-8875 PT Time Calculation (min) (ACUTE ONLY): 19 min  Charges:  $Gait Training: 8-22 mins                      Theodoro Grist Kerstine 10/22/2019, 1:16 PM

## 2019-10-22 NOTE — TOC Progression Note (Signed)
Transition of Care [facility]) - Progression Note    Patient Details  Name: [name] MRN: [id number] Date of Birth: 1941-06-28  [facility] CM/SW Contact  Joaquin Courts, RN Phone Number: 10/22/2019, 1:59 PM  Clinical Narrative:    CM spoke with patient who reports he lives at home alone but his children check on him daily. CM discussed with the patient recommendation for HHPT. Patient is open to the idea but requests that CM discuss with his daughter as well.  CM called and spoke with patient's daughter. Patient set up with [facility] for [place] services.     Expected Discharge Plan: [place] Barriers to Discharge: Continued Medical Work up  Expected Discharge Plan and Services Expected Discharge Plan: [place]   Discharge Planning Services: CM Consult Post Acute Care Choice: [place] arrangements for the past 2 months: Single Family Home                 DME Arranged: N/A DME Agency: NA       HH Arranged: PT HH Agency: [place] Date [place]: 10/22/19 Time [place]: [id number] Representative spoke with at [place]: [place] ([place]) Interventions    Readmission Risk Interventions No flowsheet data found.

## 2019-10-22 NOTE — Progress Notes (Signed)
4 Days Post-Op Robotic LAR, colostomy revision, parastomal hernia repair Subjective: Doing well, feels sore.  Ambulated in the hall.  Tolerating fulls and having some ostomy output  Objective: Vital signs in last 24 hours: Temp:  [98 F (36.7 C)-99.4 F (37.4 C)] 99.4 F (37.4 C) (11/09 0505) Pulse Rate:  [93-105] 95 (11/09 0505) Resp:  [16-18] 18 (11/09 0505) BP: (97-126)/(58-80) 126/72 (11/09 0505) SpO2:  [91 %-93 %] 91 % (11/09 0505) Weight:  [84.8 kg] 84.8 kg (11/09 0505)   Intake/Output from previous day: 11/08 0701 - 11/09 0700 In: 600 [P.O.:600] Out: 430 [Urine:150; Drains:30; Stool:250] Intake/Output this shift: Total I/O In: 120 [P.O.:120] Out: 105 [Drains:5; Stool:100]   General appearance: alert and cooperative GI: soft, distended Ostomy: edematous Incision: no significant drainage  Lab Results:  Recent Labs    10/20/19 0349 10/21/19 0329  WBC 10.6* 11.5*  HGB 11.9* 12.0*  HCT 36.9* 36.7*  PLT 144* 148*   BMET Recent Labs    10/20/19 0349 10/21/19 0329 10/22/19 0235  NA 135 135  --   K 4.4 4.3 3.6  CL 100 98  --   CO2 25 26  --   GLUCOSE 115* 151*  --   BUN 15 19  --   CREATININE 1.32* 1.30* 1.42*  CALCIUM 9.6 9.7  --    PT/INR No results for input(s): LABPROT, INR in the last 72 hours. ABG No results for input(s): PHART, HCO3 in the last 72 hours.  Invalid input(s): PCO2, PO2  MEDS, Scheduled . acetaminophen  1,000 mg Oral Q6H  . alvimopan  12 mg Oral BID  . Chlorhexidine Gluconate Cloth  6 each Topical Daily  . enoxaparin (LOVENOX) injection  40 mg Subcutaneous Q24H  . feeding supplement  237 mL Oral BID BM  . gabapentin  300 mg Oral BID  . insulin aspart  0-15 Units Subcutaneous TID WC  . insulin aspart  0-5 Units Subcutaneous QHS  . latanoprost  1 drop Both Eyes QHS  . lip balm  1 application Topical BID  . psyllium  1 packet Oral BID  . saccharomyces boulardii  250 mg Oral BID  . sodium chloride flush  3 mL Intravenous Q12H   . tamsulosin  0.8 mg Oral QHS    Studies/Results: No results found.  Assessment: s/p Procedure(s): XI ROBOTIC ASSISTED LOWER ANTERIOR RESECTION WITH PERISTOMAL HERNIA REPAIR WITH MESH ( SUGAR BAKER) OSTOMY REVISION CYSTOSCOPY WITH FIREFLY INJECTION Patient Active Problem List   Diagnosis Date Noted  . Hyperglycemia 10/21/2019  . History of Noncompliance with treatment plan 10/20/2019  . 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 Anemia of chronic disease 10/20/2019  . HOH (hard of hearing) 10/20/2019  . History of seizures   . Glaucoma   . BPH (benign prostatic hyperplasia)   . 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 Rectal cancer s/p robotic LAR rectosigmoid resction 10/19/2019 10/18/2019  . 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 GERD (gastroesophageal reflux disease) 03/28/2017  . 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 S/P partial colectomy 03/28/2017    Expected post op course  Plan: Advance diet to soft diet Cont to ambulate PT/OT   LOS: 4 days     .Rosario Adie, [facility], [place]    10/22/2019 12:07 PM

## 2019-10-23 LAB — GLUCOSE, CAPILLARY
Glucose-Capillary: 119 mg/dL — ABNORMAL HIGH (ref 70–99)
Glucose-Capillary: 116 mg/dL — ABNORMAL HIGH (ref 70–99)
Glucose-Capillary: 155 mg/dL — ABNORMAL HIGH (ref 70–99)

## 2019-10-23 NOTE — Progress Notes (Signed)
Discharge instructions discussed with patient, verbalized agreement and understanding, 

## 2019-10-23 NOTE — Discharge Summary (Signed)
Physician Discharge Summary    Patient ID: Shawn Chambers MRN: [id number] DOB/AGE: 01-21-1941  78 y.o.  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 10/19/2019   History of seizures   Glaucoma   BPH (benign prostatic hyperplasia)   CKD (chronic kidney disease) stage 3, GFR 30-59 ml/min   Anemia of chronic disease   HOH (hard of hearing)   Hyperglycemia   5 Days Post-Op  10/18/2019  POST-OPERATIVE DIAGNOSIS:  RECTAL CANCER PARASTOMAL HERNIA WITH COLOSTOMY PROLAPSE  PROCEDURE:  XI ROBOTIC ASSISTED LOWER ANTERIOR RESECTION PARASTOMAL HERNIA REPAIR WITH PHASIX MESH (SUGAR-BAKER), OSTOMY REVISION CYSTOSCOPY WITH FIREFLY INJECTION  Surgeon(s): Leighton Ruff, MD   Consults: PT/OT/WOCN  Hospital Course:   Patient with history of prior emergency colectomy for obstructing splenic flexure cancer including a left nephrectomy.  Transverse colon colostomy.  Developed parastomal hernia with prolapse.  Was found to have a rectal tumor as well.  Underwent chemoradiation therapy.  Eventually conceded to surgery.  Underwent robotic low anterior resection.  Underwent colostomy revision with repair of parastomal hernia.  Postoperatively, the patient gradually mobilized and advanced to a solid diet.  Pain and other symptoms were treated aggressively.    By the time of discharge, the patient was walking well the hallways, eating food, having flatus.  Pain  was well-controlled on an oral medications.  It was felt he would benefit from with a walker and therapy evaluation at home.  Home health set up.  Drain removed prior to discharge.  Based on meeting discharge criteria and continuing to recover, I felt it was safe for the patient to be discharged from the hospital to further recover with close followup. Postoperative recommendations were discussed in detail.  They are written as well.  Discharged Condition: good  Discharge Exam: Blood pressure 128/82, pulse 89, temperature 99.5 F (37.5 C), temperature source Oral, resp. rate 18, height 5\' 10"  (1.778 m), weight 84.8 kg, SpO2 94 %.  General: Pt awake/alert/oriented x4 in No acute distress Eyes: PERRL, normal EOM.  Sclera clear.  No icterus Neuro: CN II-XII intact w/o focal sensory/motor deficits. Lymph: No head/neck/groin lymphadenopathy Psych:  No delerium/psychosis/paranoia HENT: Normocephalic, Mucus membranes moist.  No thrush Neck: Supple, No tracheal deviation Chest: No chest wall pain w good excursion CV:  Pulses intact.  Regular rhythm MS: Normal AROM mjr joints.  No obvious deformity Abdomen: Obese but Soft.  Nondistended.  Nontender.  Left upper quadrant colostomy in place with pink mucosa and gas and stool in bag.  No inflammation/cellulitis.  No obvious prolapse nor hernia.  No evidence of peritonitis.  No incarcerated hernias. Ext:  SCDs BLE.  No mjr edema.  No cyanosis Skin: No petechiae / purpura   Disposition:   Follow-up Information    Leighton Ruff, MD. Schedule an appointment as soon as possible for a visit in 2 week(s).   Specialty: General Surgery Contact information: [place] [address] [phone]  Discharge disposition: 01-Home or Self Care       Discharge Instructions    Call MD for:   Complete by: As directed    FEVER > 101.5 F  (temperatures < 101.5 F are not significant)   Call MD for:  extreme fatigue    Complete by: As directed    Call MD for:  persistant dizziness or light-headedness   Complete by: As directed    Call MD for:  persistant nausea and vomiting   Complete by: As directed    Call MD for:  redness, tenderness, or signs of infection (pain, swelling, redness, odor or green/yellow discharge around incision site)   Complete by: As directed    Call MD for:  severe uncontrolled pain   Complete by: As directed    Diet - low sodium heart healthy   Complete by: As directed    Start with a bland diet such as soups, liquids, starchy foods, low fat foods, etc. the first few days at home. Gradually advance to a solid, low-fat, high fiber diet by the end of the first week at home.   Add a fiber supplement to your diet (Metamucil, etc) If you feel full, bloated, or constipated, stay on a full liquid or pureed/blenderized diet for a few days until you feel better and are no longer constipated.   Discharge instructions   Complete by: As directed    See Discharge Instructions If you are not getting better after two weeks or are noticing you are getting worse, contact our office [phone] for further advice.  We may need to adjust your medications, re-evaluate you in the office, send you to the emergency room, or see what other things we can do to help. The clinic staff is available to answer your questions during regular business hours (8:30am-5pm).  Please don't hesitate to call and ask to speak to one of our nurses for clinical concerns.    A surgeon from [facility] is always on call at the hospitals 24 hours/day If you have a medical emergency, go to the nearest emergency room or call 911.   Discharge wound care:   Complete by: As directed    It is good for closed incisions and even open wounds to be washed every day.  Shower every day.  Short baths are fine.  Wash the incisions and wounds clean with soap & water.    You may leave closed incisions open to air if it is dry.    You may cover the incision with clean gauze & replace it after your daily shower for comfort.  DERMABOND:  You have purple skin glue (Dermabond) on your incision(s).  Leave them in place, and they will fall off on their own like a scab.  You may trim any edges that curl up with clean scissors.   Driving Restrictions   Complete by: As directed    You may drive when: - you are no longer taking narcotic prescription pain medication - you can comfortably wear a seatbelt - you can safely make sudden turns/stops without pain.   Increase activity slowly   Complete by: As directed    Start light daily activities --- self-care, walking, climbing stairs- beginning the day after surgery.  Gradually increase activities as tolerated.  Control your pain to be active.  Stop when you are tired.  Ideally, walk several times a day, eventually an hour a day.   Most people are back to most day-to-day  activities in a few weeks.  It takes 4-6 weeks to get back to unrestricted, intense activity. If you can walk 30 minutes without difficulty, it is safe to try more intense activity such as jogging, treadmill, bicycling, low-impact aerobics, swimming, etc. Save the most intensive and strenuous activity for last (Usually 4-8 weeks after surgery) such as sit-ups, heavy lifting, contact sports, etc.  Refrain from any intense heavy lifting or straining until you are off narcotics for pain control.  You will have off days, but things should improve week-by-week. DO NOT PUSH THROUGH PAIN.  Let pain be your guide: If it hurts to do something, don't do it.   Lifting restrictions   Complete by: As directed    If you can walk 30 minutes without difficulty, it is safe to try more intense activity such as jogging, treadmill, bicycling, low-impact aerobics, swimming, etc. Save the most intensive and strenuous activity for last (Usually 4-8 weeks after surgery) such as sit-ups, heavy lifting, contact sports, etc.   Refrain from any  intense heavy lifting or straining until you are off narcotics for pain control.  You will have off days, but things should improve week-by-week. DO NOT PUSH THROUGH PAIN.  Let pain be your guide: If it hurts to do something, don't do it.  Pain is your body warning you to avoid that activity for another week until the pain goes down.   May shower / Bathe   Complete by: As directed    May walk up steps   Complete by: As directed    Sexual Activity Restrictions   Complete by: As directed    You may have sexual intercourse when it is comfortable. If it hurts to do something, stop.      Allergies as of 10/23/2019   No Known Allergies     Medication List    TAKE these medications   latanoprost 0.005 % ophthalmic solution Commonly known as: XALATAN Place 1 drop into both eyes at bedtime.   phenazopyridine 200 MG tablet Commonly known as: Pyridium Take 1 tablet (200 mg total) by mouth 3 (three) times daily as needed for pain.   tamsulosin 0.4 MG Caps capsule Commonly known as: FLOMAX Take 0.8 mg by mouth at bedtime.   traMADol 50 MG tablet Commonly known as: ULTRAM Take 1 tablet (50 mg total) by mouth every 6 (six) hours as needed (mild pain).            Durable Medical Equipment  (From admission, onward)         Start     Ordered   10/20/19 0945  DME Walker rolling  Once    Comments: To help patient transfer and ambulate.  Physical / Occupational Therapy may change type of walker PRN.  Question:  Patient needs a walker to treat with the following condition  Answer:  Balance problem   10/20/19 0944           Discharge Care Instructions  (From admission, onward)         Start     Ordered   10/23/19 0000  Discharge wound care:    Comments: It is good for closed incisions and even open wounds to be washed every day.  Shower every day.  Short baths are fine.  Wash the incisions and wounds clean with soap & water.    You may leave closed incisions open to air if  it is dry.   You may cover the incision with clean  gauze & replace it after your daily shower for comfort.  DERMABOND:  You have purple skin glue (Dermabond) on your incision(s).  Leave them in place, and they will fall off on their own like a scab.  You may trim any edges that curl up with clean scissors.   10/23/19 0818          Significant Diagnostic Studies:  Results for orders placed or performed during the hospital encounter of 10/18/19 (from the past 72 hour(s))  Prealbumin - add on     Status: Abnormal   Collection Time: 10/20/19  9:12 AM  Result Value Ref Range   Prealbumin 17.6 (L) 18 - 38 mg/dL    Comment: Performed at [facility], [place] [address] [place], [place] [address]  CBC     Status: Abnormal   Collection Time: 10/21/19  3:29 AM  Result Value Ref Range   WBC 11.5 (H) 4.0 - 10.5 K/uL   RBC 3.99 (L) 4.22 - 5.81 MIL/uL   Hemoglobin 12.0 (L) 13.0 - 17.0 g/dL   HCT 36.7 (L) 39.0 - 52.0 %   MCV 92.0 80.0 - 100.0 fL   MCH 30.1 26.0 - 34.0 pg   MCHC 32.7 30.0 - 36.0 g/dL   RDW 13.2 11.5 - 15.5 %   Platelets 148 (L) 150 - 400 K/uL   nRBC 0.0 0.0 - 0.2 %    Comment: Performed at [facility], [place] [address] [place], [place] [id number]  Basic metabolic panel     Status: Abnormal   Collection Time: 10/21/19  3:29 AM  Result Value Ref Range   Sodium 135 135 - 145 mmol/L   Potassium 4.3 3.5 - 5.1 mmol/L   Chloride 98 98 - 111 mmol/L   CO2 26 22 - 32 mmol/L   Glucose, Bld 151 (H) 70 - 99 mg/dL   BUN 19 8 - 23 mg/dL   Creatinine, Ser 1.30 (H) 0.61 - 1.24 mg/dL   Calcium 9.7 8.9 - 10.3 mg/dL   GFR calc non Af Amer 52 (L) >60 mL/min   GFR calc Af Amer >60 >60 mL/min   Anion gap 11 5 - 15    Comment: Performed at [facility], [place] [address] [place], [place] [address]  Glucose, capillary     Status: Abnormal   Collection Time: 10/21/19 11:55 AM  Result Value Ref Range   Glucose-Capillary 146 (H) 70 - 99 mg/dL   Glucose, capillary     Status: Abnormal   Collection Time: 10/21/19  4:49 PM  Result Value Ref Range   Glucose-Capillary 165 (H) 70 - 99 mg/dL  Glucose, capillary     Status: Abnormal   Collection Time: 10/21/19  9:52 PM  Result Value Ref Range   Glucose-Capillary 183 (H) 70 - 99 mg/dL  Hemoglobin A1c     Status: Abnormal   Collection Time: 10/22/19  2:35 AM  Result Value Ref Range   Hgb A1c MFr Bld 5.7 (H) 4.8 - 5.6 %    Comment: (NOTE) Pre diabetes:          5.7%-6.4% Diabetes:              >6.4% Glycemic control for   <7.0% adults with diabetes    Mean Plasma Glucose 116.89 mg/dL    Comment: Performed at [place] [address] [place],  [address]  Creatinine in AM     Status: Abnormal   Collection Time:  10/22/19  2:35 AM  Result Value Ref Range   Creatinine, Ser 1.42 (H) 0.61 - 1.24 mg/dL   GFR calc non Af Amer 47 (L) >60 mL/min   GFR calc Af Amer 54 (L) >60 mL/min    Comment: Performed at [facility], [place] [address], [place], [place] [address]  Potassium in AM     Status: None   Collection Time: 10/22/19  2:35 AM  Result Value Ref Range   Potassium 3.6 3.5 - 5.1 mmol/L    Comment: Performed at [facility], [place] [address], [place], [place] [address]  Glucose, capillary     Status: Abnormal   Collection Time: 10/22/19  7:45 AM  Result Value Ref Range   Glucose-Capillary 138 (H) 70 - 99 mg/dL  Glucose, capillary     Status: Abnormal   Collection Time: 10/22/19 11:57 AM  Result Value Ref Range   Glucose-Capillary 158 (H) 70 - 99 mg/dL  Glucose, capillary     Status: Abnormal   Collection Time: 10/22/19  5:31 PM  Result Value Ref Range   Glucose-Capillary 146 (H) 70 - 99 mg/dL  Glucose, capillary     Status: Abnormal   Collection Time: 10/22/19 10:26 PM  Result Value Ref Range   Glucose-Capillary 119 (H) 70 - 99 mg/dL    No results found.  Past Medical History:  Diagnosis Date  . Arthritis    hand,  knee- ? Dx.  Marland Kitchen BPH (benign prostatic hyperplasia)   . 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 Cataract    removed  . GERD (gastroesophageal reflux disease)   . Glaucoma   . Hearing loss   . Rectal adenocarcinoma ([place])   . Renal cancer ([place])   . Seizures ([place])    resolved- daughter states she didnt know he ever had seizures    Past Surgical History:  Procedure Laterality Date  . APPENDECTOMY    . COLONOSCOPY    . COLOSTOMY REVISION N/A 10/18/2019   Procedure: OSTOMY REVISION;  Surgeon: Leighton Ruff, MD;  Location: [facility];  Service: General;  Laterality: N/A;  . CYSTOSCOPY WITH STENT PLACEMENT Bilateral 10/18/2019   Procedure: CYSTOSCOPY WITH FIREFLY INJECTION;  Surgeon: Cleon Gustin, MD;  Location: [facility];  Service: Urology;  Laterality: Bilateral;  . PARTIAL COLECTOMY N/A 03/28/2017   Procedure: PARTIAL COLECTOMY/COLOSTOMY;  Surgeon: Georganna Skeans, MD;  Location: [place];  Service: General;  Laterality: N/A;  Partial Colectomy, Mobilization of Splenic Flexure, Colostomy  . PARTIAL NEPHRECTOMY Left 03/28/2017   Procedure: NEPHRECTOMY TOTAL;  Surgeon: Raynelle Bring, MD;  Location: [facility];  Service: Urology;  Laterality: Left;  . XI ROBOTIC ASSISTED LOWER ANTERIOR RESECTION N/A 10/18/2019   Procedure: XI ROBOTIC ASSISTED LOWER ANTERIOR RESECTION WITH PERISTOMAL HERNIA REPAIR WITH MESH ( SUGAR BAKER);  Surgeon: Leighton Ruff, MD;  Location: [facility];  Service: General;  Laterality: N/A;    Social History   Socioeconomic History  . Marital status: Single    Spouse name: Not on file  . Number of children: Not on file  . Years of education: Not on file  . Highest education level: Not on file  Occupational History  . Not on file  Social Needs  . Financial resource strain: Not on file  . Food insecurity    Worry: Not on file    Inability: Not on file  . Transportation needs    Medical: No    Non-medical: No  Tobacco Use  .  Smoking status:  Former Smoker    Packs/day: 1.00    Years: 60.00    Pack years: 60.00    Types: Cigarettes    Quit date: 03/14/2017    Years since quitting: 2.6  . Smokeless tobacco: Never Used  Substance and Sexual Activity  . Alcohol use: No  . Drug use: No  . Sexual activity: Not on file  Lifestyle  . Physical activity    Days per week: Not on file    Minutes per session: Not on file  . Stress: Not on file  Relationships  . Social Herbalist on phone: Not on file    Gets together: Not on file    Attends religious service: Not on file    Active member of club or organization: Not on file    Attends meetings of clubs or organizations: Not on file    Relationship status: Not on file  . Intimate partner violence    Fear of current or ex partner: Not on file    Emotionally abused: Not on file    Physically abused: Not on file    Forced sexual activity: Not on file  Other Topics Concern  . Not on file  Social History Narrative  . Not on file    Family History  Problem Relation Age of Onset  . Esophageal cancer Paternal Uncle   . Colon polyps Daughter   . Colon cancer Neg Hx   . Rectal cancer Neg Hx   . Stomach cancer Neg Hx     Current Facility-Administered Medications  Medication Dose Route Frequency Provider Last Rate Last Dose  . 0.9 %  sodium chloride infusion  250 mL Intravenous PRN Michael Boston, MD      . acetaminophen (TYLENOL) tablet 1,000 mg  1,000 mg Oral [id number] Leighton Ruff, MD   [id number] mg at 10/23/19 0559  . alum & mag hydroxide-simeth (MAALOX/MYLANTA) 200-200-20 MG/5ML suspension 30 mL  30 mL Oral Q6H PRN Michael Boston, MD   30 mL at 10/21/19 0345  . Chlorhexidine Gluconate Cloth 2 % PADS 6 each  6 each Topical Daily Leighton Ruff, MD   6 each at 10/21/19 [phone]  . diphenhydrAMINE (BENADRYL) 12.5 MG/5ML elixir 12.5 mg  12.5 mg Oral [id number] PRN Leighton Ruff, MD       Or  . diphenhydrAMINE (BENADRYL) injection 12.5 mg  12.5 mg Intravenous [id number] PRN Leighton Ruff, MD       . enoxaparin (LOVENOX) injection 40 mg  40 mg Subcutaneous [id number] Leighton Ruff, MD   40 mg at 10/22/19 0819  . feeding supplement (ENSURE SURGERY) liquid 237 mL  237 mL Oral BID BM Leighton Ruff, MD   [id number] mL at 10/22/19 0925  . gabapentin (NEURONTIN) capsule 300 mg  300 mg Oral BID Leighton Ruff, MD   [id number] mg at 10/22/19 2205  . guaiFENesin-dextromethorphan (ROBITUSSIN DM) 100-10 MG/5ML syrup 10 mL  10 mL Oral Q4H PRN Michael Boston, MD      . hydrocortisone (ANUSOL-HC) 2.5 % rectal cream 1 application  1 application Topical QID PRN Michael Boston, MD      . hydrocortisone cream 1 % 1 application  1 application Topical TID PRN Michael Boston, MD      . HYDROmorphone (DILAUDID) injection 0.5 mg  0.5 mg Intravenous [id number] PRN Leighton Ruff, MD      . insulin aspart (novoLOG) injection 0-15 Units  0-15 Units Subcutaneous TID WC Michael Boston, MD   3 Units  at 10/22/19 1209  . insulin aspart (novoLOG) injection 0-5 Units  0-5 Units Subcutaneous QHS Michael Boston, MD      . latanoprost (XALATAN) 0.005 % ophthalmic solution 1 drop  1 drop Both Eyes QHS Leighton Ruff, MD   1 drop at 10/22/19 2206  . lip balm (CARMEX) ointment 1 application  1 application Topical BID Michael Boston, MD   1 application at [phone]  . magic mouthwash  15 mL Oral QID PRN Michael Boston, MD      . menthol-cetylpyridinium (CEPACOL) lozenge 3 mg  1 lozenge Oral PRN Michael Boston, MD      . methocarbamol (ROBAXIN) 1,000 mg in dextrose 5 % 100 mL IVPB  1,000 mg Intravenous Q6H PRN Michael Boston, MD      . ondansetron [facility]) tablet 4 mg  4 mg Oral [id number] PRN Leighton Ruff, MD       Or  . ondansetron [facility]) injection 4 mg  4 mg Intravenous [id number] PRN Leighton Ruff, MD      . phenol (CHLORASEPTIC) mouth spray 1-2 spray  1-2 spray Mouth/Throat PRN Michael Boston, MD      . psyllium (HYDROCIL/METAMUCIL) packet 1 packet  1 packet Oral BID Michael Boston, MD   1 packet at 10/22/19 2207  . saccharomyces boulardii (FLORASTOR) capsule 250  mg  250 mg Oral BID Leighton Ruff, MD   [id number] mg at 10/22/19 2205  . sodium chloride flush (NS) 0.9 % injection 3 mL  3 mL Intravenous Gorden Harms, MD   3 mL at 10/20/19 2140  . sodium chloride flush (NS) 0.9 % injection 3 mL  3 mL Intravenous PRN Michael Boston, MD      . tamsulosin (FLOMAX) capsule 0.8 mg  0.8 mg Oral QHS Leighton Ruff, MD   0.8 mg at 10/20/19 2140  . traMADol (ULTRAM) tablet 50 mg  50 mg Oral [id number] PRN Leighton Ruff, MD   50 mg at 10/19/19 [id number]     No Known Allergies  Signed: Morton Peters, MD, FACS, MASCRS Gastrointestinal and Minimally Invasive Surgery  [facility] [address] [place], [place] [address] [phone] Main / Paging [phone] Fax     10/23/2019, 8:18 AM

## 2019-10-23 NOTE — TOC Transition Note (Signed)
Transition of Care [facility]) - CM/SW Discharge Note   Patient Details  Name: Chang Donald MRN: [id number] Date of Birth: 09-Nov-1941  Transition [facility]) CM/SW Contact:  Nila Nephew, LCSW Phone Number: [phone] 10/23/2019, 11:49 AM   Clinical Narrative:   Home Health care is arranged through [facility]. Spoke with pt about DME, ordered rolling walker.       Barriers to Discharge: Barriers Resolved   Patient Goals and CMS Choice Patient states their goals for this hospitalization and ongoing recovery are:: to go back home CMS Medicare.gov Compare Post Acute Care list provided to:: Patient Choice offered to / list presented to : Patient, Adult Children  Discharge Placement                    Patient and family notified of of transfer: 10/23/19  Discharge Plan and Services   Discharge Planning Services: CM Consult Post Acute Care Choice: Home Health          DME Arranged: Gilford Rile rolling DME Agency: [employer] Date DME Agency Contacted: 10/23/19 Time DME Agency Contacted: [id number] Representative spoke with at DME Agency: [place]: PT [place]: [place] Date [place]: 10/22/19 Time [place]: [id number] Representative spoke with at [place]: [place] ([place]) Interventions     Readmission Risk Interventions No flowsheet data found.

## 2019-10-24 LAB — SURGICAL PATHOLOGY

## 2019-10-25 ENCOUNTER — Telehealth: Payer: Self-pay | Admitting: Oncology

## 2019-10-25 NOTE — Telephone Encounter (Signed)
Returned patient's phone call regarding rescheduling an appointment, per patient's request 11/13 appointment has moved to 11/23.

## 2019-10-26 ENCOUNTER — Ambulatory Visit: Payer: Medicare HMO | Admitting: Oncology

## 2019-11-05 ENCOUNTER — Inpatient Hospital Stay: Payer: Medicare HMO | Attending: Oncology | Admitting: Oncology

## 2019-11-07 ENCOUNTER — Telehealth: Payer: Self-pay | Admitting: *Deleted

## 2019-11-07 ENCOUNTER — Telehealth: Payer: Self-pay | Admitting: Oncology

## 2019-11-07 NOTE — Telephone Encounter (Signed)
Called er 11/25 schedule message, left a voicemail and converted appointment to a virtual visit.

## 2019-11-07 NOTE — Telephone Encounter (Signed)
Scheduled appt per 11/25 sch message for appt on 11/30 - spoke with pt daughter and she is aware of appt. She would like to know if this appt can be done as a virtual visit. - message sent to RN

## 2019-11-07 NOTE — Telephone Encounter (Signed)
Informed daughter, [name] that his appointment with Dr. Benay Spice is to discuss his pathology results and discuss potential additional treatment. MD has 12/3 at 0800 for WebEx appointment. She agrees to this. Scheduling message sent to make change.

## 2019-11-12 ENCOUNTER — Ambulatory Visit: Payer: Medicare HMO | Admitting: Oncology

## 2019-11-13 ENCOUNTER — Telehealth: Payer: Medicare HMO | Admitting: Oncology

## 2019-11-13 NOTE — Progress Notes (Deleted)
[place] OFFICE VISIT PROGRESS NOTE  I connected with@ on 11/13/19 at  8:00 AM EST by {Blank single:19197::"video enabled telemedicine visit","telephone visit"} and verified that I am speaking with the correct person using two identifiers.   I discussed the limitations, risks, security and privacy concerns of performing an evaluation and management service by telemedicine and the availability of in-person appointments. I also discussed with the patient that there may be a patient responsible charge related to this service. The patient expressed understanding and agreed to proceed.  Other persons participating in the visit and their role in the encounter:  ***  Patient's location:  *** Provider's location:  ***  Chief Complaint:  ***  Diagnosis:    INTERVAL HISTORY:   ***  Objective:  Vital signs in last 24 hours:  There were no vitals taken for this visit.    HEENT: *** Lymphatics: *** Resp: *** Cardio: *** GI: *** Vascular: *** Neuro: ***  Skin: ***    Lab Results:  Lab Results  Component Value Date   WBC 11.5 (H) 10/21/2019   HGB 12.0 (L) 10/21/2019   HCT 36.7 (L) 10/21/2019   MCV 92.0 10/21/2019   PLT 148 (L) 10/21/2019   NEUTROABS 4.0 05/14/2019    Imaging:  No results found.  Medications: I have reviewed the patient's current medications.  Assessment/Plan: 1. Colon cancer, descending colon, stage II (T3 N0), status post a partial left colectomy 03/28/2017 ? MSI-stable, no loss of mismatch repair protein expression ? CT abdomen/pelvis 03/26/2017 and CT chest 03/27/2017-no evidence metastatic disease, abdomen/pelvis CT-long segment of bowel wall thickening in the left colon, partial mechanical obstruction by the area of thickening in the left colon, solid enhancing left renal lesion ? Elevated preoperative CEA ? CT 11/15/2018-irregular rectal wall thickening/perirectal stranding, polypoid defect in  the mid transverse colon proximal to the colostomy-indeterminate, nodularity of the prostate with a hyperdense component in the median lobe-stable ? MRI pelvis 02/01/2019-T3BN1 rectal tumor at 4.5 cm from the internal anal sphincter, 11.5 cm in length ? Colonoscopy 02/22/2019-malignant completely obstructing tumor in the proximal rectum. Polyps in the mid and distal rectum; polyps in the ascending, transverse/distal transverse colon. 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 Rectum biopsy shows high-grade dysplasia with focal features suspicious for adenocarcinoma. ? Radiation/Xeloda 03/06/2019-04/12/2019 ? MRI pelvis 08/17/2019- 4.8 cm apple core lesion at the rectosigmoid junction, T3B N0; distance from tumor to the internal anal sphincter is 11.5 cm.   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 BPH  4. History of tobacco use  5.History of an irregular mole at the right upper back  6. Clinical stage III rectal cancer ? Colonoscopy 02/22/2019-malignant completely obstructing tumor in the proximal rectum. Polyps in the mid and distal rectum; polyps in the ascending, transverse/distal transverse colon. 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 Rectum biopsy shows high-grade dysplasia with focal features suspicious for adenocarcinoma. ? Radiation/Xeloda 03/06/2019-04/12/2019 ? Flexible sigmoidoscopy 06/11/2019-infiltrative and ulcerated completely obstructing large mass found in the rectosigmoid colon/very proximal rectum. Mass circumferential. No bleeding present. Lesion now less fungating but still large in size. 25 mm polyp found in the proximal rectum. 12 mm polyp found in the distal rectum.  7. 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 Tumor appears to involve superficial submucosa. Tumor present at cauterized  resection margin (polyp base). No evidence of lymphovascular invasion. Other polyp showing tubulovillous adenoma with focal  high-grade dysplasia.    Disposition:   I discussed the assessment and treatment plan with the patient. The patient was provided an opportunity to ask questions and all were answered. The patient agreed with the plan and demonstrated an understanding of the instructions.   The patient was advised to call back or seek an in-person evaluation if the symptoms worsen or if the condition fails to improve as anticipated.  I provided *** minutes of {Blank single:19197::"face-to-face video visit time","non face-to-face telephone visit time"} during this encounter, and > 50% was spent counseling as documented under my assessment & plan.  Betsy Coder ANP/GNP-BC   11/13/2019 8:03 AM

## 2019-11-14 ENCOUNTER — Telehealth: Payer: Self-pay | Admitting: *Deleted

## 2019-11-14 NOTE — Telephone Encounter (Signed)
Daughter called to requesting changing the "virtual visit" of 12/4 at 0800 to telephone visit instead. Informed her that his appointment was on 12/1 at 0800. She would like to reschedule and is asking if her father needs to be present since he can't hear. Informed her it would be best for him to be present in case Dr. Benay Spice had questions to ask him and she could relay the question and answer back to him if he was with her.

## 2019-11-15 ENCOUNTER — Inpatient Hospital Stay: Payer: Medicare HMO | Attending: Oncology | Admitting: Oncology

## 2019-11-15 DIAGNOSIS — C20 Malignant neoplasm of rectum: Secondary | ICD-10-CM | POA: Diagnosis not present

## 2019-11-15 DIAGNOSIS — R197 Diarrhea, unspecified: Secondary | ICD-10-CM | POA: Insufficient documentation

## 2019-11-15 DIAGNOSIS — C7A8 Other malignant neuroendocrine tumors: Secondary | ICD-10-CM | POA: Insufficient documentation

## 2019-11-15 DIAGNOSIS — Z85038 Personal history of other malignant neoplasm of large intestine: Secondary | ICD-10-CM | POA: Insufficient documentation

## 2019-11-15 DIAGNOSIS — D649 Anemia, unspecified: Secondary | ICD-10-CM | POA: Insufficient documentation

## 2019-11-15 DIAGNOSIS — Z933 Colostomy status: Secondary | ICD-10-CM | POA: Insufficient documentation

## 2019-11-15 DIAGNOSIS — N400 Enlarged prostate without lower urinary tract symptoms: Secondary | ICD-10-CM | POA: Insufficient documentation

## 2019-11-15 DIAGNOSIS — Z87891 Personal history of nicotine dependence: Secondary | ICD-10-CM | POA: Insufficient documentation

## 2019-11-15 DIAGNOSIS — Z905 Acquired absence of kidney: Secondary | ICD-10-CM | POA: Insufficient documentation

## 2019-11-15 NOTE — Progress Notes (Signed)
[place] OFFICE VISIT PROGRESS NOTE  I connected with Shawn Chambers on 11/15/19 at  8:00 AM EST by telephone and verified that I am speaking with the correct person using two identifiers.   I discussed the limitations, risks, security and privacy concerns of performing an evaluation and management service by telemedicine and the availability of in-person appointments. I also discussed with the patient that there may be a patient responsible charge related to this service. The patient expressed understanding and agreed to proceed.  Other persons participating in the visit and their role in the encounter: Daughter  Patient's location: Home Provider's location: Office   Diagnosis: Rectal cancer  INTERVAL HISTORY:   Shawn Chambers is seen today for a telehealth visit per his request.  This is due to the Covid pandemic. He underwent a robotic assisted low anterior resection and parastomal hernia repair by Dr. Marcello Moores on 10/18/2019.  A proximal rectal mass was noted with radiation changes.  No evidence of metastatic disease.  The pathology ([id number]) revealed a moderately differentiated adenocarcinoma of the proximal and mid rectum.  Tumor focally involves the visceral peritoneum and is present at the serosa at the distal resection margin.  Lymphovascular invasion is present.  No perineural invasion.  Multiple tumor deposits.  1 of 22 lymph nodes contained metastatic disease. No loss of mismatch repair protein expression.  Shawn Chambers reports the colostomy is functioning well.  He has urinary hesitancy and burning with urination.  He reports rectal pain.  He is not taking pain medication.  Lab Results:  Lab Results  Component Value Date   WBC 11.5 (H) 10/21/2019   HGB 12.0 (L) 10/21/2019   HCT 36.7 (L) 10/21/2019   MCV 92.0 10/21/2019   PLT 148 (L) 10/21/2019   NEUTROABS 4.0 05/14/2019     Medications: I have reviewed the  patient's current medications.  Assessment/Plan: 1. Colon cancer, descending colon, stage II (T3 N0), status post a partial left colectomy 03/28/2017 ? MSI-stable, no loss of mismatch repair protein expression ? CT abdomen/pelvis 03/26/2017 and CT chest 03/27/2017-no evidence metastatic disease, abdomen/pelvis CT-long segment of bowel wall thickening in the left colon, partial mechanical obstruction by the area of thickening in the left colon, solid enhancing left renal lesion ? Elevated preoperative CEA ? CT 11/15/2018-irregular rectal wall thickening/perirectal stranding, polypoid defect in the mid transverse colon proximal to the colostomy-indeterminate, nodularity of the prostate with a hyperdense component in the median lobe-stable ? MRI pelvis 02/01/2019-T3BN1 rectal tumor at 4.5 cm from the internal anal sphincter, 11.5 cm in length ? Colonoscopy 02/22/2019-malignant completely obstructing tumor in the proximal rectum. Polyps in the mid and distal rectum; polyps in the ascending, transverse/distal transverse colon. 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 Rectum biopsy shows high-grade dysplasia with focal features suspicious for adenocarcinoma. ? Radiation/Xeloda 03/06/2019-04/12/2019 ? MRI pelvis 08/17/2019- 4.8 cm apple core lesion at the rectosigmoid junction, T3B N0; distance from tumor to the internal anal sphincter is 11.5 cm.   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 BPH  4. History of tobacco use  5.History of an irregular mole at the right upper back  6. Clinical stage III rectal cancer ? Colonoscopy 02/22/2019-malignant completely obstructing tumor in the proximal rectum. Polyps in the mid and distal rectum; polyps in the ascending, transverse/distal transverse colon. 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 Rectum biopsy shows high-grade dysplasia with focal features suspicious for adenocarcinoma. ?  Radiation/Xeloda 03/06/2019-04/12/2019 ? Flexible  sigmoidoscopy 06/11/2019-infiltrative and ulcerated completely obstructing large mass found in the rectosigmoid colon/very proximal rectum.  Mass circumferential.  No bleeding present.  Lesion now less fungating but still large in size.  25 mm polyp found in the proximal rectum.  12 mm polyp found in the distal rectum. ? Radiation/Xeloda 03/06/2019-04/12/2019 ? MRI pelvis 08/17/2019- 4.8 cm apple core lesion at the rectosigmoid junction, T3B N0; distance from tumor to the internal anal sphincter is 11.5 cm. ? Low anterior resection and repair of parastomal hernia 10/18/2019,pT4pN1a, tumor involves the visceral peritoneum, adenocarcinoma present at the serosa of the distal resection margin, lymphovascular invasion present, 1/22 lymph nodes positive, multiple tumor deposits  7.  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  Tumor appears to involve superficial submucosa. Tumor present at cauterized resection margin (polyp base).  No evidence of lymphovascular invasion.  Other polyp showing tubulovillous adenoma with focal high-grade dysplasia.     Disposition: Shawn Chambers underwent a low anterior resection on 10/18/2019.  He has been diagnosed with locally advanced rectal cancer after undergoing neoadjuvant Xeloda and radiation last spring.  I discussed the prognosis and reviewed the details of the surgical pathology report with Shawn Chambers and his daughter.  He has a significant chance of developing recurrent rectal cancer.  I recommend adjuvant chemotherapy.  I will most likely recommend single agent capecitabine as I think will be difficult for Shawn Chambers to complete a course of oxaliplatin.  There is also a lack of clear benefit with oxaliplatin-based therapy in this setting.  He will return for an office visit on 11/22/2019 to plan adjuvant  therapy.  He plans to see his primary physician today to evaluate the dysuria.  We will check a CBC, chemistry panel, and CEA when he returns on 11/22/2019.   I discussed the assessment and treatment plan with the patient. The patient was provided an opportunity to ask questions and all were answered. The patient agreed with the plan and demonstrated an understanding of the instructions.   The patient was advised to call back or seek an in-person evaluation if the symptoms worsen or if the condition fails to improve as anticipated.  I provided 25 minutes of telephone, chart review, and documentation time  during this encounter, and > 50% was spent counseling as documented under my assessment & plan.  Betsy Coder ANP/GNP-BC   11/15/2019 8:29 AM

## 2019-11-16 ENCOUNTER — Telehealth: Payer: Self-pay | Admitting: Oncology

## 2019-11-16 NOTE — Telephone Encounter (Signed)
Scheduled per los. Called and spoke with patients daughter. Confirmed appt  °

## 2019-11-22 ENCOUNTER — Inpatient Hospital Stay: Payer: Medicare HMO

## 2019-11-22 ENCOUNTER — Inpatient Hospital Stay (HOSPITAL_BASED_OUTPATIENT_CLINIC_OR_DEPARTMENT_OTHER): Payer: Medicare HMO | Admitting: Nurse Practitioner

## 2019-11-22 ENCOUNTER — Encounter: Payer: Self-pay | Admitting: Nurse Practitioner

## 2019-11-22 ENCOUNTER — Other Ambulatory Visit: Payer: Self-pay

## 2019-11-22 ENCOUNTER — Telehealth: Payer: Self-pay | Admitting: Nurse Practitioner

## 2019-11-22 VITALS — BP 131/76 | HR 98 | Temp 98.2°F | Resp 18 | Ht 70.0 in | Wt 171.9 lb

## 2019-11-22 DIAGNOSIS — Z87891 Personal history of nicotine dependence: Secondary | ICD-10-CM | POA: Diagnosis not present

## 2019-11-22 DIAGNOSIS — C7A8 Other malignant neuroendocrine tumors: Secondary | ICD-10-CM | POA: Diagnosis present

## 2019-11-22 DIAGNOSIS — C20 Malignant neoplasm of rectum: Secondary | ICD-10-CM

## 2019-11-22 DIAGNOSIS — R197 Diarrhea, unspecified: Secondary | ICD-10-CM | POA: Diagnosis not present

## 2019-11-22 DIAGNOSIS — Z933 Colostomy status: Secondary | ICD-10-CM | POA: Diagnosis not present

## 2019-11-22 DIAGNOSIS — D649 Anemia, unspecified: Secondary | ICD-10-CM | POA: Diagnosis not present

## 2019-11-22 DIAGNOSIS — N400 Enlarged prostate without lower urinary tract symptoms: Secondary | ICD-10-CM | POA: Diagnosis not present

## 2019-11-22 DIAGNOSIS — Z905 Acquired absence of kidney: Secondary | ICD-10-CM | POA: Diagnosis not present

## 2019-11-22 DIAGNOSIS — Z85038 Personal history of other malignant neoplasm of large intestine: Secondary | ICD-10-CM | POA: Diagnosis not present

## 2019-11-22 LAB — CBC WITH DIFFERENTIAL (CANCER CENTER ONLY)
Abs Immature Granulocytes: 0.03 10*3/uL (ref 0.00–0.07)
Basophils Absolute: 0 10*3/uL (ref 0.0–0.1)
Basophils Relative: 0 %
Eosinophils Absolute: 0.2 10*3/uL (ref 0.0–0.5)
Eosinophils Relative: 2 %
HCT: 27.9 % — ABNORMAL LOW (ref 39.0–52.0)
Hemoglobin: 8.9 g/dL — ABNORMAL LOW (ref 13.0–17.0)
Immature Granulocytes: 0 %
Lymphocytes Relative: 8 %
Lymphs Abs: 0.6 10*3/uL — ABNORMAL LOW (ref 0.7–4.0)
MCH: 28.1 pg (ref 26.0–34.0)
MCHC: 31.9 g/dL (ref 30.0–36.0)
MCV: 88 fL (ref 80.0–100.0)
Monocytes Absolute: 0.7 10*3/uL (ref 0.1–1.0)
Monocytes Relative: 10 %
Neutro Abs: 5.9 10*3/uL (ref 1.7–7.7)
Neutrophils Relative %: 80 %
Platelet Count: 318 10*3/uL (ref 150–400)
RBC: 3.17 MIL/uL — ABNORMAL LOW (ref 4.22–5.81)
RDW: 14 % (ref 11.5–15.5)
WBC Count: 7.3 10*3/uL (ref 4.0–10.5)
nRBC: 0 % (ref 0.0–0.2)

## 2019-11-22 LAB — URINALYSIS, COMPLETE (UACMP) WITH MICROSCOPIC
Bilirubin Urine: NEGATIVE
Glucose, UA: NEGATIVE mg/dL
Hgb urine dipstick: NEGATIVE
Ketones, ur: NEGATIVE mg/dL
Leukocytes,Ua: NEGATIVE
Nitrite: NEGATIVE
Protein, ur: NEGATIVE mg/dL
Specific Gravity, Urine: 1.008 (ref 1.005–1.030)
pH: 6 (ref 5.0–8.0)

## 2019-11-22 LAB — CMP (CANCER CENTER ONLY)
ALT: 13 U/L (ref 0–44)
AST: 13 U/L — ABNORMAL LOW (ref 15–41)
Albumin: 3 g/dL — ABNORMAL LOW (ref 3.5–5.0)
Alkaline Phosphatase: 86 U/L (ref 38–126)
Anion gap: 10 (ref 5–15)
BUN: 22 mg/dL (ref 8–23)
CO2: 28 mmol/L (ref 22–32)
Calcium: 9.4 mg/dL (ref 8.9–10.3)
Chloride: 102 mmol/L (ref 98–111)
Creatinine: 1.29 mg/dL — ABNORMAL HIGH (ref 0.61–1.24)
GFR, Est AFR Am: >60 mL/min (ref >60)
GFR, Estimated: 52 mL/min — ABNORMAL LOW (ref >60)
Glucose, Bld: 181 mg/dL — ABNORMAL HIGH (ref 70–99)
Potassium: 4.5 mmol/L (ref 3.5–5.1)
Sodium: 140 mmol/L (ref 135–145)
Total Bilirubin: 0.3 mg/dL (ref 0.3–1.2)
Total Protein: 7.1 g/dL (ref 6.5–8.1)

## 2019-11-22 LAB — CEA (IN HOUSE-CHCC): CEA (CHCC-In House): 10.49 ng/mL — ABNORMAL HIGH (ref 0.00–5.00)

## 2019-11-22 NOTE — Telephone Encounter (Signed)
Scheduled per los. Called and spoke with patients daughter. Confirmed appt  °

## 2019-11-22 NOTE — Progress Notes (Addendum)
[place] OFFICE PROGRESS NOTE   Diagnosis:  Rectal cancer  INTERVAL HISTORY:   Shawn Chambers returns as scheduled.  Overall he "feels good".  He has a good appetite but notes weight loss since surgery.  He gets up frequently during the night to urinate.  He is having occasional discharge from the rectum.  He has itching and burning at the lower legs.  Objective:  Vital signs in last 24 hours:  Blood pressure 131/76, pulse 98, temperature 98.2 F (36.8 C), temperature source Temporal, resp. rate 18, height '5\' 10"'  (1.778 m), weight 171 lb 14.4 oz (78 kg), SpO2 99 %.   Lymphatics: No palpable cervical or supraclavicular lymph nodes. GI: Abdomen soft and nontender.  No hepatomegaly.  Left lower quadrant colostomy. Vascular: No leg edema.  Skin: Faint erythema/dryness at the lower legs.   Lab Results:  Lab Results  Component Value Date   WBC 7.3 11/22/2019   HGB 8.9 (L) 11/22/2019   HCT 27.9 (L) 11/22/2019   MCV 88.0 11/22/2019   PLT 318 11/22/2019   NEUTROABS 5.9 11/22/2019    Imaging:  No results found.  Medications: I have reviewed the patient's current medications.  Assessment/Plan: 1. Colon cancer, descending colon, stage II (T3 N0), status post a partial left colectomy 03/28/2017 ? MSI-stable, no loss of mismatch repair protein expression ? CT abdomen/pelvis 03/26/2017 and CT chest 03/27/2017-no evidence metastatic disease, abdomen/pelvis CT-long segment of bowel wall thickening in the left colon, partial mechanical obstruction by the area of thickening in the left colon, solid enhancing left renal lesion ? Elevated preoperative CEA ? CT 11/15/2018-irregular rectal wall thickening/perirectal stranding, polypoid defect in the mid transverse colon proximal to the colostomy-indeterminate, nodularity of the prostate with a hyperdense component in the median lobe-stable ? MRI pelvis 02/01/2019-T3BN1 rectal tumor at 4.5 cm from the internal anal sphincter,  11.5 cm in length ? Colonoscopy 02/22/2019-malignant completely obstructing tumor in the proximal rectum. Polyps in the mid and distal rectum; polyps in the ascending, transverse/distal transverse colon. 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 Rectum biopsy shows high-grade dysplasia with focal features suspicious for adenocarcinoma. ? Radiation/Xeloda 03/06/2019-04/12/2019 ? MRI pelvis 08/17/2019- 4.8 cm apple core lesion at the rectosigmoid junction, T3B N0; distance from tumor to the internal anal sphincter is 11.5 cm.   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 BPH  4. History of tobacco use  5.History of an irregular mole at the right upper back  6. Clinical stage III rectal cancer ? Colonoscopy 02/22/2019-malignant completely obstructing tumor in the proximal rectum. Polyps in the mid and distal rectum; polyps in the ascending, transverse/distal transverse colon. 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 Rectum biopsy shows high-grade dysplasia with focal features suspicious for adenocarcinoma. ? Radiation/Xeloda 03/06/2019-04/12/2019 ? Flexible sigmoidoscopy 06/11/2019-infiltrative and ulcerated completely obstructing large mass found in the rectosigmoid colon/very proximal rectum. Mass circumferential. No bleeding present. Lesion now less fungating but still large in size. 25 mm polyp found in the proximal rectum. 12 mm polyp found in the distal rectum. ? Radiation/Xeloda 03/06/2019-04/12/2019 ? MRI pelvis 08/17/2019- 4.8 cm apple core lesion at the rectosigmoid junction, T3B N0; distance from tumor to the internal anal sphincter is 11.5 cm. ? Low anterior resection and repair of parastomal hernia 10/18/2019,pT4pN1a, tumor involves the visceral peritoneum, adenocarcinoma present at the serosa of the distal resection margin, lymphovascular invasion present, 1/22 lymph nodes positive, multiple tumor deposits; MSI  stable  7. Rectal polyps 06/11/2019-well-differentiated neuroendocrine tumor arising in  association with a tubulovillous adenoma with focal high-grade dysplasia. Tumor appears to involve superficial submucosa. Tumor present at cauterized resection margin (polyp base). No evidence of lymphovascular invasion. Other polyp showing tubulovillous adenoma with focal high-grade dysplasia.     Disposition: Shawn Chambers appears stable.  We again reviewed the pathology from the 10/18/2019 surgery.  He understands there is a significant chance of developing recurrent rectal cancer and the recommendation is to proceed with adjuvant chemotherapy with Xeloda.  We reviewed potential toxicities including mouth sores, diarrhea, hand-foot syndrome, skin rash.  He is undecided as to whether he will proceed with Xeloda.  He will let us know his decision.  We scheduled a return visit with labs on 12/13/2019.  We will adjust accordingly pending his decision on Xeloda.  Patient seen with Dr. Benay Spice.  25 minutes were spent face-to-face at today's visit with the majority of that time involved in counseling/coordination of care.  Daughter Shawn Chambers was contacted by phone to review the above.    [name] ANP/GNP-BC   11/22/2019  9:55 AM This was a shared visit with [name].  Shawn Chambers appears well today.  We again reviewed the surgical pathology report and adjuvant treatment options with him.  He understands the high risk of developing local and systemic recurrence of rectal cancer over the next 1-2 years.  I recommend adjuvant capecitabine.  We reviewed potential toxicities associated with capecitabine.  I recommend beginning treatment next week.  Shawn Chambers will not agree to adjuvant therapy at present.  Shawn Manson, MD

## 2019-11-26 ENCOUNTER — Telehealth: Payer: Self-pay

## 2019-11-26 NOTE — Telephone Encounter (Signed)
Progress note: Received fax from pathology and gave them to [name] NP

## 2019-11-26 NOTE — Telephone Encounter (Signed)
(  from [place] 11/23/2019) TC per [name] NP to pathology([id number]) to get  [facility] result from 10/18/2019. Gave them [phone] to send over results and they stated that they would send them over today.

## 2019-11-27 ENCOUNTER — Telehealth: Payer: Self-pay | Admitting: *Deleted

## 2019-11-27 NOTE — Telephone Encounter (Signed)
Reports her father can't make a decision regarding starting Xeloda. She thought all the cancer was gone, so why does he need chemo. Informed her that stage III is high risk of recurrence, which is why adjuvant chemo is given. Encouraged her to give him more time to think about it--can call next week. Explained how it is dosed and he should have less side effects since not having RT at the same time. He is always able to stop treatment if it is too difficult for him.

## 2019-12-13 ENCOUNTER — Inpatient Hospital Stay (HOSPITAL_BASED_OUTPATIENT_CLINIC_OR_DEPARTMENT_OTHER): Payer: Medicare HMO | Admitting: Nurse Practitioner

## 2019-12-13 ENCOUNTER — Inpatient Hospital Stay: Payer: Medicare HMO

## 2019-12-13 ENCOUNTER — Encounter: Payer: Self-pay | Admitting: Nurse Practitioner

## 2019-12-13 ENCOUNTER — Other Ambulatory Visit: Payer: Self-pay

## 2019-12-13 VITALS — BP 150/73 | HR 89 | Temp 98.2°F | Resp 17 | Ht 70.0 in | Wt 172.8 lb

## 2019-12-13 DIAGNOSIS — C20 Malignant neoplasm of rectum: Secondary | ICD-10-CM

## 2019-12-13 DIAGNOSIS — C7A8 Other malignant neuroendocrine tumors: Secondary | ICD-10-CM | POA: Diagnosis not present

## 2019-12-13 LAB — CMP (CANCER CENTER ONLY)
ALT: 13 U/L (ref 0–44)
AST: 11 U/L — ABNORMAL LOW (ref 15–41)
Albumin: 3.1 g/dL — ABNORMAL LOW (ref 3.5–5.0)
Alkaline Phosphatase: 67 U/L (ref 38–126)
Anion gap: 10 (ref 5–15)
BUN: 20 mg/dL (ref 8–23)
CO2: 27 mmol/L (ref 22–32)
Calcium: 9.5 mg/dL (ref 8.9–10.3)
Chloride: 105 mmol/L (ref 98–111)
Creatinine: 1.26 mg/dL — ABNORMAL HIGH (ref 0.61–1.24)
GFR, Est AFR Am: >60 mL/min (ref >60)
GFR, Estimated: 54 mL/min — ABNORMAL LOW (ref >60)
Glucose, Bld: 100 mg/dL — ABNORMAL HIGH (ref 70–99)
Potassium: 4.3 mmol/L (ref 3.5–5.1)
Sodium: 142 mmol/L (ref 135–145)
Total Bilirubin: 0.3 mg/dL (ref 0.3–1.2)
Total Protein: 7.3 g/dL (ref 6.5–8.1)

## 2019-12-13 LAB — CBC WITH DIFFERENTIAL (CANCER CENTER ONLY)
Abs Immature Granulocytes: 0.04 10*3/uL (ref 0.00–0.07)
Basophils Absolute: 0 10*3/uL (ref 0.0–0.1)
Basophils Relative: 0 %
Eosinophils Absolute: 0.1 10*3/uL (ref 0.0–0.5)
Eosinophils Relative: 2 %
HCT: 27.9 % — ABNORMAL LOW (ref 39.0–52.0)
Hemoglobin: 8.8 g/dL — ABNORMAL LOW (ref 13.0–17.0)
Immature Granulocytes: 1 %
Lymphocytes Relative: 9 %
Lymphs Abs: 0.6 10*3/uL — ABNORMAL LOW (ref 0.7–4.0)
MCH: 26.9 pg (ref 26.0–34.0)
MCHC: 31.5 g/dL (ref 30.0–36.0)
MCV: 85.3 fL (ref 80.0–100.0)
Monocytes Absolute: 0.7 10*3/uL (ref 0.1–1.0)
Monocytes Relative: 10 %
Neutro Abs: 5.8 10*3/uL (ref 1.7–7.7)
Neutrophils Relative %: 78 %
Platelet Count: 316 10*3/uL (ref 150–400)
RBC: 3.27 MIL/uL — ABNORMAL LOW (ref 4.22–5.81)
RDW: 14.8 % (ref 11.5–15.5)
WBC Count: 7.4 10*3/uL (ref 4.0–10.5)
nRBC: 0 % (ref 0.0–0.2)

## 2019-12-13 MED ORDER — CAPECITABINE 500 MG PO TABS: 1500.0000 mg | ORAL_TABLET | Freq: Two times a day (BID) | ORAL | 0 refills | Status: DC

## 2019-12-13 NOTE — Progress Notes (Addendum)
[facility] OFFICE PROGRESS NOTE   Diagnosis: Rectal cancer  INTERVAL HISTORY:   Shawn Chambers returns for follow-up.  He reports periodic diarrhea.  No nausea or vomiting.  He notes that the colostomy bag intermittently feels up with "gas".  Objective:  Vital signs in last 24 hours:  Blood pressure (!) 150/73, pulse 89, temperature 98.2 F (36.8 C), temperature source Temporal, resp. rate 17, height '5\' 10"'  (1.778 m), weight 172 lb 12.8 oz (78.4 kg), SpO2 100 %.    HEENT: No thrush or ulcers. GI: Abdomen soft and nontender.  No hepatomegaly.  Left lower quadrant colostomy. Vascular: No leg edema.  Skin: Palms without erythema.  Small scabbed lesion right lower abdomen with mild surrounding erythema.  No induration.   Lab Results:  Lab Results  Component Value Date   WBC 7.4 12/13/2019   HGB 8.8 (L) 12/13/2019   HCT 27.9 (L) 12/13/2019   MCV 85.3 12/13/2019   PLT 316 12/13/2019   NEUTROABS 5.8 12/13/2019    Imaging:  No results found.  Medications: I have reviewed the patient's current medications.  Assessment/Plan: 1. Colon cancer, descending colon, stage II (T3 N0), status post a partial left colectomy 03/28/2017 ? MSI-stable, no loss of mismatch repair protein expression ? CT abdomen/pelvis 03/26/2017 and CT chest 03/27/2017-no evidence metastatic disease, abdomen/pelvis CT-long segment of bowel wall thickening in the left colon, partial mechanical obstruction by the area of thickening in the left colon, solid enhancing left renal lesion ? Elevated preoperative CEA ? CT 11/15/2018-irregular rectal wall thickening/perirectal stranding, polypoid defect in the mid transverse colon proximal to the colostomy-indeterminate, nodularity of the prostate with a hyperdense component in the median lobe-stable ? MRI pelvis 02/01/2019-T3BN1 rectal tumor at 4.5 cm from the internal anal sphincter, 11.5 cm in length ? Colonoscopy 02/22/2019-malignant completely  obstructing tumor in the proximal rectum. Polyps in the mid and distal rectum; polyps in the ascending, transverse/distal transverse colon. 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 Rectum biopsy shows high-grade dysplasia with focal features suspicious for adenocarcinoma. ? Radiation/Xeloda 03/06/2019-04/12/2019 ? MRI pelvis 08/17/2019-4.8 cm apple core lesion at the rectosigmoid junction, T3B N0; distance from tumor to the internal anal sphincter is 11.5 cm.   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 BPH  4. History of tobacco use  5.History of an irregular mole at the right upper back  6. Clinical stage III rectal cancer ? Colonoscopy 02/22/2019-malignant completely obstructing tumor in the proximal rectum. Polyps in the mid and distal rectum; polyps in the ascending, transverse/distal transverse colon. 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 Rectum biopsy shows high-grade dysplasia with focal features suspicious for adenocarcinoma. ? Radiation/Xeloda 03/06/2019-04/12/2019 ? Flexible sigmoidoscopy 06/11/2019-infiltrative and ulcerated completely obstructing large mass found in the rectosigmoid colon/very proximal rectum. Mass circumferential. No bleeding present. Lesion now less fungating but still large in size. 25 mm polyp found in the proximal rectum. 12 mm polyp found in the distal rectum. ? Radiation/Xeloda 03/06/2019-04/12/2019 ? MRI pelvis 08/17/2019-4.8 cm apple core lesion at the rectosigmoid junction, T3B N0; distance from tumor to the internal anal sphincter is 11.5 cm. ? Low anterior resection and repair of parastomal hernia 10/18/2019,pT4pN1a,tumor involves the visceral peritoneum, adenocarcinoma present at the serosa of the distal resection margin, lymphovascular invasion present, 1/22 lymph nodes positive, multiple tumor deposits; MSI stable  7. Rectal polyps 06/11/2019-well-differentiated neuroendocrine  tumor arising in association with a tubulovillous adenoma with focal high-grade dysplasia. Tumor appears to involve superficial submucosa. Tumor present at  cauterized resection margin (polyp base). No evidence of lymphovascular invasion. Other polyp showing tubulovillous adenoma with focal high-grade dysplasia.    Disposition: Shawn Chambers appears stable.  He would like to proceed with adjuvant Xeloda.  We again reviewed potential toxicities.  He will begin cycle 1 on 12/24/2019.  He understands to discontinue Xeloda and contact the office with mouth sores, diarrhea, hand or foot pain or redness or other concerning symptoms.  We reviewed the CBC from today.  He has persistent anemia.  He is asymptomatic.  We will check iron studies at his next lab appointment.  He will return for lab and follow-up on 01/07/2019.  He will contact the office in the interim as outlined above or with any other problems.  Patient seen with Dr. Benay Chambers.  Above discussed with Shawn Chambers, daughter, by phone.  Shawn Chambers ANP/GNP-BC   12/13/2019  10:20 AM  This was a shared visit with Shawn Chambers.  Shawn Chambers has decided to proceed with adjuvant therapy.  We reviewed potential toxicities associated with Xeloda.  He has a high risk of developing recurrent colorectal cancer and adjuvant treatment has been delayed.  The plan is to begin adjuvant Xeloda 12/24/2019.  We will follow-up on the CEA and CBC after the first cycle of Xeloda.  His daughter was contacted to discuss the treatment plan.  [name], MD

## 2019-12-17 ENCOUNTER — Telehealth: Payer: Self-pay | Admitting: Pharmacist

## 2019-12-17 DIAGNOSIS — C20 Malignant neoplasm of rectum: Secondary | ICD-10-CM

## 2019-12-17 NOTE — Telephone Encounter (Signed)
Oral Oncology Pharmacist Encounter  Received new prescription for Xeloda (capecitabine) for the adjuvant treatment of stage III rectal cancer. Planned start 12/24/19  CMP from 12/13/2019 assessed, SCr slightly elevated, CrCl 49.1 mL/min. Dose reduced appropriately for the renal function. Prescription dose and frequency assessed.   Current medication list in Epic reviewed, no DDIs with capecitabine identified.  Prescription has been e-scribed to the [facility] for benefits analysis and approval.  Oral Oncology Clinic will continue to follow for insurance authorization, copayment issues, initial counseling and start date.  Darl Pikes, PharmD, BCPS, [facility] Hematology/Oncology Clinical Pharmacist [facility] [place] [facility] [phone]  12/18/2019 8:53 PM

## 2019-12-18 ENCOUNTER — Telehealth: Payer: Self-pay

## 2019-12-18 NOTE — Telephone Encounter (Signed)
Oral Oncology Patient Advocate Encounter  Received notification from [facility] that prior authorization for Xeloda is required.  PA submitted on CoverMyMeds Key [id number] Status is pending  Oral Oncology Clinic will continue to follow.  Shawn Chambers Phone [phone] Fax [phone] 12/18/2019 8:27 AM

## 2019-12-19 ENCOUNTER — Telehealth: Payer: Self-pay | Admitting: *Deleted

## 2019-12-19 MED ORDER — CAPECITABINE 500 MG PO TABS: 1500.0000 mg | ORAL_TABLET | Freq: Two times a day (BID) | ORAL | 0 refills | Status: DC

## 2019-12-19 NOTE — Telephone Encounter (Signed)
Patient reporting constipation--takes stool softener and then has loose stools. Asking for suggestion. Informed daughter to see if he will take MiraLax 17 grams daily. If too much, can decrease to  1/2 capful. Once he starts the RT/Xeloda, his constipation should improve.

## 2019-12-19 NOTE — Telephone Encounter (Signed)
Oral Chemotherapy Pharmacist Encounter  [place] will deliver medication to Shawn Chambers on 12/21/19. His daughter knows ne is to get started on Monday 12/24/19.  Patient Education I spoke with patient's daughter Shawn Chambers for overview of new oral chemotherapy medication: Xeloda (capecitabine) for the adjuvant treatment of stage III rectal cancer. Planned start 12/24/19.   Pt is doing well. Counseled patient on administration, dosing, side effects, monitoring, drug-food interactions, safe handling, storage, and disposal. Patient will take 3 tablets (1,500 mg total) by mouth 2 (two) times daily after a meal. Take for 14 days, then hold for 7 days.  Side effects include but not limited to: hand-foot syndrome, diarrhea, N/V, fatigue, decreased wbc/hgb/plt.    Reviewed with patient importance of keeping a medication schedule and plan for any missed doses.  Shawn Chambers voiced understanding and appreciation. All questions answered. Medication handout and calendard placed in the mail.  Provided patient with Oral [facility] phone number. Patient knows to call the office with questions or concerns. Oral Chemotherapy Navigation Clinic will continue to follow.  Darl Pikes, PharmD, BCPS, [facility] Hematology/Oncology Clinical Pharmacist [facility]/AP [facility] [place] [facility] [phone]  12/19/2019 2:18 PM

## 2019-12-20 MED FILL — CAPECITABINE 500 MG TABS: 500 | 21 days supply | Qty: 84 | Fill #0

## 2019-12-28 ENCOUNTER — Telehealth: Payer: Self-pay | Admitting: *Deleted

## 2019-12-28 NOTE — Telephone Encounter (Signed)
Reports patient w/no BM for 24 hours from ostomy and having some nausea. When RN returned call, patient had expelled hard stool and then having liquid stool now. Nausea has resolved as well. He does take laxative tablet on occasion. Suggested take a stool softener daily or MiraLax daily instead. He will review this with his father to see if he agrees.

## 2020-01-08 ENCOUNTER — Telehealth: Payer: Self-pay | Admitting: *Deleted

## 2020-01-08 ENCOUNTER — Inpatient Hospital Stay: Payer: Medicare HMO | Attending: Oncology | Admitting: Oncology

## 2020-01-08 ENCOUNTER — Inpatient Hospital Stay: Payer: Medicare HMO

## 2020-01-08 NOTE — Telephone Encounter (Signed)
Daughter had left VM early in am requesting to reschedule today's appointment. Rescheduled for 2/2 on her next day off. She reports he has not been taking the xeloda consistently. Took if for a few days, then stopped for several days and then took again for 2 days. Felt it constipated him.

## 2020-01-09 ENCOUNTER — Other Ambulatory Visit: Payer: Self-pay | Admitting: Oncology

## 2020-01-09 DIAGNOSIS — C20 Malignant neoplasm of rectum: Secondary | ICD-10-CM

## 2020-01-15 ENCOUNTER — Inpatient Hospital Stay (HOSPITAL_BASED_OUTPATIENT_CLINIC_OR_DEPARTMENT_OTHER): Payer: Medicare HMO | Admitting: Oncology

## 2020-01-15 ENCOUNTER — Other Ambulatory Visit: Payer: Self-pay

## 2020-01-15 ENCOUNTER — Inpatient Hospital Stay: Payer: Medicare HMO | Attending: Oncology

## 2020-01-15 VITALS — BP 138/83 | HR 73 | Temp 98.0°F | Resp 17 | Ht 70.0 in | Wt 174.5 lb

## 2020-01-15 DIAGNOSIS — Z933 Colostomy status: Secondary | ICD-10-CM | POA: Diagnosis not present

## 2020-01-15 DIAGNOSIS — N400 Enlarged prostate without lower urinary tract symptoms: Secondary | ICD-10-CM | POA: Insufficient documentation

## 2020-01-15 DIAGNOSIS — Z85038 Personal history of other malignant neoplasm of large intestine: Secondary | ICD-10-CM | POA: Insufficient documentation

## 2020-01-15 DIAGNOSIS — Z9049 Acquired absence of other specified parts of digestive tract: Secondary | ICD-10-CM | POA: Insufficient documentation

## 2020-01-15 DIAGNOSIS — K5900 Constipation, unspecified: Secondary | ICD-10-CM | POA: Insufficient documentation

## 2020-01-15 DIAGNOSIS — C20 Malignant neoplasm of rectum: Secondary | ICD-10-CM | POA: Diagnosis not present

## 2020-01-15 DIAGNOSIS — Z8719 Personal history of other diseases of the digestive system: Secondary | ICD-10-CM | POA: Diagnosis not present

## 2020-01-15 DIAGNOSIS — Z905 Acquired absence of kidney: Secondary | ICD-10-CM | POA: Diagnosis not present

## 2020-01-15 DIAGNOSIS — Z79899 Other long term (current) drug therapy: Secondary | ICD-10-CM | POA: Diagnosis not present

## 2020-01-15 DIAGNOSIS — C7A8 Other malignant neuroendocrine tumors: Secondary | ICD-10-CM | POA: Insufficient documentation

## 2020-01-15 DIAGNOSIS — Z87891 Personal history of nicotine dependence: Secondary | ICD-10-CM | POA: Insufficient documentation

## 2020-01-15 LAB — CMP (CANCER CENTER ONLY)
ALT: 9 U/L (ref 0–44)
AST: 11 U/L — ABNORMAL LOW (ref 15–41)
Albumin: 3.5 g/dL (ref 3.5–5.0)
Alkaline Phosphatase: 70 U/L (ref 38–126)
Anion gap: 9 (ref 5–15)
BUN: 21 mg/dL (ref 8–23)
CO2: 28 mmol/L (ref 22–32)
Calcium: 9.3 mg/dL (ref 8.9–10.3)
Chloride: 105 mmol/L (ref 98–111)
Creatinine: 1.35 mg/dL — ABNORMAL HIGH (ref 0.61–1.24)
GFR, Est AFR Am: 57 mL/min — ABNORMAL LOW (ref >60)
GFR, Estimated: 50 mL/min — ABNORMAL LOW (ref >60)
Glucose, Bld: 93 mg/dL (ref 70–99)
Potassium: 4.5 mmol/L (ref 3.5–5.1)
Sodium: 142 mmol/L (ref 135–145)
Total Bilirubin: 0.4 mg/dL (ref 0.3–1.2)
Total Protein: 7.1 g/dL (ref 6.5–8.1)

## 2020-01-15 LAB — CBC WITH DIFFERENTIAL (CANCER CENTER ONLY)
Abs Immature Granulocytes: 0.02 10*3/uL (ref 0.00–0.07)
Basophils Absolute: 0 10*3/uL (ref 0.0–0.1)
Basophils Relative: 1 %
Eosinophils Absolute: 0.1 10*3/uL (ref 0.0–0.5)
Eosinophils Relative: 2 %
HCT: 30.6 % — ABNORMAL LOW (ref 39.0–52.0)
Hemoglobin: 9.4 g/dL — ABNORMAL LOW (ref 13.0–17.0)
Immature Granulocytes: 0 %
Lymphocytes Relative: 11 %
Lymphs Abs: 0.6 10*3/uL — ABNORMAL LOW (ref 0.7–4.0)
MCH: 26.9 pg (ref 26.0–34.0)
MCHC: 30.7 g/dL (ref 30.0–36.0)
MCV: 87.7 fL (ref 80.0–100.0)
Monocytes Absolute: 0.6 10*3/uL (ref 0.1–1.0)
Monocytes Relative: 10 %
Neutro Abs: 4.5 10*3/uL (ref 1.7–7.7)
Neutrophils Relative %: 76 %
Platelet Count: 244 10*3/uL (ref 150–400)
RBC: 3.49 MIL/uL — ABNORMAL LOW (ref 4.22–5.81)
RDW: 17 % — ABNORMAL HIGH (ref 11.5–15.5)
WBC Count: 5.9 10*3/uL (ref 4.0–10.5)
nRBC: 0 % (ref 0.0–0.2)

## 2020-01-15 LAB — IRON AND TIBC
Iron: 49 ug/dL (ref 42–163)
Saturation Ratios: 19 % — ABNORMAL LOW (ref 20–55)
TIBC: 256 ug/dL (ref 202–409)
UIBC: 208 ug/dL (ref 117–376)

## 2020-01-15 LAB — FERRITIN: Ferritin: 147 ng/mL (ref 24–336)

## 2020-01-15 LAB — CEA (IN HOUSE-CHCC): CEA (CHCC-In House): 16.78 ng/mL — ABNORMAL HIGH (ref 0.00–5.00)

## 2020-01-15 MED ORDER — CAPECITABINE 500 MG PO TABS: 1500.0000 mg | ORAL_TABLET | Freq: Two times a day (BID) | ORAL | 0 refills | Status: DC

## 2020-01-15 NOTE — Telephone Encounter (Signed)
Oral Oncology Patient Advocate Encounter  Prior authorization for Xeloda not needed. It had to be processed under Medicare B first to reject, then process through Medicare D for an approved claim.  Copay $16.80  [place] [facility] [place] Phone [phone] Fax [phone] 01/15/2020 7:49 PM

## 2020-01-15 NOTE — Progress Notes (Signed)
Shawn Chambers   Diagnosis: Rectal cancer  INTERVAL HISTORY:   Shawn Chambers returns today after missing a scheduled appointment last week.  He began Xeloda on 12/24/2019.  He and his daughter report he has not taken Xeloda consistently.  He has been taking 2 instead of 3 pills and has missed several days.  No mouth sores, diarrhea, or hand/foot pain. Mr. Dufresne continues to have discharge from the rectum.  He complains of constipation.  He took 1 dose of MiraLAX and this did not help.  He also tried Dulcolax.  Objective:  Vital signs in last 24 hours:  Blood pressure 138/83, pulse 73, temperature 98 F (36.7 C), temperature source Temporal, resp. rate 17, height '5\' 10"'  (1.778 m), weight 174 lb 8 oz (79.2 kg), SpO2 100 %.    HEENT: No thrush or ulcers  GI: No hepatomegaly, nontender, left lower quadrant colostomy, no mass Vascular: No leg edema  Skin: Palms without erythema    Lab Results:  Lab Results  Component Value Date   WBC 5.9 01/15/2020   HGB 9.4 (L) 01/15/2020   HCT 30.6 (L) 01/15/2020   MCV 87.7 01/15/2020   PLT 244 01/15/2020   NEUTROABS 4.5 01/15/2020    CMP  Lab Results  Component Value Date   NA 142 12/13/2019   K 4.3 12/13/2019   CL 105 12/13/2019   CO2 27 12/13/2019   GLUCOSE 100 (H) 12/13/2019   BUN 20 12/13/2019   CREATININE 1.26 (H) 12/13/2019   CALCIUM 9.5 12/13/2019   PROT 7.3 12/13/2019   ALBUMIN 3.1 (L) 12/13/2019   AST 11 (L) 12/13/2019   ALT 13 12/13/2019   ALKPHOS 67 12/13/2019   BILITOT 0.3 12/13/2019   GFRNONAA 54 (L) 12/13/2019   GFRAA >60 12/13/2019    Lab Results  Component Value Date   CEA1 10.49 (H) 11/22/2019     Medications: I have reviewed the patient's current medications.   Assessment/Plan: 1. Colon cancer, descending colon, stage II (T3 N0), status post a partial left colectomy 03/28/2017 ? MSI-stable, no loss of mismatch repair protein expression ? CT abdomen/pelvis  03/26/2017 and CT chest 03/27/2017-no evidence metastatic disease, abdomen/pelvis CT-long segment of bowel wall thickening in the left colon, partial mechanical obstruction by the area of thickening in the left colon, solid enhancing left renal lesion ? Elevated preoperative CEA ? CT 11/15/2018-irregular rectal wall thickening/perirectal stranding, polypoid defect in the mid transverse colon proximal to the colostomy-indeterminate, nodularity of the prostate with a hyperdense component in the median lobe-stable ? MRI pelvis 02/01/2019-T3BN1 rectal tumor at 4.5 cm from the internal anal sphincter, 11.5 cm in length ? Colonoscopy 02/22/2019-malignant completely obstructing tumor in the proximal rectum. Polyps in the mid and distal rectum; polyps in the ascending, transverse/distal transverse colon. 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 Rectum biopsy shows high-grade dysplasia with focal features suspicious for adenocarcinoma. ? Radiation/Xeloda 03/06/2019-04/12/2019 ? MRI pelvis 08/17/2019-4.8 cm apple core lesion at the rectosigmoid junction, T3B N0; distance from tumor to the internal anal sphincter is 11.5 cm.   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 BPH  4. History of tobacco use  5.History of an irregular mole at the right upper back  6. Clinical stage III rectal cancer ? Colonoscopy 02/22/2019-malignant completely obstructing tumor in the proximal rectum. Polyps in the mid and distal rectum; polyps in the ascending, transverse/distal transverse colon. 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 Rectum biopsy shows high-grade dysplasia with  focal features suspicious for adenocarcinoma. ? Radiation/Xeloda 03/06/2019-04/12/2019 ? Flexible sigmoidoscopy 06/11/2019-infiltrative and ulcerated completely obstructing large mass found in the rectosigmoid colon/very proximal rectum. Mass circumferential. No bleeding present.  Lesion now less fungating but still large in size. 25 mm polyp found in the proximal rectum. 12 mm polyp found in the distal rectum. ? Radiation/Xeloda 03/06/2019-04/12/2019 ? MRI pelvis 08/17/2019-4.8 cm apple core lesion at the rectosigmoid junction, T3B N0; distance from tumor to the internal anal sphincter is 11.5 cm. ? Low anterior resection and repair of parastomal hernia 10/18/2019,pT4pN1a,tumor involves the visceral peritoneum, adenocarcinoma present at the serosa of the distal resection margin, lymphovascular invasion present, 1/22 lymph nodes positive, multiple tumor deposits; MSI stable ? Cycle 1 adjuvant Xeloda 12/24/2019, took wrong dose and missed several days, discontinued 01/15/2020 ? Cycle 2 adjuvant Xeloda 01/21/2020  7. 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 Tumor appears to involve superficial submucosa. Tumor present at cauterized resection margin (polyp base). No evidence of lymphovascular invasion. Other polyp showing tubulovillous adenoma with focal high-grade dysplasia.     Disposition: Shawn Chambers appears well.  He began adjuvant Xeloda on 12/24/2019, but took the wrong dose and missed many days of the first cycle.  He is still taking Xeloda today.  I recommended he discontinue Xeloda.  He will begin cycle 2 on 01/21/2020.  I reviewed the importance of taking the Xeloda as prescribed with Mr. Albro and his daughter.  His daughter will be sure he is taking the correct dose and schedule.  Mr. Szumski will begin MiraLAX and a stool softener for constipation.  I recommended he schedule a COVID-19 vaccine.  He is not sure whether he will take the Covid vaccine.  Mr. Naill will return for an office visit on 02/08/2020.  We will follow up on the CEA from today and again on 02/08/2020.  I explained to his daughter that Mr. Leavy may already have metastatic  rectal cancer.  Betsy Coder, MD  01/15/2020  9:41 AM

## 2020-01-15 NOTE — Patient Instructions (Signed)
Constipation:  MiraLax 71 gram in 8 ounces fluid EVERYDAY  Docusate (Colace) 100 mg twice daily   You will begin next cycle of Xeloda chemo on 01/21/20 (stop taking now)

## 2020-01-30 MED FILL — CAPECITABINE 500 MG TABLET: 500 | 21 days supply | Qty: 84 | Fill #0

## 2020-02-08 ENCOUNTER — Inpatient Hospital Stay: Payer: Medicare HMO | Admitting: Nurse Practitioner

## 2020-02-08 ENCOUNTER — Inpatient Hospital Stay: Payer: Medicare HMO

## 2020-02-08 ENCOUNTER — Telehealth: Payer: Self-pay | Admitting: Nurse Practitioner

## 2020-02-08 NOTE — Telephone Encounter (Signed)
Called pt per 2/26 sch message - no answer . Left message for pt daughter to call back to reschedule appt ./

## 2020-02-11 ENCOUNTER — Telehealth: Payer: Self-pay | Admitting: Oncology

## 2020-02-11 NOTE — Telephone Encounter (Signed)
Returned patient's phone call regarding rescheduling an appointment, left a voicemail. 

## 2020-02-15 ENCOUNTER — Encounter: Payer: Self-pay | Admitting: Nurse Practitioner

## 2020-02-15 ENCOUNTER — Other Ambulatory Visit: Payer: Self-pay | Admitting: Oncology

## 2020-02-15 ENCOUNTER — Inpatient Hospital Stay (HOSPITAL_BASED_OUTPATIENT_CLINIC_OR_DEPARTMENT_OTHER): Payer: Medicare Other | Admitting: Nurse Practitioner

## 2020-02-15 ENCOUNTER — Inpatient Hospital Stay: Payer: Medicare Other | Attending: Oncology

## 2020-02-15 ENCOUNTER — Other Ambulatory Visit: Payer: Self-pay

## 2020-02-15 ENCOUNTER — Telehealth: Payer: Self-pay | Admitting: Oncology

## 2020-02-15 VITALS — BP 125/70 | HR 68 | Temp 98.5°F | Resp 18 | Ht 70.0 in | Wt 175.3 lb

## 2020-02-15 DIAGNOSIS — Z905 Acquired absence of kidney: Secondary | ICD-10-CM | POA: Insufficient documentation

## 2020-02-15 DIAGNOSIS — Z79899 Other long term (current) drug therapy: Secondary | ICD-10-CM | POA: Diagnosis not present

## 2020-02-15 DIAGNOSIS — C7A8 Other malignant neuroendocrine tumors: Secondary | ICD-10-CM | POA: Diagnosis present

## 2020-02-15 DIAGNOSIS — Z933 Colostomy status: Secondary | ICD-10-CM | POA: Diagnosis not present

## 2020-02-15 DIAGNOSIS — N400 Enlarged prostate without lower urinary tract symptoms: Secondary | ICD-10-CM | POA: Diagnosis not present

## 2020-02-15 DIAGNOSIS — Z87891 Personal history of nicotine dependence: Secondary | ICD-10-CM | POA: Insufficient documentation

## 2020-02-15 DIAGNOSIS — C186 Malignant neoplasm of descending colon: Secondary | ICD-10-CM

## 2020-02-15 DIAGNOSIS — C20 Malignant neoplasm of rectum: Secondary | ICD-10-CM

## 2020-02-15 LAB — CBC WITH DIFFERENTIAL (CANCER CENTER ONLY)
Abs Immature Granulocytes: 0.01 10*3/uL (ref 0.00–0.07)
Basophils Absolute: 0 10*3/uL (ref 0.0–0.1)
Basophils Relative: 0 %
Eosinophils Absolute: 0.1 10*3/uL (ref 0.0–0.5)
Eosinophils Relative: 2 %
HCT: 30.9 % — ABNORMAL LOW (ref 39.0–52.0)
Hemoglobin: 9.8 g/dL — ABNORMAL LOW (ref 13.0–17.0)
Immature Granulocytes: 0 %
Lymphocytes Relative: 16 %
Lymphs Abs: 0.8 10*3/uL (ref 0.7–4.0)
MCH: 28.2 pg (ref 26.0–34.0)
MCHC: 31.7 g/dL (ref 30.0–36.0)
MCV: 89 fL (ref 80.0–100.0)
Monocytes Absolute: 0.7 10*3/uL (ref 0.1–1.0)
Monocytes Relative: 14 %
Neutro Abs: 3.3 10*3/uL (ref 1.7–7.7)
Neutrophils Relative %: 68 %
Platelet Count: 192 10*3/uL (ref 150–400)
RBC: 3.47 MIL/uL — ABNORMAL LOW (ref 4.22–5.81)
RDW: 20.9 % — ABNORMAL HIGH (ref 11.5–15.5)
WBC Count: 4.9 10*3/uL (ref 4.0–10.5)
nRBC: 0 % (ref 0.0–0.2)

## 2020-02-15 LAB — CMP (CANCER CENTER ONLY)
ALT: 9 U/L (ref 0–44)
AST: 14 U/L — ABNORMAL LOW (ref 15–41)
Albumin: 3.6 g/dL (ref 3.5–5.0)
Alkaline Phosphatase: 71 U/L (ref 38–126)
Anion gap: 8 (ref 5–15)
BUN: 22 mg/dL (ref 8–23)
CO2: 26 mmol/L (ref 22–32)
Calcium: 9.2 mg/dL (ref 8.9–10.3)
Chloride: 106 mmol/L (ref 98–111)
Creatinine: 1.38 mg/dL — ABNORMAL HIGH (ref 0.61–1.24)
GFR, Est AFR Am: 56 mL/min — ABNORMAL LOW (ref >60)
GFR, Estimated: 48 mL/min — ABNORMAL LOW (ref >60)
Glucose, Bld: 108 mg/dL — ABNORMAL HIGH (ref 70–99)
Potassium: 4.8 mmol/L (ref 3.5–5.1)
Sodium: 140 mmol/L (ref 135–145)
Total Bilirubin: 0.3 mg/dL (ref 0.3–1.2)
Total Protein: 7.2 g/dL (ref 6.5–8.1)

## 2020-02-15 LAB — CEA (IN HOUSE-CHCC): CEA (CHCC-In House): 29.87 ng/mL — ABNORMAL HIGH (ref 0.00–5.00)

## 2020-02-15 NOTE — Telephone Encounter (Signed)
Scheduled per los. Gave avs and calendar  

## 2020-02-15 NOTE — Progress Notes (Signed)
[place] OFFICE PROGRESS NOTE   Diagnosis: Rectal cancer  INTERVAL HISTORY:   Mr. Morais returns after missing an appointment last week.  He reports beginning cycle 3 Xeloda on 02/11/2020.  He denies nausea/vomiting related to the Xeloda.  No mouth sores.  No diarrhea.  No hand or foot pain or redness.  He reports having a "dizzy spell" with nausea recently.  This resolved over the a few minutes.  He recalls 2 similar episodes over the past 25 years.  Objective:  Vital signs in last 24 hours:  Blood pressure 125/70, pulse 68, temperature 98.5 F (36.9 C), temperature source Temporal, resp. rate 18, height '5\' 10"'  (1.778 m), weight 175 lb 4.8 oz (79.5 kg), SpO2 100 %.    HEENT: No thrush or ulcers. GI: Abdomen soft and nontender.  No hepatomegaly.  Left lower quadrant colostomy. Vascular: No leg edema.  Skin: Palms without erythema.   Lab Results:  Lab Results  Component Value Date   WBC 4.9 02/15/2020   HGB 9.8 (L) 02/15/2020   HCT 30.9 (L) 02/15/2020   MCV 89.0 02/15/2020   PLT 192 02/15/2020   NEUTROABS 3.3 02/15/2020    Imaging:  No results found.  Medications: I have reviewed the patient's current medications.  Assessment/Plan: 1. Colon cancer, descending colon, stage II (T3 N0), status post a partial left colectomy 03/28/2017 ? MSI-stable, no loss of mismatch repair protein expression ? CT abdomen/pelvis 03/26/2017 and CT chest 03/27/2017-no evidence metastatic disease, abdomen/pelvis CT-long segment of bowel wall thickening in the left colon, partial mechanical obstruction by the area of thickening in the left colon, solid enhancing left renal lesion ? Elevated preoperative CEA ? CT 11/15/2018-irregular rectal wall thickening/perirectal stranding, polypoid defect in the mid transverse colon proximal to the colostomy-indeterminate, nodularity of the prostate with a hyperdense component in the median lobe-stable ? MRI pelvis 02/01/2019-T3BN1  rectal tumor at 4.5 cm from the internal anal sphincter, 11.5 cm in length ? Colonoscopy 02/22/2019-malignant completely obstructing tumor in the proximal rectum. Polyps in the mid and distal rectum; polyps in the ascending, transverse/distal transverse colon. 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 Rectum biopsy shows high-grade dysplasia with focal features suspicious for adenocarcinoma. ? Radiation/Xeloda 03/06/2019-04/12/2019 ? MRI pelvis 08/17/2019-4.8 cm apple core lesion at the rectosigmoid junction, T3B N0; distance from tumor to the internal anal sphincter is 11.5 cm.   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 BPH  4. History of tobacco use  5.History of an irregular mole at the right upper back  6. Clinical stage III rectal cancer ? Colonoscopy 02/22/2019-malignant completely obstructing tumor in the proximal rectum. Polyps in the mid and distal rectum; polyps in the ascending, transverse/distal transverse colon. 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 Rectum biopsy shows high-grade dysplasia with focal features suspicious for adenocarcinoma. ? Radiation/Xeloda 03/06/2019-04/12/2019 ? Flexible sigmoidoscopy 06/11/2019-infiltrative and ulcerated completely obstructing large mass found in the rectosigmoid colon/very proximal rectum. Mass circumferential. No bleeding present. Lesion now less fungating but still large in size. 25 mm polyp found in the proximal rectum. 12 mm polyp found in the distal rectum. ? Radiation/Xeloda 03/06/2019-04/12/2019 ? MRI pelvis 08/17/2019-4.8 cm apple core lesion at the rectosigmoid junction, T3B N0; distance from tumor to the internal anal sphincter is 11.5 cm. ? Low anterior resection and repair of parastomal hernia 10/18/2019,pT4pN1a,tumor involves the visceral peritoneum, adenocarcinoma present at the serosa of the distal resection margin, lymphovascular invasion present, 1/22  lymph nodes positive, multiple tumor deposits;MSI stable ?  Cycle 1 adjuvant Xeloda 12/24/2019, took wrong dose and missed several days, discontinued 01/15/2020 ? Cycle 2 adjuvant Xeloda 01/21/2020 ? Cycle 3 adjuvant Xeloda 02/11/2020  7. 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 Tumor appears to involve superficial submucosa. Tumor present at cauterized resection margin (polyp base). No evidence of lymphovascular invasion. Other polyp showing tubulovillous adenoma with focal high-grade dysplasia.   Disposition: Shawn Chambers appears stable.  He recently began cycle 3 adjuvant Xeloda.  Overall he seems to be tolerating chemotherapy well.  We reviewed the CBC from today.  Counts adequate to continue with Xeloda as he is currently taking.  He will return for lab and follow-up in 2 weeks.  He will contact the office in the interim with any problems.  I contacted his daughter, [name], by phone after the office visit and reviewed the above with her.  In addition the CEA result from today was available.  I reviewed that result with her.  She understands the CEA is higher and there is concern this indicates metastatic disease.  We will repeat the CEA in 2 weeks.  If higher again the plan is to refer for CT scans.  Plan reviewed with Dr. Benay Spice.  [name] ANP/GNP-BC   02/15/2020  12:25 PM

## 2020-02-25 MED FILL — CAPECITABINE 500 MG TABLET: 500 | 21 days supply | Qty: 84 | Fill #0

## 2020-02-29 ENCOUNTER — Inpatient Hospital Stay: Payer: Medicare Other

## 2020-02-29 ENCOUNTER — Inpatient Hospital Stay (HOSPITAL_BASED_OUTPATIENT_CLINIC_OR_DEPARTMENT_OTHER): Payer: Medicare Other | Admitting: Oncology

## 2020-02-29 ENCOUNTER — Telehealth: Payer: Self-pay | Admitting: Oncology

## 2020-02-29 ENCOUNTER — Other Ambulatory Visit: Payer: Self-pay

## 2020-02-29 VITALS — BP 121/75 | HR 64 | Temp 98.3°F | Resp 17 | Ht 70.0 in | Wt 175.7 lb

## 2020-02-29 DIAGNOSIS — C20 Malignant neoplasm of rectum: Secondary | ICD-10-CM

## 2020-02-29 DIAGNOSIS — C186 Malignant neoplasm of descending colon: Secondary | ICD-10-CM

## 2020-02-29 DIAGNOSIS — C7A8 Other malignant neuroendocrine tumors: Secondary | ICD-10-CM | POA: Diagnosis not present

## 2020-02-29 LAB — CMP (CANCER CENTER ONLY)
ALT: 7 U/L (ref 0–44)
AST: 12 U/L — ABNORMAL LOW (ref 15–41)
Albumin: 3.4 g/dL — ABNORMAL LOW (ref 3.5–5.0)
Alkaline Phosphatase: 69 U/L (ref 38–126)
Anion gap: 6 (ref 5–15)
BUN: 22 mg/dL (ref 8–23)
CO2: 27 mmol/L (ref 22–32)
Calcium: 9.1 mg/dL (ref 8.9–10.3)
Chloride: 107 mmol/L (ref 98–111)
Creatinine: 1.25 mg/dL — ABNORMAL HIGH (ref 0.61–1.24)
GFR, Est AFR Am: >60 mL/min (ref >60)
GFR, Estimated: 54 mL/min — ABNORMAL LOW (ref >60)
Glucose, Bld: 101 mg/dL — ABNORMAL HIGH (ref 70–99)
Potassium: 4.8 mmol/L (ref 3.5–5.1)
Sodium: 140 mmol/L (ref 135–145)
Total Bilirubin: 0.3 mg/dL (ref 0.3–1.2)
Total Protein: 6.7 g/dL (ref 6.5–8.1)

## 2020-02-29 LAB — CBC WITH DIFFERENTIAL (CANCER CENTER ONLY)
Abs Immature Granulocytes: 0.02 10*3/uL (ref 0.00–0.07)
Basophils Absolute: 0 10*3/uL (ref 0.0–0.1)
Basophils Relative: 0 %
Eosinophils Absolute: 0.1 10*3/uL (ref 0.0–0.5)
Eosinophils Relative: 2 %
HCT: 29.6 % — ABNORMAL LOW (ref 39.0–52.0)
Hemoglobin: 9.4 g/dL — ABNORMAL LOW (ref 13.0–17.0)
Immature Granulocytes: 0 %
Lymphocytes Relative: 13 %
Lymphs Abs: 0.7 10*3/uL (ref 0.7–4.0)
MCH: 28.2 pg (ref 26.0–34.0)
MCHC: 31.8 g/dL (ref 30.0–36.0)
MCV: 88.9 fL (ref 80.0–100.0)
Monocytes Absolute: 0.9 10*3/uL (ref 0.1–1.0)
Monocytes Relative: 17 %
Neutro Abs: 3.7 10*3/uL (ref 1.7–7.7)
Neutrophils Relative %: 68 %
Platelet Count: 185 10*3/uL (ref 150–400)
RBC: 3.33 MIL/uL — ABNORMAL LOW (ref 4.22–5.81)
RDW: 22.5 % — ABNORMAL HIGH (ref 11.5–15.5)
WBC Count: 5.5 10*3/uL (ref 4.0–10.5)
nRBC: 0 % (ref 0.0–0.2)

## 2020-02-29 LAB — CEA (IN HOUSE-CHCC): CEA (CHCC-In House): 44.38 ng/mL — ABNORMAL HIGH (ref 0.00–5.00)

## 2020-02-29 NOTE — Progress Notes (Signed)
[place] OFFICE PROGRESS NOTE   Diagnosis: Rectal cancer  INTERVAL HISTORY:   Shawn Chambers returns for a scheduled visit.  He reports starting the most recent cycle of Xeloda on 02/09/2020.  No mouth sores or diarrhea.  No hand or foot pain.  He has a mild rash over the forearms.  No other complaint.  Objective:  Vital signs in last 24 hours:  Blood pressure 121/75, pulse 64, temperature 98.3 F (36.8 C), temperature source Oral, resp. rate 17, height '5\' 10"'  (1.778 m), weight 175 lb 11.2 oz (79.7 kg), SpO2 100 %.     GI: No hepatomegaly, left lower quadrant colostomy Vascular: No leg edema  Skin: Mild erythematous rash at the bilateral forearm, Palms without erythema  Portacath/PICC-without erythema  Lab Results:  Lab Results  Component Value Date   WBC 5.5 02/29/2020   HGB 9.4 (L) 02/29/2020   HCT 29.6 (L) 02/29/2020   MCV 88.9 02/29/2020   PLT 185 02/29/2020   NEUTROABS 3.7 02/29/2020    CMP  Lab Results  Component Value Date   NA 140 02/29/2020   K 4.8 02/29/2020   CL 107 02/29/2020   CO2 27 02/29/2020   GLUCOSE 101 (H) 02/29/2020   BUN 22 02/29/2020   CREATININE 1.25 (H) 02/29/2020   CALCIUM 9.1 02/29/2020   PROT 6.7 02/29/2020   ALBUMIN 3.4 (L) 02/29/2020   AST 12 (L) 02/29/2020   ALT 7 02/29/2020   ALKPHOS 69 02/29/2020   BILITOT 0.3 02/29/2020   GFRNONAA 54 (L) 02/29/2020   GFRAA >60 02/29/2020    Lab Results  Component Value Date   CEA1 44.38 (H) 02/29/2020     Medications: I have reviewed the patient's current medications.   Assessment/Plan: 1. Colon cancer, descending colon, stage II (T3 N0), status post a partial left colectomy 03/28/2017 ? MSI-stable, no loss of mismatch repair protein expression ? CT abdomen/pelvis 03/26/2017 and CT chest 03/27/2017-no evidence metastatic disease, abdomen/pelvis CT-long segment of bowel wall thickening in the left colon, partial mechanical obstruction by the area of thickening in  the left colon, solid enhancing left renal lesion ? Elevated preoperative CEA ? CT 11/15/2018-irregular rectal wall thickening/perirectal stranding, polypoid defect in the mid transverse colon proximal to the colostomy-indeterminate, nodularity of the prostate with a hyperdense component in the median lobe-stable ? MRI pelvis 02/01/2019-T3BN1 rectal tumor at 4.5 cm from the internal anal sphincter, 11.5 cm in length ? Colonoscopy 02/22/2019-malignant completely obstructing tumor in the proximal rectum. Polyps in the mid and distal rectum; polyps in the ascending, transverse/distal transverse colon. 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 Rectum biopsy shows high-grade dysplasia with focal features suspicious for adenocarcinoma. ? Radiation/Xeloda 03/06/2019-04/12/2019 ? MRI pelvis 08/17/2019-4.8 cm apple core lesion at the rectosigmoid junction, T3B N0; distance from tumor to the internal anal sphincter is 11.5 cm.   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 BPH  4. History of tobacco use  5.History of an irregular mole at the right upper back  6. Clinical stage III rectal cancer ? Colonoscopy 02/22/2019-malignant completely obstructing tumor in the proximal rectum. Polyps in the mid and distal rectum; polyps in the ascending, transverse/distal transverse colon. 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 Rectum biopsy shows high-grade dysplasia with focal features suspicious for adenocarcinoma. ? Radiation/Xeloda 03/06/2019-04/12/2019 ? Flexible sigmoidoscopy 06/11/2019-infiltrative and ulcerated completely obstructing large mass found in the rectosigmoid colon/very proximal rectum. Mass circumferential. No bleeding present. Lesion now less fungating but still large in size.  25 mm polyp found in the proximal rectum. 12 mm polyp found in the distal rectum. ? Radiation/Xeloda 03/06/2019-04/12/2019 ? MRI pelvis 08/17/2019-4.8 cm  apple core lesion at the rectosigmoid junction, T3B N0; distance from tumor to the internal anal sphincter is 11.5 cm. ? Low anterior resection and repair of parastomal hernia 10/18/2019,pT4pN1a,tumor involves the visceral peritoneum, adenocarcinoma present at the serosa of the distal resection margin, lymphovascular invasion present, 1/22 lymph nodes positive, multiple tumor deposits;MSI stable ? Cycle 1 adjuvant Xeloda 12/24/2019, took wrong dose and missed several days, discontinued 01/15/2020 ? Cycle 2 adjuvant Xeloda 01/21/2020 ? Cycle 3 adjuvant Xeloda 02/09/2020 ? Cycle 4 adjuvant Xeloda 03/01/2020  7. 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 Tumor appears to involve superficial submucosa. Tumor present at cauterized resection margin (polyp base). No evidence of lymphovascular invasion. Other polyp showing tubulovillous adenoma with focal high-grade dysplasia.    Disposition: Shawn Chambers has completed 3 cycles of Xeloda.  He is tolerating the Xeloda well.  He will call for an increased rash or new symptoms.  He will begin cycle 4 tomorrow.  The CEA has been higher over the past few months.  He will undergo restaging CTs prior to an office visit in 3 weeks.  We will consider salvage systemic treatment options if he has metastatic disease on the CTs.  I encouraged him to obtain the COVID-19 vaccine.  Betsy Coder, MD  02/29/2020  1:59 PM

## 2020-02-29 NOTE — Telephone Encounter (Signed)
Scheduled per los. Gave avs and calendar  

## 2020-03-03 ENCOUNTER — Telehealth: Payer: Self-pay

## 2020-03-03 NOTE — Telephone Encounter (Signed)
Call back to pt's daughter as requested for an update on her father's care plan. This RN informed pt's daughter per MD Benay Spice that pt is to complete his current  course of Xeloda and return for a CT scan and OV with [name], NP on 03/20/20. Pt's daughter verbalizes understanding and has no other questions/concerns at this time.

## 2020-03-20 ENCOUNTER — Encounter: Payer: Self-pay | Admitting: Nurse Practitioner

## 2020-03-20 ENCOUNTER — Inpatient Hospital Stay: Payer: Medicare Other | Attending: Oncology | Admitting: Nurse Practitioner

## 2020-03-20 ENCOUNTER — Inpatient Hospital Stay: Payer: Medicare Other

## 2020-03-20 ENCOUNTER — Other Ambulatory Visit: Payer: Self-pay

## 2020-03-20 VITALS — BP 145/80 | HR 82 | Temp 98.7°F | Resp 20 | Ht 70.0 in | Wt 178.5 lb

## 2020-03-20 DIAGNOSIS — N400 Enlarged prostate without lower urinary tract symptoms: Secondary | ICD-10-CM | POA: Insufficient documentation

## 2020-03-20 DIAGNOSIS — Z905 Acquired absence of kidney: Secondary | ICD-10-CM | POA: Diagnosis not present

## 2020-03-20 DIAGNOSIS — M79605 Pain in left leg: Secondary | ICD-10-CM | POA: Diagnosis not present

## 2020-03-20 DIAGNOSIS — Z933 Colostomy status: Secondary | ICD-10-CM | POA: Insufficient documentation

## 2020-03-20 DIAGNOSIS — C7A8 Other malignant neuroendocrine tumors: Secondary | ICD-10-CM | POA: Insufficient documentation

## 2020-03-20 DIAGNOSIS — Z87891 Personal history of nicotine dependence: Secondary | ICD-10-CM | POA: Insufficient documentation

## 2020-03-20 DIAGNOSIS — C20 Malignant neoplasm of rectum: Secondary | ICD-10-CM

## 2020-03-20 DIAGNOSIS — C186 Malignant neoplasm of descending colon: Secondary | ICD-10-CM | POA: Diagnosis not present

## 2020-03-20 DIAGNOSIS — R970 Elevated carcinoembryonic antigen [CEA]: Secondary | ICD-10-CM | POA: Insufficient documentation

## 2020-03-20 DIAGNOSIS — M79604 Pain in right leg: Secondary | ICD-10-CM | POA: Insufficient documentation

## 2020-03-20 LAB — CMP (CANCER CENTER ONLY)
ALT: 7 U/L (ref 0–44)
AST: 11 U/L — ABNORMAL LOW (ref 15–41)
Albumin: 3.3 g/dL — ABNORMAL LOW (ref 3.5–5.0)
Alkaline Phosphatase: 74 U/L (ref 38–126)
Anion gap: 5 (ref 5–15)
BUN: 20 mg/dL (ref 8–23)
CO2: 26 mmol/L (ref 22–32)
Calcium: 9 mg/dL (ref 8.9–10.3)
Chloride: 107 mmol/L (ref 98–111)
Creatinine: 1.28 mg/dL — ABNORMAL HIGH (ref 0.61–1.24)
GFR, Est AFR Am: >60 mL/min (ref >60)
GFR, Estimated: 53 mL/min — ABNORMAL LOW (ref >60)
Glucose, Bld: 105 mg/dL — ABNORMAL HIGH (ref 70–99)
Potassium: 4.4 mmol/L (ref 3.5–5.1)
Sodium: 138 mmol/L (ref 135–145)
Total Bilirubin: 0.3 mg/dL (ref 0.3–1.2)
Total Protein: 6.4 g/dL — ABNORMAL LOW (ref 6.5–8.1)

## 2020-03-20 LAB — CEA (IN HOUSE-CHCC): CEA (CHCC-In House): 52.52 ng/mL — ABNORMAL HIGH (ref 0.00–5.00)

## 2020-03-20 LAB — CBC WITH DIFFERENTIAL (CANCER CENTER ONLY)
Abs Immature Granulocytes: 0.02 10*3/uL (ref 0.00–0.07)
Basophils Absolute: 0 10*3/uL (ref 0.0–0.1)
Basophils Relative: 0 %
Eosinophils Absolute: 0.1 10*3/uL (ref 0.0–0.5)
Eosinophils Relative: 2 %
HCT: 29.9 % — ABNORMAL LOW (ref 39.0–52.0)
Hemoglobin: 9.5 g/dL — ABNORMAL LOW (ref 13.0–17.0)
Immature Granulocytes: 0 %
Lymphocytes Relative: 13 %
Lymphs Abs: 0.7 10*3/uL (ref 0.7–4.0)
MCH: 29.4 pg (ref 26.0–34.0)
MCHC: 31.8 g/dL (ref 30.0–36.0)
MCV: 92.6 fL (ref 80.0–100.0)
Monocytes Absolute: 0.9 10*3/uL (ref 0.1–1.0)
Monocytes Relative: 17 %
Neutro Abs: 3.7 10*3/uL (ref 1.7–7.7)
Neutrophils Relative %: 68 %
Platelet Count: 199 10*3/uL (ref 150–400)
RBC: 3.23 MIL/uL — ABNORMAL LOW (ref 4.22–5.81)
RDW: 22.9 % — ABNORMAL HIGH (ref 11.5–15.5)
WBC Count: 5.4 10*3/uL (ref 4.0–10.5)
nRBC: 0 % (ref 0.0–0.2)

## 2020-03-20 NOTE — Progress Notes (Signed)
Shawn Chambers OFFICE PROGRESS NOTE   Diagnosis: Rectal cancer  INTERVAL HISTORY:   Shawn Chambers returns as scheduled.  He completed cycle 4 Xeloda beginning 03/01/2020.  He denies nausea/vomiting.  No mouth sores.  No diarrhea.  He continues to note a rash over the forearms.  He has intermittent pruritus at the lower legs.   Objective:  Vital signs in last 24 hours:  Blood pressure (!) 145/80, pulse 82, temperature 98.7 F (37.1 C), temperature source Temporal, resp. rate 20, height '5\' 10"'$  (1.778 m), weight 178 lb 8 oz (81 kg), SpO2 99 %.    HEENT: No thrush or ulcers. GI: Abdomen soft and nontender.  No hepatomegaly.  Left lower quadrant colostomy. Vascular: No leg edema. Skin: Palms with mild erythema, dryness.  Erythematous rash over the forearms.  No rash of the lower legs.   Lab Results:  Lab Results  Component Value Date   WBC 5.4 03/20/2020   HGB 9.5 (L) 03/20/2020   HCT 29.9 (L) 03/20/2020   MCV 92.6 03/20/2020   PLT 199 03/20/2020   NEUTROABS 3.7 03/20/2020    Imaging:  No results found.  Medications: I have reviewed the patient's current medications.  Assessment/Plan: 1. Colon cancer, descending colon, stage II (T3 N0), status post a partial left colectomy 03/28/2017 ? MSI-stable, no loss of mismatch repair protein expression ? CT abdomen/pelvis 03/26/2017 and CT chest 03/27/2017-no evidence metastatic disease, abdomen/pelvis CT-long segment of bowel wall thickening in the left colon, partial mechanical obstruction by the area of thickening in the left colon, solid enhancing left renal lesion ? Elevated preoperative CEA ? CT 11/15/2018-irregular rectal wall thickening/perirectal stranding, polypoid defect in the mid transverse colon proximal to the colostomy-indeterminate, nodularity of the prostate with a hyperdense component in the median lobe-stable ? MRI pelvis 02/01/2019-T3BN1 rectal tumor at 4.5 cm from the internal anal sphincter, 11.5  cm in length ? Colonoscopy 02/22/2019-malignant completely obstructing tumor in the proximal rectum. Polyps in the mid and distal rectum; polyps in the ascending, transverse/distal transverse colon. 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 Rectum biopsy shows high-grade dysplasia with focal features suspicious for adenocarcinoma. ? Radiation/Xeloda 03/06/2019-04/12/2019 ? MRI pelvis 08/17/2019-4.8 cm apple core lesion at the rectosigmoid junction, T3B N0; distance from tumor to the internal anal sphincter is 11.5 cm.   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 BPH  4. History of tobacco use  5.History of an irregular mole at the right upper back  6. Clinical stage III rectal cancer ? Colonoscopy 02/22/2019-malignant completely obstructing tumor in the proximal rectum. Polyps in the mid and distal rectum; polyps in the ascending, transverse/distal transverse colon. 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 Rectum biopsy shows high-grade dysplasia with focal features suspicious for adenocarcinoma. ? Radiation/Xeloda 03/06/2019-04/12/2019 ? Flexible sigmoidoscopy 06/11/2019-infiltrative and ulcerated completely obstructing large mass found in the rectosigmoid colon/very proximal rectum. Mass circumferential. No bleeding present. Lesion now less fungating but still large in size. 25 mm polyp found in the proximal rectum. 12 mm polyp found in the distal rectum. ? Radiation/Xeloda 03/06/2019-04/12/2019 ? MRI pelvis 08/17/2019-4.8 cm apple core lesion at the rectosigmoid junction, T3B N0; distance from tumor to the internal anal sphincter is 11.5 cm. ? Low anterior resection and repair of parastomal hernia 10/18/2019,pT4pN1a,tumor involves the visceral peritoneum, adenocarcinoma present at the serosa of the distal resection margin, lymphovascular invasion present, 1/22 lymph nodes positive, multiple tumor deposits;MSI stable ? Cycle  1 adjuvant Xeloda 12/24/2019, took wrong dose and missed  several days, discontinued 01/15/2020 ? Cycle 2 adjuvant Xeloda 01/21/2020 ? Cycle 3 adjuvant Xeloda 02/09/2020 ? Cycle 4 adjuvant Xeloda 03/01/2020  7. 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 Tumor appears to involve superficial submucosa. Tumor present at cauterized resection margin (polyp base). No evidence of lymphovascular invasion. Other polyp showing tubulovillous adenoma with focal high-grade dysplasia.  Disposition: Shawn Chambers has completed 4 cycles of adjuvant Xeloda.  He was referred for CT scans prior to today's visit.  Unfortunately the scans were not completed.  He will have the CT scans on 03/24/2020.  I reviewed the labs from today.  CBC is stable.  The CEA is higher.  Shawn Chambers and his daughter would like to have a phone visit with Dr. Benay Spice next week to review the CT results.  We scheduled a phone visit on 03/25/2020.    [name] ANP/GNP-BC   03/20/2020  2:28 PM

## 2020-03-21 ENCOUNTER — Telehealth: Payer: Self-pay | Admitting: Oncology

## 2020-03-21 NOTE — Telephone Encounter (Signed)
Scheduled per los. Called and left msg  ° °

## 2020-03-24 ENCOUNTER — Telehealth: Payer: Self-pay | Admitting: *Deleted

## 2020-03-24 ENCOUNTER — Ambulatory Visit (HOSPITAL_COMMUNITY): Admission: RE | Admit: 2020-03-24 | Payer: Medicare Other | Source: Ambulatory Visit

## 2020-03-24 NOTE — Telephone Encounter (Signed)
Called daughter and informed her that [name] did not show up for his CT scan today. She was not aware; someone else was supposed to take him today since she could not. Canceled the f/u w/Dr. Benay Spice tomorrow. Provided phone # for radiology central scheduling and she will get it scheduled soon.

## 2020-03-25 ENCOUNTER — Telehealth: Payer: Self-pay | Admitting: *Deleted

## 2020-03-25 ENCOUNTER — Ambulatory Visit: Payer: Medicare Other | Admitting: Oncology

## 2020-03-25 NOTE — Telephone Encounter (Signed)
Left VM w/daughter regarding telephone visit to discuss CT results on 03/27/20 at 3:30pm. Requested return call to confirm or reschedule.

## 2020-03-26 ENCOUNTER — Other Ambulatory Visit: Payer: Self-pay

## 2020-03-26 ENCOUNTER — Encounter (HOSPITAL_COMMUNITY): Payer: Self-pay

## 2020-03-26 ENCOUNTER — Ambulatory Visit (HOSPITAL_COMMUNITY)
Admission: RE | Admit: 2020-03-26 | Discharge: 2020-03-26 | Disposition: A | Discharge status: Home / Self Care | Payer: Medicare Other | Source: Ambulatory Visit | Attending: Oncology | Admitting: Oncology

## 2020-03-26 DIAGNOSIS — C20 Malignant neoplasm of rectum: Secondary | ICD-10-CM | POA: Diagnosis present

## 2020-03-26 MED ORDER — SODIUM CHLORIDE (PF) 0.9 % IJ SOLN
INTRAMUSCULAR | Status: AC
  Filled 2020-03-26: qty 50

## 2020-03-26 MED ORDER — IOHEXOL 300 MG/ML  SOLN
100.0000 mL | Freq: Once | INTRAMUSCULAR | Status: AC | PRN
  Administered 2020-03-26: 100 mL via INTRAVENOUS

## 2020-03-27 ENCOUNTER — Inpatient Hospital Stay (HOSPITAL_BASED_OUTPATIENT_CLINIC_OR_DEPARTMENT_OTHER): Payer: Medicare Other | Admitting: Oncology

## 2020-03-27 DIAGNOSIS — C20 Malignant neoplasm of rectum: Secondary | ICD-10-CM

## 2020-03-27 NOTE — Progress Notes (Signed)
[place] OFFICE VISIT PROGRESS NOTE  I connected with Judith Blonder on 03/27/20 at  [place] EDT by telephone and verified that I am speaking with the correct person using two identifiers.   I discussed the limitations, risks, security and privacy concerns of performing an evaluation and management service by telemedicine and the availability of in-person appointments. I also discussed with the patient that there may be a patient responsible charge related to this service. The patient expressed understanding and agreed to proceed.  Other persons participating in the visit and their role in the encounter: Daughter  Patient's location: Home Provider's location: Office   Diagnosis: Rectal cancer  INTERVAL HISTORY:   Mr. Neeson was scheduled for a telehealth visit today.  This is secondary to distancing with the Covid pandemic and transportation issues.  Mr. Beltran was not present for the visit.  His daughter reports he left when I was late calling.  She reports he is unable to hear conversation on the telephone. Objective:   Lab Results:  Lab Results  Component Value Date   WBC 5.4 03/20/2020   HGB 9.5 (L) 03/20/2020   HCT 29.9 (L) 03/20/2020   MCV 92.6 03/20/2020   PLT 199 03/20/2020   NEUTROABS 3.7 03/20/2020    Imaging:  CT CHEST W CONTRAST  Result Date: 03/26/2020 CLINICAL DATA:  History of rectal and renal carcinoma. Chemotherapy for rectal cancer. Occasional abdominopelvic pain and blood in stool. Restaging of rectal cancer with rising CEA. Status post low anterior resection of rectal cancer on 10/18/2019. EXAM: CT CHEST, ABDOMEN, AND PELVIS WITH CONTRAST TECHNIQUE: Multidetector CT imaging of the chest, abdomen and pelvis was performed following the standard protocol during bolus administration of intravenous contrast. CONTRAST:  130m OMNIPAQUE IOHEXOL 300 MG/ML  SOLN COMPARISON:  08/17/2019 rectal MRI.  01/23/2018  CTs. FINDINGS: CT CHEST FINDINGS Cardiovascular: Aortic atherosclerosis. Upper normal ascending aortic size, including at 3.9 cm. Tortuous thoracic aorta. Mild cardiomegaly, without pericardial effusion. Multivessel coronary artery atherosclerosis. No central pulmonary embolism, on this non-dedicated study. Pulmonary artery enlargement, outflow tract 3.2 cm Mediastinum/Nodes: No supraclavicular adenopathy. No mediastinal or hilar adenopathy. Lungs/Pleura: No pleural fluid.  Clear lungs. Musculoskeletal: No acute osseous abnormality. CT ABDOMEN PELVIS FINDINGS Hepatobiliary: Old granulomatous disease in the liver. Normal gallbladder, without biliary ductal dilatation. Pancreas: Normal, without mass or ductal dilatation. Spleen: Normal in size, without focal abnormality. Adrenals/Urinary Tract: Normal adrenal glands. Left nephrectomy. Right renal too small to characterize lesions. No hydronephrosis. The bladder is moderately thick walled with surrounding edema. Stomach/Bowel: Normal stomach, without wall thickening. Left abdominal distal transverse colostomy. The remaining rectum is moderately thick walled, including on 108/2. At the proximal most portion of the rectum, there is infiltrative soft tissue density including at 3.6 x 5.6 cm on 103/2. This is intimately associated with the seminal vesicles, including on 106/2. Contiguous, more amorphous presacral soft tissue including on 98/2 is new since the prior CT. Small bowel loops are normal in caliber. Small bowel contacts the presacral soft tissue thickening, including on 101/2. Vascular/Lymphatic: Aortic atherosclerosis. Infrarenal aortic ectasia at 3.9 x 3.5 cm is similar. No extension into the iliacs. Aortocaval node measures 8 mm on 70/2 versus 2 mm on the prior CT. More cephalad aortocaval node of 8 mm on 67/2 has also enlarged from approximately 2 mm on the prior. No pelvic sidewall adenopathy. Reproductive: Mild prostatomegaly. Seminal vesicle contact with  suspicious soft tissue density in the pelvis. Median lobe prostate impression  into the urinary bladder. Other: Small fat containing left inguinal hernia. No evidence of omental or peritoneal disease. Musculoskeletal: Left acetabular sub subcentimeter sclerotic lesion is likely a bone island and is unchanged. IMPRESSION: 1. Interval low anterior resection of rectal cancer. Wall thickening within the remaining rectum with soft tissue mass at the level of the superior most rectum. Highly suspicious for locally advanced, residual/recurrent disease. Infectious etiology felt less likely. More amorphous presacral soft tissue thickening could be treatment related, but is nonspecific. Consider PET to direct sampling. 2. Developing retroperitoneal adenopathy, highly suspicious for nodal metastasis. 3. Bladder wall thickening and pericystic edema, suspicious for cystitis. Correlate with pelvic radiation therapy. 4.  No acute process or evidence of metastatic disease in the chest. 5. Aortic atherosclerosis (ICD10-I70.0) and emphysema (ICD10-J43.9). 6. Pulmonary artery enlargement suggests pulmonary arterial hypertension. Electronically Signed   By: Abigail Miyamoto M.D.   On: 03/26/2020 12:31   CT ABDOMEN PELVIS W CONTRAST  Result Date: 03/26/2020 CLINICAL DATA:  History of rectal and renal carcinoma. Chemotherapy for rectal cancer. Occasional abdominopelvic pain and blood in stool. Restaging of rectal cancer with rising CEA. Status post low anterior resection of rectal cancer on 10/18/2019. EXAM: CT CHEST, ABDOMEN, AND PELVIS WITH CONTRAST TECHNIQUE: Multidetector CT imaging of the chest, abdomen and pelvis was performed following the standard protocol during bolus administration of intravenous contrast. CONTRAST:  145m OMNIPAQUE IOHEXOL 300 MG/ML  SOLN COMPARISON:  08/17/2019 rectal MRI.  01/23/2018 CTs. FINDINGS: CT CHEST FINDINGS Cardiovascular: Aortic atherosclerosis. Upper normal ascending aortic size, including at 3.9  cm. Tortuous thoracic aorta. Mild cardiomegaly, without pericardial effusion. Multivessel coronary artery atherosclerosis. No central pulmonary embolism, on this non-dedicated study. Pulmonary artery enlargement, outflow tract 3.2 cm Mediastinum/Nodes: No supraclavicular adenopathy. No mediastinal or hilar adenopathy. Lungs/Pleura: No pleural fluid.  Clear lungs. Musculoskeletal: No acute osseous abnormality. CT ABDOMEN PELVIS FINDINGS Hepatobiliary: Old granulomatous disease in the liver. Normal gallbladder, without biliary ductal dilatation. Pancreas: Normal, without mass or ductal dilatation. Spleen: Normal in size, without focal abnormality. Adrenals/Urinary Tract: Normal adrenal glands. Left nephrectomy. Right renal too small to characterize lesions. No hydronephrosis. The bladder is moderately thick walled with surrounding edema. Stomach/Bowel: Normal stomach, without wall thickening. Left abdominal distal transverse colostomy. The remaining rectum is moderately thick walled, including on 108/2. At the proximal most portion of the rectum, there is infiltrative soft tissue density including at 3.6 x 5.6 cm on 103/2. This is intimately associated with the seminal vesicles, including on 106/2. Contiguous, more amorphous presacral soft tissue including on 98/2 is new since the prior CT. Small bowel loops are normal in caliber. Small bowel contacts the presacral soft tissue thickening, including on 101/2. Vascular/Lymphatic: Aortic atherosclerosis. Infrarenal aortic ectasia at 3.9 x 3.5 cm is similar. No extension into the iliacs. Aortocaval node measures 8 mm on 70/2 versus 2 mm on the prior CT. More cephalad aortocaval node of 8 mm on 67/2 has also enlarged from approximately 2 mm on the prior. No pelvic sidewall adenopathy. Reproductive: Mild prostatomegaly. Seminal vesicle contact with suspicious soft tissue density in the pelvis. Median lobe prostate impression into the urinary bladder. Other: Small fat  containing left inguinal hernia. No evidence of omental or peritoneal disease. Musculoskeletal: Left acetabular sub subcentimeter sclerotic lesion is likely a bone island and is unchanged. IMPRESSION: 1. Interval low anterior resection of rectal cancer. Wall thickening within the remaining rectum with soft tissue mass at the level of the superior most rectum. Highly suspicious for locally advanced, residual/recurrent disease.  Infectious etiology felt less likely. More amorphous presacral soft tissue thickening could be treatment related, but is nonspecific. Consider PET to direct sampling. 2. Developing retroperitoneal adenopathy, highly suspicious for nodal metastasis. 3. Bladder wall thickening and pericystic edema, suspicious for cystitis. Correlate with pelvic radiation therapy. 4.  No acute process or evidence of metastatic disease in the chest. 5. Aortic atherosclerosis (ICD10-I70.0) and emphysema (ICD10-J43.9). 6. Pulmonary artery enlargement suggests pulmonary arterial hypertension. Electronically Signed   By: Abigail Miyamoto M.D.   On: 03/26/2020 12:31    Medications: I have reviewed the patient's current medications.  Assessment/Plan: 1. Colon cancer, descending colon, stage II (T3 N0), status post a partial left colectomy 03/28/2017 ? MSI-stable, no loss of mismatch repair protein expression ? CT abdomen/pelvis 03/26/2017 and CT chest 03/27/2017-no evidence metastatic disease, abdomen/pelvis CT-long segment of bowel wall thickening in the left colon, partial mechanical obstruction by the area of thickening in the left colon, solid enhancing left renal lesion ? Elevated preoperative CEA ? CT 11/15/2018-irregular rectal wall thickening/perirectal stranding, polypoid defect in the mid transverse colon proximal to the colostomy-indeterminate, nodularity of the prostate with a hyperdense component in the median lobe-stable ? MRI pelvis 02/01/2019-T3BN1 rectal tumor at 4.5 cm from the internal anal  sphincter, 11.5 cm in length ? Colonoscopy 02/22/2019-malignant completely obstructing tumor in the proximal rectum. Polyps in the mid and distal rectum; polyps in the ascending, transverse/distal transverse colon. 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 Rectum biopsy shows high-grade dysplasia with focal features suspicious for adenocarcinoma. ? Radiation/Xeloda 03/06/2019-04/12/2019 ? MRI pelvis 08/17/2019-4.8 cm apple core lesion at the rectosigmoid junction, T3B N0; distance from tumor to the internal anal sphincter is 11.5 cm.   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 BPH  4. History of tobacco use  5.History of an irregular mole at the right upper back  6. Clinical stage III rectal cancer ? Colonoscopy 02/22/2019-malignant completely obstructing tumor in the proximal rectum. Polyps in the mid and distal rectum; polyps in the ascending, transverse/distal transverse colon. 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 Rectum biopsy shows high-grade dysplasia with focal features suspicious for adenocarcinoma. ? Radiation/Xeloda 03/06/2019-04/12/2019 ? Flexible sigmoidoscopy 06/11/2019-infiltrative and ulcerated completely obstructing large mass found in the rectosigmoid colon/very proximal rectum. Mass circumferential. No bleeding present. Lesion now less fungating but still large in size. 25 mm polyp found in the proximal rectum. 12 mm polyp found in the distal rectum. ? Radiation/Xeloda 03/06/2019-04/12/2019 ? MRI pelvis 08/17/2019-4.8 cm apple core lesion at the rectosigmoid junction, T3B N0; distance from tumor to the internal anal sphincter is 11.5 cm. ? Low anterior resection and repair of parastomal hernia 10/18/2019,pT4pN1a,tumor involves the visceral peritoneum, adenocarcinoma present at the serosa of the distal resection margin, lymphovascular invasion present, 1/22 lymph nodes positive, multiple tumor deposits;MSI  stable ? Cycle 1 adjuvant Xeloda 12/24/2019, took wrong dose and missed several days, discontinued 01/15/2020 ? Cycle 2 adjuvant Xeloda 01/21/2020 ? Cycle 3 adjuvant Xeloda2/27/2021 ? Cycle 4 adjuvant Xeloda 03/01/2020 ? CTs 03/26/2020-wall thickening in the remaining rectum with a soft tissue mass at the superior rectum, enlarged retroperitoneal lymph nodes, bladder wall thickening, no evidence of metastatic disease in the chest  7. 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 Tumor appears to involve superficial submucosa. Tumor present at cauterized resection margin (polyp base). No evidence of lymphovascular invasion. Other polyp showing tubulovillous adenoma with focal high-grade dysplasia.   Disposition: Mr. Halpin has a history  of colon and rectal cancer.  He began treatment with adjuvant Xeloda in January 2021 and completed a fourth cycle beginning 03/01/2020.  The CEA has been rising over the past few months.  The restaging CT is consistent with progressive rectal cancer.  There is a pelvic/rectal mass and enlarged retroperitoneal lymph nodes.  I discussed the CT findings with his daughter.  I reviewed the CT images.  I explained the CT appearance is consistent with recurrence of rectal cancer.  I explained no therapy will be curative.  We discussed treatment options including comfort care versus a trial of salvage systemic therapy.  Ms. Garnet Koyanagi would like to bring Mr. Azam in next week to discuss treatment options.  Treatment will require placement of a Port-A-Cath and intravenous chemotherapy.  I recommend FOLFOX.  We will contact his daughter to schedule an appointment for next week.      I provided 20 minutes of telephone, chart review, and documentation time during this encounter, and > 50% was spent counseling as documented under my assessment & plan.  Betsy Coder  ANP/GNP-BC   03/27/2020 4:00 PM

## 2020-04-01 ENCOUNTER — Encounter: Payer: Self-pay | Admitting: *Deleted

## 2020-04-01 ENCOUNTER — Inpatient Hospital Stay (HOSPITAL_BASED_OUTPATIENT_CLINIC_OR_DEPARTMENT_OTHER): Payer: Medicare Other | Admitting: Oncology

## 2020-04-01 ENCOUNTER — Other Ambulatory Visit: Payer: Self-pay

## 2020-04-01 VITALS — BP 132/67 | HR 68 | Temp 98.2°F | Resp 18 | Ht 70.0 in | Wt 175.4 lb

## 2020-04-01 DIAGNOSIS — Z7189 Other specified counseling: Secondary | ICD-10-CM

## 2020-04-01 DIAGNOSIS — C7A8 Other malignant neuroendocrine tumors: Secondary | ICD-10-CM | POA: Diagnosis not present

## 2020-04-01 DIAGNOSIS — C20 Malignant neoplasm of rectum: Secondary | ICD-10-CM

## 2020-04-01 DIAGNOSIS — C186 Malignant neoplasm of descending colon: Secondary | ICD-10-CM

## 2020-04-01 MED ORDER — CAPECITABINE 500 MG PO TABS: 1500.0000 mg | ORAL_TABLET | Freq: Two times a day (BID) | ORAL | 0 refills | Status: DC

## 2020-04-01 NOTE — Progress Notes (Signed)
Email to [facility] requesting [facility] testing on case #[id number] dated 10/18/19 per Dr. Gearldine Shown request. Dx: C20 Stage IV

## 2020-04-01 NOTE — Progress Notes (Signed)
START ON PATHWAY REGIMEN - Colorectal     A cycle is every 21 days:     Capecitabine      Oxaliplatin   **Always confirm dose/schedule in your pharmacy ordering system**  Patient Characteristics: Distant Metastases, Nonsurgical Candidate, KRAS/NRAS Mutation Positive/Unknown (BRAF V600 Wild-Type/Unknown), Standard Cytotoxic Therapy, First Line Standard Cytotoxic Therapy, Bevacizumab Ineligible, PS = 0,1 Tumor Location: Rectal Therapeutic Status: Distant Metastases Microsatellite/Mismatch Repair Status: MSS/pMMR BRAF Mutation Status: Awaiting Test Results KRAS/NRAS Mutation Status: Awaiting Test Results Standard Cytotoxic Line of Therapy: First Line Standard Cytotoxic Therapy ECOG Performance Status: 0 Bevacizumab Eligibility: Ineligible Intent of Therapy: Non-Curative / Palliative Intent, Discussed with Patient

## 2020-04-01 NOTE — Progress Notes (Signed)
Provided patient printed information on PAC and FOLFOX medications. Showed him the portable infusion pump and fanny pack it goes in to wear for 2 days. He stated "I can't do that". Was decided to proceed w/CAPOX regimen. Showed patient the port and he was upset initially, but is finally comfortable with the port after explaining that peripheral oxaliplatin can be very painful to infuse. IR will place port.

## 2020-04-01 NOTE — Progress Notes (Signed)
[place] OFFICE PROGRESS NOTE   Diagnosis: Rectal cancer  INTERVAL HISTORY:   Mr. Apodaca returns for a scheduled visit.  He is here with his daughter.  He feels well.  The colostomy is functioning well.  He continues to have drainage from the rectum.  He has bilateral leg pain.  He reports this has been present for many years and he feels this is unrelated to the cancer diagnosis.  Objective:  Vital signs in last 24 hours:  Blood pressure 132/67, pulse 68, temperature 98.2 F (36.8 C), temperature source Temporal, resp. rate 18, height _0  (1.778 m), weight 175 lb 6.4 oz (79.6 kg), SpO2 100 %. GI: No hepatosplenomegaly, no mass, nontender, left lower quadrant colostomy Vascular: No leg edema Skin: Palms without erythema    Lab Results:  Lab Results  Component Value Date   WBC 5.4 03/20/2020   HGB 9.5 (L) 03/20/2020   HCT 29.9 (L) 03/20/2020   MCV 92.6 03/20/2020   PLT 199 03/20/2020   NEUTROABS 3.7 03/20/2020    CMP  Lab Results  Component Value Date   NA 138 03/20/2020   K 4.4 03/20/2020   CL 107 03/20/2020   CO2 26 03/20/2020   GLUCOSE 105 (H) 03/20/2020   BUN 20 03/20/2020   CREATININE 1.28 (H) 03/20/2020   CALCIUM 9.0 03/20/2020   PROT 6.4 (L) 03/20/2020   ALBUMIN 3.3 (L) 03/20/2020   AST 11 (L) 03/20/2020   ALT 7 03/20/2020   ALKPHOS 74 03/20/2020   BILITOT 0.3 03/20/2020   GFRNONAA 53 (L) 03/20/2020   GFRAA >60 03/20/2020    Lab Results  Component Value Date   CEA1 52.52 (H) 03/20/2020     Imaging: CT images from 03/27/2020 reviewed   Medications: I have reviewed the patient's current medications.   Assessment/Plan: 1. Colon cancer, descending colon, stage II (T3 N0), status post a partial left colectomy 03/28/2017 ? MSI-stable, no loss of mismatch repair protein expression ? CT abdomen/pelvis 03/26/2017 and CT chest 03/27/2017-no evidence metastatic disease, abdomen/pelvis CT-long segment of bowel wall thickening in  the left colon, partial mechanical obstruction by the area of thickening in the left colon, solid enhancing left renal lesion ? Elevated preoperative CEA ? CT 11/15/2018-irregular rectal wall thickening/perirectal stranding, polypoid defect in the mid transverse colon proximal to the colostomy-indeterminate, nodularity of the prostate with a hyperdense component in the median lobe-stable ? MRI pelvis 02/01/2019-T3BN1 rectal tumor at 4.5 cm from the internal anal sphincter, 11.5 cm in length ? Colonoscopy 02/22/2019-malignant completely obstructing tumor in the proximal rectum. Polyps in the mid and distal rectum; polyps in the ascending, transverse/distal transverse colon. 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 Rectum biopsy shows high-grade dysplasia with focal features suspicious for adenocarcinoma. ? Radiation/Xeloda 03/06/2019-04/12/2019 ? MRI pelvis 08/17/2019-4.8 cm apple core lesion at the rectosigmoid junction, T3B N0; distance from tumor to the internal anal sphincter is 11.5 cm.   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 BPH  4. History of tobacco use  5.History of an irregular mole at the right upper back  6. Clinical stage III rectal cancer ? Colonoscopy 02/22/2019-malignant completely obstructing tumor in the proximal rectum. Polyps in the mid and distal rectum; polyps in the ascending, transverse/distal transverse colon. 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 Rectum biopsy shows high-grade dysplasia with focal features suspicious for adenocarcinoma. ? Radiation/Xeloda 03/06/2019-04/12/2019 ? Flexible sigmoidoscopy 06/11/2019-infiltrative and ulcerated completely obstructing large mass found in the rectosigmoid colon/very proximal rectum.  Mass circumferential. No bleeding present. Lesion now less fungating but still large in size. 25 mm polyp found in the proximal rectum. 12 mm polyp found in the distal  rectum. ? Radiation/Xeloda 03/06/2019-04/12/2019 ? MRI pelvis 08/17/2019-4.8 cm apple core lesion at the rectosigmoid junction, T3B N0; distance from tumor to the internal anal sphincter is 11.5 cm. ? Low anterior resection and repair of parastomal hernia 10/18/2019,pT4pN1a,tumor involves the visceral peritoneum, adenocarcinoma present at the serosa of the distal resection margin, lymphovascular invasion present, 1/22 lymph nodes positive, multiple tumor deposits;MSI stable ? Cycle 1 adjuvant Xeloda 12/24/2019, took wrong dose and missed several days, discontinued 01/15/2020 ? Cycle 2 adjuvant Xeloda 01/21/2020 ? Cycle 3 adjuvant Xeloda2/27/2021 ? Cycle 4 adjuvant Xeloda 03/01/2020 ? CTs 03/26/2020-wall thickening in the remaining rectum with a soft tissue mass at the superior rectum, enlarged retroperitoneal lymph nodes, bladder wall thickening, no evidence of metastatic disease in the chest  7. 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 Tumor appears to involve superficial submucosa. Tumor present at cauterized resection margin (polyp base). No evidence of lymphovascular invasion. Other polyp showing tubulovillous adenoma with focal high-grade dysplasia.    Disposition: Mr. Joslyn has a history of colon and rectal cancer.  The CEA is higher and the CTs last week are consistent with progressive disease at the rectal anastomosis and involving retroperitoneal lymph nodes.  I discussed the CT findings and treatment options with Mr. Wenke and his daughter.  I explained no therapy will be curative.  He has been treated with single agent capecitabine since January of this year.  I recommend FOLFOX chemotherapy.  We reviewed potential toxicities associated with the FOLFOX regimen.  Mr. Kossman indicated he will not agree to treatment with an infusion pump.  We discussed Capox.  I explained  the increased potential for diarrhea, hand/foot syndrome, and neuropathy symptoms with the CAPOX compared to FOLFOX regimen.  He would like to proceed with a trial of CAPOX.  We reviewed potential toxicities associated with this regimen including the chance for mucositis, diarrhea, nausea/vomiting, alopecia, and hematologic toxicity.  We discussed the allergic reaction and various types of neuropathy associated with oxaliplatin.  He agrees to proceed.  Mr. Slager will be referred for Port-A-Cath placement prior to beginning Capox on 04/10/2020.  A chemotherapy plan was entered today.  Betsy Coder, MD  04/01/2020  1:58 PM

## 2020-04-01 NOTE — Patient Instructions (Signed)
Will begin regimen CAPOX:   Oxaliplatin every 3 weeks via port and Xeloda 1500 mg twice daily for 2 weeks on/ 1 week off The Xeloda will begin on the day of your IV chemo treatment. Arrangements will be made for an education class session and the port placement.

## 2020-04-02 ENCOUNTER — Telehealth: Payer: Self-pay | Admitting: Oncology

## 2020-04-02 ENCOUNTER — Telehealth: Payer: Self-pay | Admitting: *Deleted

## 2020-04-02 NOTE — Telephone Encounter (Signed)
Scheduled per los. Called and left msg  ° °

## 2020-04-02 NOTE — Telephone Encounter (Signed)
Provided daughter the appointment for chemo education session for 04/07/20 at 10:00.

## 2020-04-03 ENCOUNTER — Other Ambulatory Visit: Payer: Self-pay | Admitting: Oncology

## 2020-04-07 ENCOUNTER — Other Ambulatory Visit: Payer: Self-pay

## 2020-04-07 ENCOUNTER — Other Ambulatory Visit: Payer: Self-pay | Admitting: *Deleted

## 2020-04-07 ENCOUNTER — Ambulatory Visit (HOSPITAL_COMMUNITY): Payer: Medicare Other

## 2020-04-07 ENCOUNTER — Inpatient Hospital Stay: Payer: Medicare Other

## 2020-04-07 MED ORDER — LIDOCAINE-PRILOCAINE 2.5-2.5 % EX CREA: 1.0000 "application " | TOPICAL_CREAM | CUTANEOUS | 0 refills | Status: DC

## 2020-04-07 MED ORDER — PROCHLORPERAZINE MALEATE 10 MG PO TABS: 10.0000 mg | ORAL_TABLET | Freq: Four times a day (QID) | ORAL | 1 refills | Status: DC | PRN

## 2020-04-07 NOTE — Progress Notes (Signed)
EMLA and antiemetic sent to pharmacy at request of education nurse.

## 2020-04-11 ENCOUNTER — Other Ambulatory Visit: Payer: Medicare Other

## 2020-04-11 ENCOUNTER — Inpatient Hospital Stay: Payer: Medicare Other | Admitting: Nurse Practitioner

## 2020-04-11 ENCOUNTER — Inpatient Hospital Stay: Payer: Medicare Other

## 2020-04-11 ENCOUNTER — Ambulatory Visit: Payer: Medicare Other

## 2020-04-15 ENCOUNTER — Telehealth: Payer: Self-pay | Admitting: Oncology

## 2020-04-15 NOTE — Telephone Encounter (Signed)
Scheduled appt per 5/4 sch message - unable to reach pt daughter. Left message with appt date and time

## 2020-04-21 ENCOUNTER — Telehealth: Payer: Self-pay | Admitting: *Deleted

## 2020-04-21 NOTE — Telephone Encounter (Signed)
Reports her father has developed yellow stool from ostomy and also reports constipation--is not consistently taking his miralax or colace. Having firmness behind the ostomy opening and some abdominal pain.  Informed her it could be stool trying to come. He needs to take his Miralax and #2 colace this evening. Go to ER if he develops N/V or severe abdominal pain. Keep appointment tomorrow.

## 2020-04-22 ENCOUNTER — Inpatient Hospital Stay: Payer: Medicare Other | Attending: Oncology | Admitting: Oncology

## 2020-04-22 ENCOUNTER — Inpatient Hospital Stay (HOSPITAL_COMMUNITY): Payer: Medicare Other

## 2020-04-22 ENCOUNTER — Other Ambulatory Visit: Payer: Self-pay

## 2020-04-22 ENCOUNTER — Encounter (HOSPITAL_COMMUNITY): Payer: Self-pay | Admitting: Family Medicine

## 2020-04-22 ENCOUNTER — Inpatient Hospital Stay (HOSPITAL_COMMUNITY)
Admission: AD | Admit: 2020-04-22 | Discharge: 2020-04-25 | DRG: 375 | Disposition: A | Discharge status: Hospice | Payer: Medicare Other | Source: Ambulatory Visit | Attending: Internal Medicine | Admitting: Internal Medicine

## 2020-04-22 ENCOUNTER — Ambulatory Visit (HOSPITAL_COMMUNITY)
Admission: RE | Admit: 2020-04-22 | Discharge: 2020-04-22 | Disposition: A | Discharge status: Home / Self Care | Payer: Medicare Other | Source: Ambulatory Visit | Attending: Oncology | Admitting: Oncology

## 2020-04-22 VITALS — BP 121/57 | HR 77 | Temp 98.5°F | Resp 17 | Ht 70.0 in | Wt 176.9 lb

## 2020-04-22 DIAGNOSIS — Z9221 Personal history of antineoplastic chemotherapy: Secondary | ICD-10-CM | POA: Diagnosis not present

## 2020-04-22 DIAGNOSIS — Z66 Do not resuscitate: Secondary | ICD-10-CM | POA: Diagnosis not present

## 2020-04-22 DIAGNOSIS — Z79899 Other long term (current) drug therapy: Secondary | ICD-10-CM

## 2020-04-22 DIAGNOSIS — N183 Chronic kidney disease, stage 3 unspecified: Secondary | ICD-10-CM | POA: Diagnosis present

## 2020-04-22 DIAGNOSIS — C785 Secondary malignant neoplasm of large intestine and rectum: Secondary | ICD-10-CM | POA: Diagnosis present

## 2020-04-22 DIAGNOSIS — H409 Unspecified glaucoma: Secondary | ICD-10-CM | POA: Diagnosis present

## 2020-04-22 DIAGNOSIS — Z4659 Encounter for fitting and adjustment of other gastrointestinal appliance and device: Secondary | ICD-10-CM

## 2020-04-22 DIAGNOSIS — K219 Gastro-esophageal reflux disease without esophagitis: Secondary | ICD-10-CM | POA: Diagnosis present

## 2020-04-22 DIAGNOSIS — Z5320 Procedure and treatment not carried out because of patient's decision for unspecified reasons: Secondary | ICD-10-CM

## 2020-04-22 DIAGNOSIS — N1330 Unspecified hydronephrosis: Secondary | ICD-10-CM | POA: Diagnosis not present

## 2020-04-22 DIAGNOSIS — C20 Malignant neoplasm of rectum: Secondary | ICD-10-CM | POA: Diagnosis present

## 2020-04-22 DIAGNOSIS — K5669 Other partial intestinal obstruction: Secondary | ICD-10-CM | POA: Diagnosis present

## 2020-04-22 DIAGNOSIS — K5909 Other constipation: Secondary | ICD-10-CM | POA: Diagnosis present

## 2020-04-22 DIAGNOSIS — C186 Malignant neoplasm of descending colon: Secondary | ICD-10-CM | POA: Insufficient documentation

## 2020-04-22 DIAGNOSIS — Z800 Family history of malignant neoplasm of digestive organs: Secondary | ICD-10-CM

## 2020-04-22 DIAGNOSIS — R109 Unspecified abdominal pain: Secondary | ICD-10-CM

## 2020-04-22 DIAGNOSIS — Z85038 Personal history of other malignant neoplasm of large intestine: Secondary | ICD-10-CM | POA: Diagnosis not present

## 2020-04-22 DIAGNOSIS — K56609 Unspecified intestinal obstruction, unspecified as to partial versus complete obstruction: Secondary | ICD-10-CM | POA: Diagnosis present

## 2020-04-22 DIAGNOSIS — N1831 Chronic kidney disease, stage 3a: Secondary | ICD-10-CM | POA: Diagnosis present

## 2020-04-22 DIAGNOSIS — D638 Anemia in other chronic diseases classified elsewhere: Secondary | ICD-10-CM | POA: Diagnosis present

## 2020-04-22 DIAGNOSIS — K435 Parastomal hernia without obstruction or  gangrene: Secondary | ICD-10-CM | POA: Diagnosis present

## 2020-04-22 DIAGNOSIS — Z923 Personal history of irradiation: Secondary | ICD-10-CM

## 2020-04-22 DIAGNOSIS — C775 Secondary and unspecified malignant neoplasm of intrapelvic lymph nodes: Secondary | ICD-10-CM | POA: Diagnosis not present

## 2020-04-22 DIAGNOSIS — Z20822 Contact with and (suspected) exposure to covid-19: Secondary | ICD-10-CM | POA: Diagnosis present

## 2020-04-22 DIAGNOSIS — E871 Hypo-osmolality and hyponatremia: Secondary | ICD-10-CM | POA: Diagnosis present

## 2020-04-22 DIAGNOSIS — Z515 Encounter for palliative care: Secondary | ICD-10-CM | POA: Diagnosis not present

## 2020-04-22 DIAGNOSIS — N400 Enlarged prostate without lower urinary tract symptoms: Secondary | ICD-10-CM | POA: Diagnosis present

## 2020-04-22 DIAGNOSIS — Z0189 Encounter for other specified special examinations: Secondary | ICD-10-CM

## 2020-04-22 DIAGNOSIS — Z91199 Patient's noncompliance with other medical treatment and regimen due to unspecified reason: Secondary | ICD-10-CM

## 2020-04-22 DIAGNOSIS — H9190 Unspecified hearing loss, unspecified ear: Secondary | ICD-10-CM | POA: Diagnosis present

## 2020-04-22 DIAGNOSIS — N1339 Other hydronephrosis: Secondary | ICD-10-CM | POA: Diagnosis present

## 2020-04-22 DIAGNOSIS — Z9119 Patient's noncompliance with other medical treatment and regimen: Secondary | ICD-10-CM

## 2020-04-22 DIAGNOSIS — Z87891 Personal history of nicotine dependence: Secondary | ICD-10-CM

## 2020-04-22 DIAGNOSIS — H9193 Unspecified hearing loss, bilateral: Secondary | ICD-10-CM | POA: Diagnosis not present

## 2020-04-22 DIAGNOSIS — Z9049 Acquired absence of other specified parts of digestive tract: Secondary | ICD-10-CM | POA: Diagnosis not present

## 2020-04-22 DIAGNOSIS — Z933 Colostomy status: Secondary | ICD-10-CM

## 2020-04-22 DIAGNOSIS — D631 Anemia in chronic kidney disease: Secondary | ICD-10-CM | POA: Diagnosis present

## 2020-04-22 DIAGNOSIS — Z905 Acquired absence of kidney: Secondary | ICD-10-CM

## 2020-04-22 DIAGNOSIS — Z9111 Patient's noncompliance with dietary regimen: Secondary | ICD-10-CM

## 2020-04-22 DIAGNOSIS — Z8371 Family history of colonic polyps: Secondary | ICD-10-CM

## 2020-04-22 LAB — BASIC METABOLIC PANEL
Anion gap: 12 (ref 5–15)
BUN: 30 mg/dL — ABNORMAL HIGH (ref 8–23)
CO2: 24 mmol/L (ref 22–32)
Calcium: 8.8 mg/dL — ABNORMAL LOW (ref 8.9–10.3)
Chloride: 97 mmol/L — ABNORMAL LOW (ref 98–111)
Creatinine, Ser: 1.49 mg/dL — ABNORMAL HIGH (ref 0.61–1.24)
GFR calc Af Amer: 51 mL/min — ABNORMAL LOW (ref >60)
GFR calc non Af Amer: 44 mL/min — ABNORMAL LOW (ref >60)
Glucose, Bld: 115 mg/dL — ABNORMAL HIGH (ref 70–99)
Potassium: 4.1 mmol/L (ref 3.5–5.1)
Sodium: 133 mmol/L — ABNORMAL LOW (ref 135–145)

## 2020-04-22 LAB — CBC
HCT: 29.8 % — ABNORMAL LOW (ref 39.0–52.0)
Hemoglobin: 9.6 g/dL — ABNORMAL LOW (ref 13.0–17.0)
MCH: 29.1 pg (ref 26.0–34.0)
MCHC: 32.2 g/dL (ref 30.0–36.0)
MCV: 90.3 fL (ref 80.0–100.0)
Platelets: 342 10*3/uL (ref 150–400)
RBC: 3.3 MIL/uL — ABNORMAL LOW (ref 4.22–5.81)
RDW: 17.4 % — ABNORMAL HIGH (ref 11.5–15.5)
WBC: 12.8 10*3/uL — ABNORMAL HIGH (ref 4.0–10.5)
nRBC: 0 % (ref 0.0–0.2)

## 2020-04-22 LAB — TSH: TSH: 1.02 u[IU]/mL (ref 0.350–4.500)

## 2020-04-22 LAB — PROTIME-INR
INR: 1.2 (ref 0.8–1.2)
Prothrombin Time: 15.1 seconds (ref 11.4–15.2)

## 2020-04-22 LAB — MAGNESIUM: Magnesium: 2.2 mg/dL (ref 1.7–2.4)

## 2020-04-22 LAB — APTT: aPTT: 37 seconds — ABNORMAL HIGH (ref 24–36)

## 2020-04-22 MED ORDER — IOHEXOL 9 MG/ML PO SOLN
500.0000 mL | ORAL | Status: AC
  Administered 2020-04-22: 500 mL via ORAL

## 2020-04-22 MED ORDER — MENTHOL 3 MG MT LOZG
1.0000 | LOZENGE | OROMUCOSAL | Status: DC | PRN
  Filled 2020-04-22: qty 9

## 2020-04-22 MED ORDER — ALUM & MAG HYDROXIDE-SIMETH 200-200-20 MG/5ML PO SUSP: 30.0000 mL | Freq: Four times a day (QID) | ORAL | Status: DC | PRN

## 2020-04-22 MED ORDER — ONDANSETRON HCL 4 MG PO TABS: 4.0000 mg | ORAL_TABLET | Freq: Four times a day (QID) | ORAL | Status: DC | PRN

## 2020-04-22 MED ORDER — ENOXAPARIN SODIUM 40 MG/0.4ML ~~LOC~~ SOLN: 40.0000 mg | SUBCUTANEOUS | Status: DC

## 2020-04-22 MED ORDER — ONDANSETRON HCL 4 MG/2ML IJ SOLN: 4.0000 mg | Freq: Four times a day (QID) | INTRAMUSCULAR | Status: DC | PRN

## 2020-04-22 MED ORDER — LIP MEDEX EX OINT
1.0000 "application " | TOPICAL_OINTMENT | Freq: Two times a day (BID) | CUTANEOUS | Status: DC
  Administered 2020-04-22 – 2020-04-24 (×5): 1 via TOPICAL
  Filled 2020-04-22 (×2): qty 7

## 2020-04-22 MED ORDER — LATANOPROST 0.005 % OP SOLN
1.0000 [drp] | Freq: Every day | OPHTHALMIC | Status: DC
  Administered 2020-04-22 – 2020-04-24 (×3): 1 [drp] via OPHTHALMIC
  Filled 2020-04-22: qty 2.5

## 2020-04-22 MED ORDER — PHENOL 1.4 % MT LIQD
2.0000 | OROMUCOSAL | Status: DC | PRN
  Filled 2020-04-22 (×2): qty 177

## 2020-04-22 MED ORDER — ACETAMINOPHEN 650 MG RE SUPP: 650.0000 mg | Freq: Four times a day (QID) | RECTAL | Status: DC | PRN

## 2020-04-22 MED ORDER — SODIUM CHLORIDE 0.9 % IV SOLN: INTRAVENOUS | Status: DC

## 2020-04-22 MED ORDER — TAMSULOSIN HCL 0.4 MG PO CAPS
0.8000 mg | ORAL_CAPSULE | Freq: Every day | ORAL | Status: DC
  Administered 2020-04-22 – 2020-04-24 (×2): 0.8 mg via ORAL
  Filled 2020-04-22 (×2): qty 2

## 2020-04-22 MED ORDER — LACTATED RINGERS IV BOLUS: 1000.0000 mL | Freq: Three times a day (TID) | INTRAVENOUS | Status: AC | PRN

## 2020-04-22 MED ORDER — PROCHLORPERAZINE EDISYLATE 10 MG/2ML IJ SOLN: 5.0000 mg | INTRAMUSCULAR | Status: DC | PRN

## 2020-04-22 MED ORDER — SODIUM CHLORIDE 0.9% FLUSH
3.0000 mL | Freq: Two times a day (BID) | INTRAVENOUS | Status: DC
  Administered 2020-04-22 – 2020-04-24 (×3): 3 mL via INTRAVENOUS

## 2020-04-22 MED ORDER — MORPHINE SULFATE (PF) 2 MG/ML IV SOLN
2.0000 mg | INTRAVENOUS | Status: DC | PRN
  Filled 2020-04-22: qty 1

## 2020-04-22 MED ORDER — SODIUM CHLORIDE 0.9 % IV SOLN
8.0000 mg | Freq: Four times a day (QID) | INTRAVENOUS | Status: DC | PRN
  Filled 2020-04-22: qty 4

## 2020-04-22 MED ORDER — DIATRIZOATE MEGLUMINE & SODIUM 66-10 % PO SOLN
90.0000 mL | Freq: Once | ORAL | Status: AC
  Administered 2020-04-22: 90 mL via NASOGASTRIC
  Filled 2020-04-22: qty 90

## 2020-04-22 MED ORDER — IOHEXOL 9 MG/ML PO SOLN
ORAL | Status: AC
  Filled 2020-04-22: qty 1000

## 2020-04-22 MED ORDER — ACETAMINOPHEN 325 MG PO TABS
650.0000 mg | ORAL_TABLET | Freq: Four times a day (QID) | ORAL | Status: DC | PRN
  Administered 2020-04-22: 325 mg via ORAL
  Administered 2020-04-22: 650 mg via ORAL
  Filled 2020-04-22 (×2): qty 2

## 2020-04-22 MED ORDER — MAGIC MOUTHWASH
15.0000 mL | Freq: Four times a day (QID) | ORAL | Status: DC | PRN
  Filled 2020-04-22: qty 15

## 2020-04-22 MED ORDER — SIMETHICONE 40 MG/0.6ML PO SUSP
40.0000 mg | Freq: Four times a day (QID) | ORAL | Status: DC | PRN
  Filled 2020-04-22: qty 0.6

## 2020-04-22 MED ORDER — METHOCARBAMOL 1000 MG/10ML IJ SOLN
1000.0000 mg | Freq: Four times a day (QID) | INTRAVENOUS | Status: DC | PRN
  Filled 2020-04-22: qty 10

## 2020-04-22 NOTE — Consult Note (Addendum)
[facility]                                                        Consult/Note Shawn Chambers [birth year]  [id number].    Requesting MD: Loann Quill Chief Complaint: abdominal pain with decreased ostomy output Reason for Consult: SBO vs ileus Oncology: Dr. Betsy Coder   HPI: Patient is a 79 year old male with history of stage II T3N0 descending colon cancer, presenting in 03/2017 with obstructive colon mass and left kidney mass.  He underwent partial colectomy/colostomy with mobilization of splenic flexure and left total nephrectomy 03/28/2017.  By Dr. Georganna Skeans and Dr. Dutch Gray.  Colonoscopy 02/22/2019 shows malignant obstructing tumor in the proximal rectum.  He underwent robotic assisted lower anterior resection, parastomal hernia repair with mesh, ostomy revision, cystoscopy on 123456, by Dr. Leighton Ruff.  MRI 02/02/2020, shows a T3 BN 1 rectal tumor at 4.5 cm from the internal anal sphincter that was 11.5 cm in length.  He has been undergoing radiation and chemotherapy by Dr. Betsy Coder.  His last chemotherapy appears to be on 03/01/2020.  CT scan 03/26/2020 shows wall thickening in the remaining rectum and soft tissue mass of the superior rectum, large retroperitoneal lymph nodes, bladder wall thickening with no evidence of metastatic chest disease.  His family called yesterday reporting he developed yellow stool from his ostomy and issues with constipation/abdominal discomfort around his ostomy. He reports he has episodes of constipation and he takes Dulcolax, and Miralax for this when needed.  He says he also has episodes of loose stools, sometimes almost water.  He reports 3 days of abdominal discomfort, he has stopped eating, and decreased his liquid intake because of distension.   He was apparently seen in the oncology clinic this morning and admitted.  Dr. Doristine Bosworth was told he had no output for 2 days from his ostomy.  But the patient told him he had in fact emptied his  colostomy morning before coming to visit Dr. Benay Spice.  Pt reported it was very liquid.  Note from Dr. Benay Spice on 04/01/2020 notes patient has distant metastasis, nonsurgical candidate, chemotherapy was noncurative/palliative intent was discussed with the patient.  The patient has declined further chemotherapy was to have Hospice on board.  There appears to be some discussion about that.  Abdominal films obtained this morning by Dr. Benay Spice shows dilated air-filled filled loops throughout the abdomen which appear to represent both large and small bowel ileus versus obstruction.  Labs show sodium of 133, potassium of 4.1, glucose of 115, BUN of 30 creatinine of 1.49.  WBC 12.8, hemoglobin 9.6, hematocrit 29.8, platelets 342,000.  INR 1.2, PTT 37, we are asked to see.  ROS: Review of Systems  Constitutional: Negative.   HENT: Positive for hearing loss (left ear worse than right).   Eyes: Negative.   Respiratory: Negative.   Cardiovascular: Negative.   Gastrointestinal: Positive for abdominal pain (abdominal distension, "gas."), constipation (he reports periods of constipation, followed by loose stools almost water) and diarrhea. Negative for nausea and vomiting.  Genitourinary:       Decreased urine output, says he has not been eating or drinking much last 3 days because of abdominal distension.    Musculoskeletal: Negative.   Skin: Negative.   Neurological: Negative.   Endo/Heme/Allergies: Negative.   Psychiatric/Behavioral: Negative.  Family History  Problem Relation Age of Onset  . Esophageal cancer Paternal Uncle   . Colon polyps Daughter   . Colon cancer Neg Hx   . Rectal cancer Neg Hx   . Stomach cancer Neg Hx     Past Medical History:  Diagnosis Date  . Arthritis    hand, knee- ? Dx.  Marland Kitchen BPH (benign prostatic hyperplasia)   . 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 Cataract    removed  . GERD (gastroesophageal reflux disease)   .  Glaucoma   . Hearing loss   . Rectal adenocarcinoma ([place])   . Renal cancer ([place])   . Seizures ([place])    resolved- daughter states she didnt know he ever had seizures    Past Surgical History:  Procedure Laterality Date  . APPENDECTOMY    . COLONOSCOPY    . COLOSTOMY REVISION N/A 10/18/2019   Procedure: OSTOMY REVISION;  Surgeon: Leighton Ruff, MD;  Location: [facility];  Service: General;  Laterality: N/A;  . CYSTOSCOPY WITH STENT PLACEMENT Bilateral 10/18/2019   Procedure: CYSTOSCOPY WITH FIREFLY INJECTION;  Surgeon: Cleon Gustin, MD;  Location: [facility];  Service: Urology;  Laterality: Bilateral;  . PARTIAL COLECTOMY N/A 03/28/2017   Procedure: PARTIAL COLECTOMY/COLOSTOMY;  Surgeon: Georganna Skeans, MD;  Location: [facility];  Service: General;  Laterality: N/A;  Partial Colectomy, Mobilization of Splenic Flexure, Colostomy  . PARTIAL NEPHRECTOMY Left 03/28/2017   Procedure: NEPHRECTOMY TOTAL;  Surgeon: Raynelle Bring, MD;  Location: [facility];  Service: Urology;  Laterality: Left;  . XI ROBOTIC ASSISTED LOWER ANTERIOR RESECTION N/A 10/18/2019   Procedure: XI ROBOTIC ASSISTED LOWER ANTERIOR RESECTION WITH PERISTOMAL HERNIA REPAIR WITH MESH ( SUGAR BAKER);  Surgeon: Leighton Ruff, MD;  Location: [facility];  Service: General;  Laterality: N/A;    Social History:  reports that he quit smoking about 3 years ago. His smoking use included cigarettes. He has a 60.00 pack-year smoking history. He has never used smokeless tobacco. He reports that he does not drink alcohol or use drugs.  Allergies: No Known Allergies  Medications Prior to Admission  Medication Sig Dispense Refill  . bisacodyl (DULCOLAX) 5 MG EC tablet Take 5 mg by mouth daily as needed for moderate constipation.    . docusate sodium (COLACE) 100 MG capsule Take 100 mg by mouth 2 (two) times daily as needed for mild constipation.     Marland Kitchen latanoprost (XALATAN) 0.005 % ophthalmic solution Place 1 drop into both eyes at bedtime.     . magnesium  citrate SOLN Take 0.5 Bottles by mouth as needed for severe constipation.    . Polyethylene Glycol 3350 (MIRALAX PO) Take 17 g by mouth daily as needed (constipation).     . tamsulosin (FLOMAX) 0.4 MG CAPS capsule Take 0.8 mg by mouth at bedtime.     . capecitabine (XELODA) 500 MG tablet Take 3 tablets (1,500 mg total) by mouth 2 (two) times daily after a meal. Start on day of Oxaliplain infusion and take for 2 weeks on and 1 week off (Patient not taking: Reported on 04/22/2020) 84 tablet 0  . prochlorperazine (COMPAZINE) 10 MG tablet Take 1 tablet (10 mg total) by mouth every 6 (six) hours as needed for nausea. (Patient not taking: Reported on 04/22/2020) 30 tablet 1  . traMADol (ULTRAM) 50 MG tablet Take 1 tablet (50 mg total) by mouth every 6 (six) hours as needed (mild pain). (Patient not taking: Reported on 04/01/2020) 30 tablet  0    There were no vitals taken for this visit. Physical Exam: General: pleasant, WD, WN white male who is laying in bed in NAD abdomen is quite distended HEENT: head is normocephalic, atraumatic.  Sclera are noninjected.  Pupils are equal.  He is extremely hard of hearing he reports the left side is worse than the right. Ears and nose without any masses or lesions.  Mouth is pink and moist Heart: regular, rate, and rhythm.  Normal s1,s2. No obvious murmurs, gallops, or rubs noted.  Palpable radial and pedal pulses bilaterally Lungs: CTAB, no wheezes, rhonchi, or rales noted.  Respiratory effort nonlabored Abd: Abdomen is distended there is a well-healed midline surgical scar.  There is a left lower quadrant colostomy with yellow liquid a stool within the colostomy bag.  There are no bowel sounds.  Rectal exam was not performed but patient states he has very little mucus currently coming from his rectum. MS: all 4 extremities are symmetrical with no cyanosis, clubbing, or edema. Skin: warm and dry with no masses, lesions, or rashes Neuro: Cranial nerves 2-12 grossly  intact, sensation is normal throughout Psych: A&Ox3 with an appropriate affect.   Results for orders placed or performed during the hospital encounter of 04/22/20 (from the past 48 hour(s))  CBC     Status: Abnormal   Collection Time: 04/22/20 12:32 PM  Result Value Ref Range   WBC 12.8 (H) 4.0 - 10.5 K/uL   RBC 3.30 (L) 4.22 - 5.81 MIL/uL   Hemoglobin 9.6 (L) 13.0 - 17.0 g/dL   HCT 29.8 (L) 39.0 - 52.0 %   MCV 90.3 80.0 - 100.0 fL   MCH 29.1 26.0 - 34.0 pg   MCHC 32.2 30.0 - 36.0 g/dL   RDW 17.4 (H) 11.5 - 15.5 %   Platelets 342 150 - 400 K/uL   nRBC 0.0 0.0 - 0.2 %    Comment: Performed at [facility], [place] [address], [place], [place] [id number]  Basic metabolic panel     Status: Abnormal   Collection Time: 04/22/20 12:32 PM  Result Value Ref Range   Sodium 133 (L) 135 - 145 mmol/L   Potassium 4.1 3.5 - 5.1 mmol/L   Chloride 97 (L) 98 - 111 mmol/L   CO2 24 22 - 32 mmol/L   Glucose, Bld 115 (H) 70 - 99 mg/dL    Comment: Glucose reference range applies only to samples taken after fasting for at least 8 hours.   BUN 30 (H) 8 - 23 mg/dL   Creatinine, Ser 1.49 (H) 0.61 - 1.24 mg/dL   Calcium 8.8 (L) 8.9 - 10.3 mg/dL   GFR calc non Af Amer 44 (L) >60 mL/min   GFR calc Af Amer 51 (L) >60 mL/min   Anion gap 12 5 - 15    Comment: Performed at [facility], [place] [address], [place], [place] [address]  Magnesium     Status: None   Collection Time: 04/22/20 12:32 PM  Result Value Ref Range   Magnesium 2.2 1.7 - 2.4 mg/dL    Comment: Performed at [facility], [place] [address] [place], [place] [address]  APTT     Status: Abnormal   Collection Time: 04/22/20 12:32 PM  Result Value Ref Range   aPTT 37 (H) 24 - 36 seconds    Comment:        IF BASELINE aPTT IS ELEVATED, SUGGEST PATIENT RISK ASSESSMENT BE USED TO DETERMINE APPROPRIATE  ANTICOAGULANT THERAPY. Performed at [facility], [place] [address]  [place], [place] [address]   Protime-INR     Status: None   Collection Time: 04/22/20 12:32 PM  Result Value Ref Range   Prothrombin Time 15.1 11.4 - 15.2 seconds   INR 1.2 0.8 - 1.2    Comment: (NOTE) INR goal varies based on device and disease states. Performed at [facility], [place] [address], [place], [place] [address]    DG Abd 2 Views  Result Date: 04/22/2020 CLINICAL DATA:  Colorectal cancer, increased abdominal distension and pain EXAM: ABDOMEN - 2 VIEW COMPARISON:  11/28/2018 Correlation: CT abdomen and pelvis 03/26/2020 FINDINGS: Air-filled dilated loops of bowel are seen throughout the abdomen into the upper pelvis. These appear to represent a combination of large and small bowel loops. Absent colonic gas. Air-fluid levels on upright view. LEFT mid abdomen ostomy not well visualized. Mild bowel wall thickening of a single loop of bowel in mid abdomen, question transverse colon. No free air. Gaseous distention of sigmoid colon. Bones demineralized with scattered degenerative disc and facet disease changes. Prostatic calcifications seen on RIGHT. IMPRESSION: Dilated air-filled loops of bowel throughout abdomen which appear to represent both large and small bowel, question ileus versus obstruction. Unable to exclude distal colonic obstruction at or near the colostomy in the LEFT mid abdomen Mild bowel wall thickening of transverse colon. Consider CT imaging with IV and oral contrast for further assessment. Electronically Signed   By: Lavonia Dana M.D.   On: 04/22/2020 10:58   . sodium chloride 100 mL/hr at 04/22/20 1327     Assessment/Plan Abdominal distention with bowel obstruction vs ileus Colon and rectal cancer with recurrent soft tissue mass at the level of the superior rectum S/p partial colectomy/colostomy, left total nephrectomy 03/28/2017, Dr. Georganna Skeans S/p robotic assisted lower anterior resection, parastomal hernia repair with mesh, ostomy revision,  cystoscopy 123456 Dr. Leighton Ruff S/p chemotherapy/radiation therapy -Dr. Betsy Coder CKD with history of left nephrectomy; creatinine 1.49 BPH Hx tobacco use Full code  FEN: IV fluids/n.p.o. except for meds and sips ID: None DVT: SCDs added Follow-up: TBD   Plan: CT scan of the abdomen pelvis with oral contrast, but no IV contrast.  We will follow with you.  Earnstine Regal [facility] 04/22/2020, 1:01 PM Please see Amion for pager number during day hours 7:00am-4:30pm

## 2020-04-22 NOTE — Progress Notes (Signed)
Patient is refusing NG tube placement at this time. Education provided to patient. Dr. Barry Dienes made aware of patient refusing. MD wants Gastrografin given orally still. Confirmed with pharmacy and patient given gastrografin by PO.

## 2020-04-22 NOTE — Progress Notes (Signed)
[place] OFFICE PROGRESS NOTE   Diagnosis: Rectal cancer  INTERVAL HISTORY:   Shawn Chambers missed a scheduled visit after we saw him last month.  He did not begin capecitabine and did not have a Port-A-Cath placed.  He says he has decided against chemotherapy.  He reports abdominal distention for the past week.  He had 2 days with no output from the colostomy.  He has developed soft light brown output over the past few days.  He has about pain.  No nausea/vomiting.  No bleeding.  No fever.  Objective:  Vital signs in last 24 hours:  Blood pressure (!) 121/57, pulse 77, temperature 98.5 F (36.9 C), temperature source Temporal, resp. rate 17, height '5\' 10"'  (1.778 m), weight 176 lb 14.4 oz (80.2 kg), SpO2 100 %.    HEENT: The mucous membranes are moist, no thrush, sclera anicteric Resp: Lungs clear bilaterally Cardio: Regular rate and rhythm GI: Distended, hyperactive bowel sounds, no mass, left lower quadrant colostomy with tan loose stool Vascular: No leg edema     Lab Results:  Lab Results  Component Value Date   WBC 5.4 03/20/2020   HGB 9.5 (L) 03/20/2020   HCT 29.9 (L) 03/20/2020   MCV 92.6 03/20/2020   PLT 199 03/20/2020   NEUTROABS 3.7 03/20/2020    CMP  Lab Results  Component Value Date   NA 138 03/20/2020   K 4.4 03/20/2020   CL 107 03/20/2020   CO2 26 03/20/2020   GLUCOSE 105 (H) 03/20/2020   BUN 20 03/20/2020   CREATININE 1.28 (H) 03/20/2020   CALCIUM 9.0 03/20/2020   PROT 6.4 (L) 03/20/2020   ALBUMIN 3.3 (L) 03/20/2020   AST 11 (L) 03/20/2020   ALT 7 03/20/2020   ALKPHOS 74 03/20/2020   BILITOT 0.3 03/20/2020   GFRNONAA 53 (L) 03/20/2020   GFRAA >60 03/20/2020    Lab Results  Component Value Date   CEA1 52.52 (H) 03/20/2020     Imaging: Abdominal x-ray-dilated air-filled loops of bowel throughout the abdomen with air-fluid levels on the upright view   Medications: I have reviewed the patient's current  medications.   Assessment/Plan: 1. Colon cancer, descending colon, stage II (T3 N0), status post a partial left colectomy 03/28/2017 ? MSI-stable, no loss of mismatch repair protein expression ? CT abdomen/pelvis 03/26/2017 and CT chest 03/27/2017-no evidence metastatic disease, abdomen/pelvis CT-long segment of bowel wall thickening in the left colon, partial mechanical obstruction by the area of thickening in the left colon, solid enhancing left renal lesion ? Elevated preoperative CEA ? CT 11/15/2018-irregular rectal wall thickening/perirectal stranding, polypoid defect in the mid transverse colon proximal to the colostomy-indeterminate, nodularity of the prostate with a hyperdense component in the median lobe-stable ? MRI pelvis 02/01/2019-T3BN1 rectal tumor at 4.5 cm from the internal anal sphincter, 11.5 cm in length ? Colonoscopy 02/22/2019-malignant completely obstructing tumor in the proximal rectum. Polyps in the mid and distal rectum; polyps in the ascending, transverse/distal transverse colon. 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 Rectum biopsy shows high-grade dysplasia with focal features suspicious for adenocarcinoma. ? Radiation/Xeloda 03/06/2019-04/12/2019 ? MRI pelvis 08/17/2019-4.8 cm apple core lesion at the rectosigmoid junction, T3B N0; distance from tumor to the internal anal sphincter is 11.5 cm.   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 BPH  4. History of tobacco use  5.History of an irregular mole at the right upper back  6. Clinical stage III rectal cancer ? Colonoscopy 02/22/2019-malignant completely obstructing tumor in  the proximal rectum. Polyps in the mid and distal rectum; polyps in the ascending, transverse/distal transverse colon. 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 Rectum biopsy shows high-grade dysplasia with focal features suspicious for adenocarcinoma. ? Radiation/Xeloda  03/06/2019-04/12/2019 ? Flexible sigmoidoscopy 06/11/2019-infiltrative and ulcerated completely obstructing large mass found in the rectosigmoid colon/very proximal rectum. Mass circumferential. No bleeding present. Lesion now less fungating but still large in size. 25 mm polyp found in the proximal rectum. 12 mm polyp found in the distal rectum. ? Radiation/Xeloda 03/06/2019-04/12/2019 ? MRI pelvis 08/17/2019-4.8 cm apple core lesion at the rectosigmoid junction, T3B N0; distance from tumor to the internal anal sphincter is 11.5 cm. ? Low anterior resection and repair of parastomal hernia 10/18/2019,pT4pN1a,tumor involves the visceral peritoneum, adenocarcinoma present at the serosa of the distal resection margin, lymphovascular invasion present, 1/22 lymph nodes positive, multiple tumor deposits;MSI stable ? Cycle 1 adjuvant Xeloda 12/24/2019, took wrong dose and missed several days, discontinued 01/15/2020 ? Cycle 2 adjuvant Xeloda 01/21/2020 ? Cycle 3 adjuvant Xeloda2/27/2021 ? Cycle 4 adjuvant Xeloda 03/01/2020 ? CTs 03/26/2020-wall thickening in the remaining rectum with a soft tissue mass at the superior rectum, enlarged retroperitoneal lymph nodes, bladder wall thickening, no evidence of metastatic disease in the chest  7. 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 Tumor appears to involve superficial submucosa. Tumor present at cauterized resection margin (polyp base). No evidence of lymphovascular invasion. Other polyp showing tubulovillous adenoma with focal high-grade dysplasia. 8.  Admission 04/22/2020 with symptoms and an abdominal x-ray consistent with a bowel obstruction      Disposition: Shawn Chambers has metastatic rectal cancer.  There was evidence of locally progressive disease on a CT last month.  He presents today with clinical and radiologic evidence of a partial bowel  obstruction.  Shawn Chambers agrees to hospital admission for further evaluation.  He states that he does not wish to consider further chemotherapy.  He decided against Port-A-Cath placement.  Mr. Rosenwald agrees to a [facility] hospice referral for home hospice care.  I discussed the case with his daughter, Judith Blonder, by telephone.  I explained the reason for hospital admission.  She understands the bowel obstruction could be related to adhesions or unrecognized carcinomatosis.  He will likely need to undergo a CT scan.  We will asked the surgical service to evaluate him to consider surgical options if he does not improve with bowel rest.  She understands the indication for hospice care regardless of the outcome from the bowel obstruction.  I will plan to serve as the primary provider with the home hospice team.  I discussed the case with the hospitalist service.  He will be admitted under their care.  I will check on Mr. Dillenbeck 04/23/2020.  Betsy Coder, MD  04/22/2020  9:40 AM

## 2020-04-22 NOTE — Progress Notes (Signed)
Official read on CT scan not back yet but he has massively dilated bowel with a transition point down in the pelvis and decompressed last foot of terminal ileum consistent with bowel obstruction.  Seems to be right at that lower pelvic mass involving the bladder rectal stump region that was suspicious for local recurrence.  There is no evidence of any frank perforation or abscess.  Nothing that indicates need for emergency surgery tonight.  I would recommend NG tube and small bowel protocol and bowel rest.  IV fluids.  Try and hold off on any operative intervention but if he does not open it may come to that.  The challenge will be his compliance with these recommendations.  We will try and help follow  We will await official read of CT scan  Adin Hector, MD, FACS, MASCRS Gastrointestinal and Minimally Invasive Surgery  [facility] [address], [place], [place] [address] [phone] Fax [phone] Main/Paging  CONTACT INFORMATION: Weekday (9AM-5PM) concerns: Call [facility] main office at [phone] Weeknight (5PM-9AM) or Weekend/Holiday concerns: Check [email or web address] for General Surgery [facility] coverage (Please, do not use SecureChat as it is not reliable communication to surgeons for patient care)

## 2020-04-22 NOTE — H&P (Signed)
History and Physical    Shawn Chambers [id number] DOB: Oct 30, 1941 DOA: 04/22/2020  PCP: [name]  Patient coming from: Home/hematology office  I have personally briefly reviewed patient's old medical records in [place]  Chief Complaint: Abdominal pain  HPI: Shawn Chambers is a 79 y.o. male with medical history significant of BPH, rectal cancer status post colostomy, GERD, significant hearing deficit and seizure who initially went to see his oncologist Dr. Annitta Jersey for regular follow-up visit where he mentioned having abdominal pain and having no output from his colostomy bag and he ended up having abdominal x-ray which showed possible small or large bowel obstruction.  Hospital service was then called by Dr. Annitta Jersey to requested admission and consultation with general surgery.  Patient seen and examined in the room.  He is very hard of hearing.  Nurse at the bedside.  Patient did endorse having abdominal pain intermittently for last 1 week to the nurse but he denied any pain to me.  According to my discussion with Dr. Annitta Jersey, patient did not have any output from his ostomy for last 2 days however patient tells me that he did have output and in fact he emptied the bag this morning before going to the oncology visit.  He denies any nausea, vomiting or any other complaint however he endorses having decreased appetite.  Per my discussion with his oncologist, patient has declined further chemotherapy and in fact was open to have hospice on board but when I tried to talk to him, he was not sure about that.  He was also having hard time understanding the concept of CODE STATUS and could not really respond/choose between DNR and full code.  I then talked to patient's son Shawn Chambers who deferred me to patient's daughter Shawn Chambers who is the oldest child.  I spoke to [facility] and she expressed her wishes to keep the patient as full code for now.  When discussed about hospice, she was not  sure about that however she was agreeable to having palliative care consult.  ED Course: Direct admit from office  Review of Systems: As per HPI otherwise negative.    Past Medical History:  Diagnosis Date  . Arthritis    hand, knee- ? Dx.  Marland Kitchen BPH (benign prostatic hyperplasia)   . 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 Cataract    removed  . GERD (gastroesophageal reflux disease)   . Glaucoma   . Hearing loss   . Rectal adenocarcinoma ([place])   . Renal cancer ([place])   . Seizures ([place])    resolved- daughter states she didnt know he ever had seizures    Past Surgical History:  Procedure Laterality Date  . APPENDECTOMY    . COLONOSCOPY    . COLOSTOMY REVISION N/A 10/18/2019   Procedure: OSTOMY REVISION;  Surgeon: Leighton Ruff, MD;  Location: [facility];  Service: General;  Laterality: N/A;  . CYSTOSCOPY WITH STENT PLACEMENT Bilateral 10/18/2019   Procedure: CYSTOSCOPY WITH FIREFLY INJECTION;  Surgeon: Cleon Gustin, MD;  Location: [facility];  Service: Urology;  Laterality: Bilateral;  . PARTIAL COLECTOMY N/A 03/28/2017   Procedure: PARTIAL COLECTOMY/COLOSTOMY;  Surgeon: Georganna Skeans, MD;  Location: [facility];  Service: General;  Laterality: N/A;  Partial Colectomy, Mobilization of Splenic Flexure, Colostomy  . PARTIAL NEPHRECTOMY Left 03/28/2017   Procedure: NEPHRECTOMY TOTAL;  Surgeon: Raynelle Bring, MD;  Location: [facility];  Service: Urology;  Laterality: Left;  . XI ROBOTIC ASSISTED LOWER ANTERIOR RESECTION  N/A 10/18/2019   Procedure: XI ROBOTIC ASSISTED LOWER ANTERIOR RESECTION WITH PERISTOMAL HERNIA REPAIR WITH MESH ( SUGAR BAKER);  Surgeon: Leighton Ruff, MD;  Location: [facility];  Service: General;  Laterality: N/A;     reports that he quit smoking about 3 years ago. His smoking use included cigarettes. He has a 60.00 pack-year smoking history. He has never used smokeless tobacco. He reports that he does not drink alcohol or use drugs.  No Known  Allergies  Family History  Problem Relation Age of Onset  . Esophageal cancer Paternal Uncle   . Colon polyps Daughter   . Colon cancer Neg Hx   . Rectal cancer Neg Hx   . Stomach cancer Neg Hx     Prior to Admission medications   Medication Sig Start Date End Date Taking? Authorizing Provider  bisacodyl (DULCOLAX) 5 MG EC tablet Take 5 mg by mouth daily as needed for moderate constipation.    [provider]  capecitabine (XELODA) 500 MG tablet Take 3 tablets (1,500 mg total) by mouth 2 (two) times daily after a meal. Start on day of Oxaliplain infusion and take for 2 weeks on and 1 week off Patient not taking: Reported on 04/22/2020 04/01/20   Ladell Pier, MD  docusate sodium (COLACE) 100 MG capsule Take 100 mg by mouth 2 (two) times daily.    [provider]  latanoprost (XALATAN) 0.005 % ophthalmic solution Place 1 drop into both eyes at bedtime.  12/27/18   [provider]  Polyethylene Glycol 3350 (MIRALAX PO) Take 17 g by mouth daily. 01/15/20   [provider]  prochlorperazine (COMPAZINE) 10 MG tablet Take 1 tablet (10 mg total) by mouth every 6 (six) hours as needed for nausea. Patient not taking: Reported on 04/22/2020 04/07/20   Ladell Pier, MD  tamsulosin (FLOMAX) 0.4 MG CAPS capsule Take 0.8 mg by mouth at bedtime.  02/03/19   [provider]  traMADol (ULTRAM) 50 MG tablet Take 1 tablet (50 mg total) by mouth every 6 (six) hours as needed (mild pain). Patient not taking: Reported on 04/01/2020 [id number]   Leighton Ruff, MD    Physical Exam: There were no vitals filed for this visit.  Constitutional: NAD, calm, comfortable There were no vitals filed for this visit. Eyes: PERRL, lids and conjunctivae normal ENMT: Mucous membranes are moist. Posterior pharynx clear of any exudate or lesions.Normal dentition.  Neck: normal, supple, no masses, no thyromegaly Respiratory: clear to auscultation bilaterally, no wheezing, no  crackles. Normal respiratory effort. No accessory muscle use.  Cardiovascular: Regular rate and rhythm, no murmurs / rubs / gallops. No extremity edema. 2+ pedal pulses. No carotid bruits.  Abdomen: Mild distention, no tenderness, no masses palpated. No hepatosplenomegaly. Bowel sounds positive.  Musculoskeletal: no clubbing / cyanosis. No joint deformity upper and lower extremities. Good ROM, no contractures. Normal muscle tone.  Skin: no rashes, lesions, ulcers. No induration Neurologic: CN 2-12 grossly intact. Sensation intact, DTR normal. Strength 5/5 in all 4.  Psychiatric: Poor judgment and insight. Alert and oriented x 3. Normal mood.    Labs on Admission: I have personally reviewed following labs and imaging studies  CBC: No results for input(s): WBC, NEUTROABS, HGB, HCT, MCV, PLT in the last 168 hours. Basic Metabolic Panel: No results for input(s): NA, K, CL, CO2, GLUCOSE, BUN, CREATININE, CALCIUM, MG, PHOS in the last 168 hours. GFR: CrCl cannot be calculated (Patient's most recent lab result is older than the maximum 21  days allowed.). Liver Function Tests: No results for input(s): AST, ALT, ALKPHOS, BILITOT, PROT, ALBUMIN in the last 168 hours. No results for input(s): LIPASE, AMYLASE in the last 168 hours. No results for input(s): AMMONIA in the last 168 hours. Coagulation Profile: No results for input(s): INR, PROTIME in the last 168 hours. Cardiac Enzymes: No results for input(s): CKTOTAL, CKMB, CKMBINDEX, TROPONINI in the last 168 hours. BNP (last 3 results) No results for input(s): PROBNP in the last 8760 hours. HbA1C: No results for input(s): HGBA1C in the last 72 hours. CBG: No results for input(s): GLUCAP in the last 168 hours. Lipid Profile: No results for input(s): CHOL, HDL, LDLCALC, TRIG, CHOLHDL, LDLDIRECT in the last 72 hours. Thyroid Function Tests: No results for input(s): TSH, T4TOTAL, FREET4, T3FREE, THYROIDAB in the last 72 hours. Anemia Panel: No  results for input(s): VITAMINB12, FOLATE, FERRITIN, TIBC, IRON, RETICCTPCT in the last 72 hours. Urine analysis:    Component Value Date/Time   COLORURINE STRAW (A) 11/22/2019 0919   APPEARANCEUR CLEAR 11/22/2019 0919   LABSPEC 1.008 11/22/2019 0919   PHURINE 6.0 11/22/2019 0919   GLUCOSEU NEGATIVE 11/22/2019 0919   HGBUR NEGATIVE 11/22/2019 0919   BILIRUBINUR NEGATIVE 11/22/2019 0919   KETONESUR NEGATIVE 11/22/2019 0919   PROTEINUR NEGATIVE 11/22/2019 0919   NITRITE NEGATIVE 11/22/2019 0919   LEUKOCYTESUR NEGATIVE 11/22/2019 0919    Radiological Exams on Admission: DG Abd 2 Views  Result Date: 04/22/2020 CLINICAL DATA:  Colorectal cancer, increased abdominal distension and pain EXAM: ABDOMEN - 2 VIEW COMPARISON:  11/28/2018 Correlation: CT abdomen and pelvis 03/26/2020 FINDINGS: Air-filled dilated loops of bowel are seen throughout the abdomen into the upper pelvis. These appear to represent a combination of large and small bowel loops. Absent colonic gas. Air-fluid levels on upright view. LEFT mid abdomen ostomy not well visualized. Mild bowel wall thickening of a single loop of bowel in mid abdomen, question transverse colon. No free air. Gaseous distention of sigmoid colon. Bones demineralized with scattered degenerative disc and facet disease changes. Prostatic calcifications seen on RIGHT. IMPRESSION: Dilated air-filled loops of bowel throughout abdomen which appear to represent both large and small bowel, question ileus versus obstruction. Unable to exclude distal colonic obstruction at or near the colostomy in the LEFT mid abdomen Mild bowel wall thickening of transverse colon. Consider CT imaging with IV and oral contrast for further assessment. Electronically Signed   By: Lavonia Dana M.D.   On: 04/22/2020 10:58    Assessment/Plan Active Problems:   SBO (small bowel obstruction) (HCC)   S/P partial colectomy   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   Rectal  cancer s/p robotic LAR rectosigmoid resction 10/19/2019   BPH (benign prostatic hyperplasia)   HOH (hard of hearing)   Small bowel obstruction (HCC)    Small bowel vs large bowel obstruction vs ileus: Likely secondary to metastatic rectal cancer.  He does not seem to have any symptoms and looks very comfortable and hemodynamically stable at this point in time.  Does not seem to need any surgery at this point in time.  We will start with conservative management, keep n.p.o. with IV fluids.  Will obtain general surgery consultation per Dr. Cathleen Fears request.  Will defer to general surgery for further investigation such as any CT abdomen.  BPH: Resume Flomax.  CKD stage IIIa: We will check BMP.  Baseline GFR in the upper 50s.  Clinical stage III rectal cancer with diverting colostomy: Has output in the colostomy bag.  Further  management per oncology and general surgery.  DVT prophylaxis: Lovenox Code Status: Full code, see my detailed discussion above Family Communication: None present at bedside.  Plan of care discussed with patient in length and his daughter over the phone. Disposition Plan: Likely home in next 2 to 3 days Consults called: General surgery and oncology Admission status: Inpatient   Status is: Inpatient  Remains inpatient appropriate because:Inpatient level of care appropriate due to severity of illness   Dispo: The patient is from: Home              Anticipated d/c is to: Home              Anticipated d/c date is: 2 days              Patient currently is not medically stable to d/c.        Darliss Cheney MD [facility]  04/22/2020, 12:29 PM  To contact the attending provider between 7A-7P or the covering provider during after hours 7P-7A, please log into the web site [email or web address]

## 2020-04-22 NOTE — Progress Notes (Signed)
12:00: Transported patient to room #1601 in w/c in stable condition. SBAR report given to [place], Therapist, sports. Spoke w/son, Freda Munro that patient is being admitted for ileus vs bowel obstruction for bowel rest, fluids and surgical consult. Patient had agreed to [facility], but son strongly refuses "I'm not letting those people in my house. They kill people". Patient then decided to hold off on this referral. Nurse was notified.

## 2020-04-23 ENCOUNTER — Inpatient Hospital Stay (HOSPITAL_COMMUNITY): Payer: Medicare Other

## 2020-04-23 DIAGNOSIS — N400 Enlarged prostate without lower urinary tract symptoms: Secondary | ICD-10-CM

## 2020-04-23 DIAGNOSIS — N1330 Unspecified hydronephrosis: Secondary | ICD-10-CM

## 2020-04-23 DIAGNOSIS — N1831 Chronic kidney disease, stage 3a: Secondary | ICD-10-CM

## 2020-04-23 DIAGNOSIS — K56609 Unspecified intestinal obstruction, unspecified as to partial versus complete obstruction: Secondary | ICD-10-CM

## 2020-04-23 DIAGNOSIS — E871 Hypo-osmolality and hyponatremia: Secondary | ICD-10-CM

## 2020-04-23 LAB — CBC
HCT: 29 % — ABNORMAL LOW (ref 39.0–52.0)
Hemoglobin: 9.5 g/dL — ABNORMAL LOW (ref 13.0–17.0)
MCH: 29.2 pg (ref 26.0–34.0)
MCHC: 32.8 g/dL (ref 30.0–36.0)
MCV: 89.2 fL (ref 80.0–100.0)
Platelets: 313 10*3/uL (ref 150–400)
RBC: 3.25 MIL/uL — ABNORMAL LOW (ref 4.22–5.81)
RDW: 17.3 % — ABNORMAL HIGH (ref 11.5–15.5)
WBC: 11.2 10*3/uL — ABNORMAL HIGH (ref 4.0–10.5)
nRBC: 0 % (ref 0.0–0.2)

## 2020-04-23 LAB — BASIC METABOLIC PANEL
Anion gap: 12 (ref 5–15)
BUN: 28 mg/dL — ABNORMAL HIGH (ref 8–23)
CO2: 22 mmol/L (ref 22–32)
Calcium: 8.6 mg/dL — ABNORMAL LOW (ref 8.9–10.3)
Chloride: 99 mmol/L (ref 98–111)
Creatinine, Ser: 1.37 mg/dL — ABNORMAL HIGH (ref 0.61–1.24)
GFR calc Af Amer: 56 mL/min — ABNORMAL LOW (ref >60)
GFR calc non Af Amer: 49 mL/min — ABNORMAL LOW (ref >60)
Glucose, Bld: 102 mg/dL — ABNORMAL HIGH (ref 70–99)
Potassium: 3.6 mmol/L (ref 3.5–5.1)
Sodium: 133 mmol/L — ABNORMAL LOW (ref 135–145)

## 2020-04-23 LAB — SARS CORONAVIRUS 2 BY RT PCR (HOSPITAL ORDER, PERFORMED IN ~~LOC~~ HOSPITAL LAB): SARS Coronavirus 2: NEGATIVE

## 2020-04-23 MED ORDER — ENOXAPARIN SODIUM 40 MG/0.4ML ~~LOC~~ SOLN: 40.0000 mg | SUBCUTANEOUS | Status: DC

## 2020-04-23 MED ORDER — CEFAZOLIN SODIUM-DEXTROSE 2-4 GM/100ML-% IV SOLN
2.0000 g | INTRAVENOUS | Status: AC
  Filled 2020-04-23: qty 100

## 2020-04-23 MED ORDER — LORAZEPAM 2 MG/ML IJ SOLN
0.5000 mg | Freq: Once | INTRAMUSCULAR | Status: AC | PRN
  Administered 2020-04-23: 0.5 mg via INTRAVENOUS

## 2020-04-23 NOTE — Consult Note (Addendum)
Chief Complaint: Patient was seen in consultation today for percutaneous gastrostomy tube placement  Referring Physician(s): Sherrill,B  Supervising Physician: Vernia Buff  Patient Status: [facility] - In-pt  History of Present Illness: Friend Vanderpoel is a 79 y.o. 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  He was admitted to [facility] yesterday with small bowel obstruction secondary to recurrent rectal carcinoma.  Patient was evaluated by surgery and they feel he is not a good candidate for operative intervention.  Request now received for venting gastrostomy tube so that patient can get NG tube out and transition home with hospice care.  Past Medical History:  Diagnosis Date  . Arthritis    hand, knee- ? Dx.  Marland Kitchen BPH (benign prostatic hyperplasia)   . 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 Cataract    removed  . GERD (gastroesophageal reflux disease)   . Glaucoma   . Hearing loss   . Rectal adenocarcinoma ([place])   . Renal cancer ([place])   . Seizures ([place])    resolved- daughter states she didnt know he ever had seizures    Past Surgical History:  Procedure Laterality Date  . APPENDECTOMY    . COLONOSCOPY    . COLOSTOMY REVISION N/A 10/18/2019   Procedure: OSTOMY REVISION;  Surgeon: Leighton Ruff, MD;  Location: [facility];  Service: General;  Laterality: N/A;  . CYSTOSCOPY WITH STENT PLACEMENT Bilateral 10/18/2019   Procedure: CYSTOSCOPY WITH FIREFLY INJECTION;  Surgeon: Cleon Gustin, MD;  Location: [facility];  Service: Urology;  Laterality: Bilateral;  . PARTIAL COLECTOMY N/A 03/28/2017   Procedure: PARTIAL COLECTOMY/COLOSTOMY;  Surgeon: Georganna Skeans, MD;  Location: [facility];   Service: General;  Laterality: N/A;  Partial Colectomy, Mobilization of Splenic Flexure, Colostomy  . PARTIAL NEPHRECTOMY Left 03/28/2017   Procedure: NEPHRECTOMY TOTAL;  Surgeon: Raynelle Bring, MD;  Location: [facility];  Service: Urology;  Laterality: Left;  . XI ROBOTIC ASSISTED LOWER ANTERIOR RESECTION N/A 10/18/2019   Procedure: XI ROBOTIC ASSISTED LOWER ANTERIOR RESECTION WITH PERISTOMAL HERNIA REPAIR WITH MESH ( SUGAR BAKER);  Surgeon: Leighton Ruff, MD;  Location: [facility];  Service: General;  Laterality: N/A;    Allergies: Patient has no known allergies.  Medications: Prior to Admission medications   Medication Sig Start Date End Date Taking? Authorizing Provider  bisacodyl (DULCOLAX) 5 MG EC tablet Take 5 mg by mouth daily as needed for moderate constipation.   Yes [provider]  docusate sodium (COLACE) 100 MG capsule Take 100 mg by mouth 2 (two) times daily as needed for mild constipation.    Yes [provider]  latanoprost (XALATAN) 0.005 % ophthalmic solution Place 1 drop into both eyes at bedtime.  12/27/18  Yes [provider]  magnesium citrate SOLN Take 0.5 Bottles by mouth as needed for severe constipation.   Yes [provider]  Polyethylene Glycol 3350 (MIRALAX PO) Take 17 g by mouth daily as needed (constipation).  01/15/20  Yes [provider]  tamsulosin (FLOMAX) 0.4 MG CAPS capsule Take 0.8 mg by mouth at bedtime.  02/03/19  Yes [provider]  capecitabine (XELODA) 500 MG tablet Take 3 tablets (1,500 mg total) by mouth 2 (two) times daily after a meal. Start on day of Oxaliplain  infusion and take for 2 weeks on and 1 week off Patient not taking: Reported on 04/22/2020 04/01/20   Ladell Pier, MD  prochlorperazine (COMPAZINE) 10 MG tablet Take 1 tablet (10 mg total) by mouth every 6 (six) hours as needed for nausea. Patient not taking: Reported on 04/22/2020 04/07/20   Ladell Pier, MD  traMADol (ULTRAM) 50 MG tablet  Take 1 tablet (50 mg total) by mouth every 6 (six) hours as needed (mild pain). Patient not taking: Reported on 04/01/2020 [id number]   Leighton Ruff, MD     Family History  Problem Relation Age of Onset  . Esophageal cancer Paternal Uncle   . Colon polyps Daughter   . Colon cancer Neg Hx   . Rectal cancer Neg Hx   . Stomach cancer Neg Hx     Social History   Socioeconomic History  . Marital status: Single    Spouse name: Not on file  . Number of children: Not on file  . Years of education: Not on file  . Highest education level: Not on file  Occupational History  . Not on file  Tobacco Use  . Smoking status: Former Smoker    Packs/day: 1.00    Years: 60.00    Pack years: 60.00    Types: Cigarettes    Quit date: 03/14/2017    Years since quitting: 3.1  . Smokeless tobacco: Never Used  Substance and Sexual Activity  . Alcohol use: No  . Drug use: No  . Sexual activity: Not on file  Other Topics Concern  . Not on file  Social History Narrative  . Not on file   Social Determinants of Health   Financial Resource Strain:   . Difficulty of Paying Living Expenses:   Food Insecurity:   . Worried About Charity fundraiser in the Last Year:   . Arboriculturist in the Last Year:   Transportation Needs:   . Film/video editor (Medical):   Marland Kitchen Lack of Transportation (Non-Medical):   Physical Activity:   . Days of Exercise per Week:   . Minutes of Exercise per Session:   Stress:   . Feeling of Stress :   Social Connections:   . Frequency of Communication with Friends and Family:   . Frequency of Social Gatherings with Friends and Family:   . Attends Religious Services:   . Active Member of Clubs or Organizations:   . Attends Archivist Meetings:   Marland Kitchen Marital Status:       Review of Systems currently denies fever, headache, chest pain, worsening dyspnea, cough, back pain, nausea, vomiting or bleeding.  Does have significant abdominal distention; he is hard of  hearing.  Vital Signs: BP 108/62 (BP Location: Left Arm)   Pulse 95   Temp 99.5 F (37.5 C) (Oral)   Resp 18   SpO2 93%   Physical Exam awake, answers questions appropriately,NG-tube in place, chest with distant breath sounds bilaterally.  Heart with normal rate, occasional ectopy.  Abdomen distended, left lower quadrant ostomy intact;few BS; no LE edema  Imaging: CT ABDOMEN PELVIS WO CONTRAST  Result Date: 04/22/2020 CLINICAL DATA:  History of colon carcinoma and left renal mass with new abdominal distension. EXAM: CT ABDOMEN AND PELVIS WITHOUT CONTRAST TECHNIQUE: Multidetector CT imaging of the abdomen and pelvis was performed following the standard protocol without IV contrast. COMPARISON:  03/26/2020 FINDINGS: Lower chest: Scarring is noted in the bases bilaterally. No focal infiltrate or effusion  is seen. Hepatobiliary: No focal liver abnormality is seen. No gallstones, gallbladder wall thickening, or biliary dilatation. Prior granulomatous disease in the liver is noted. Pancreas: Unremarkable. No pancreatic ductal dilatation or surrounding inflammatory changes. Spleen: Normal in size without focal abnormality. Adrenals/Urinary Tract: Adrenal glands are within normal limits. The left kidney has been surgically removed. The right kidney now shows moderate hydronephrosis and hydroureter which extends inferiorly towards the urinary bladder. The bladder is decompressed but demonstrates significant posterior wall thickening. Stomach/Bowel: Hartmann's pouch is noted distally with changes consistent with a left-sided colostomy. Hartmann's pouch again demonstrates wall thickening similar to that seen on the prior exam and also lies within the amorphous presacral soft tissue density seen previously. The colon is decompressed. There is significant dilatation of the small bowel from the proximal jejunum to the mid to distal ileum with a focal caliber change noted deep within the pelvis adjacent to  previously mentioned amorphous soft tissue in the presacral region. This is best visualized on image number 75 of series 2. This represents progression of local disease with involvement of the mid to distal ileum. The distal most ileum is within normal limits just beyond this abnormality. Vascular/Lymphatic: Atherosclerotic calcifications are seen. Mild dilatation of the aorta is again noted and stable up to 4.1 cm. Some intra-aortocaval nodes are again seen and stable. No definitive pelvic sidewall adenopathy is noted. Reproductive: Prostate is enlarged which may contribute to the diffuse bladder wall thickening. Other: No ascites is noted. Fat containing inguinal hernias left greater than right are again noted. Musculoskeletal: Degenerative changes of the lumbar spine are seen. No definitive metastatic lesions are noted. IMPRESSION: Progression of the amorphous density in the presacral region consistent with localized recurrence of the patient's known colon carcinoma. This causes increased hydronephrosis of the right kidney as well as significant small bowel dilatation extending from the proximal jejunum to the mid to distal ileum. The colon beyond the distal ileum is within normal limits. Dilatation of the abdominal aorta. Recommend followup by [place] in 1 year. This recommendation follows ACR consensus guidelines: White Paper of the ACR Incidental Findings Committee II on Vascular Findings. J Am Coll Radiol 2013; 10:789-794. Bladder wall thickening with associated enlarged prostate. This is stable from the prior exam. Status post left nephrectomy. Chronic changes as described above. Electronically Signed   By: Inez Catalina M.D.   On: 04/22/2020 19:13   CT CHEST W CONTRAST  Result Date: 03/26/2020 CLINICAL DATA:  History of rectal and renal carcinoma. Chemotherapy for rectal cancer. Occasional abdominopelvic pain and blood in stool. Restaging of rectal cancer with rising CEA. Status post low anterior resection of  rectal cancer on 10/18/2019. EXAM: CT CHEST, ABDOMEN, AND PELVIS WITH CONTRAST TECHNIQUE: Multidetector CT imaging of the chest, abdomen and pelvis was performed following the standard protocol during bolus administration of intravenous contrast. CONTRAST:  11mL OMNIPAQUE IOHEXOL 300 MG/ML  SOLN COMPARISON:  08/17/2019 rectal MRI.  01/23/2018 CTs. FINDINGS: CT CHEST FINDINGS Cardiovascular: Aortic atherosclerosis. Upper normal ascending aortic size, including at 3.9 cm. Tortuous thoracic aorta. Mild cardiomegaly, without pericardial effusion. Multivessel coronary artery atherosclerosis. No central pulmonary embolism, on this non-dedicated study. Pulmonary artery enlargement, outflow tract 3.2 cm Mediastinum/Nodes: No supraclavicular adenopathy. No mediastinal or hilar adenopathy. Lungs/Pleura: No pleural fluid.  Clear lungs. Musculoskeletal: No acute osseous abnormality. CT ABDOMEN PELVIS FINDINGS Hepatobiliary: Old granulomatous disease in the liver. Normal gallbladder, without biliary ductal dilatation. Pancreas: Normal, without mass or ductal dilatation. Spleen: Normal in size, without focal abnormality.  Adrenals/Urinary Tract: Normal adrenal glands. Left nephrectomy. Right renal too small to characterize lesions. No hydronephrosis. The bladder is moderately thick walled with surrounding edema. Stomach/Bowel: Normal stomach, without wall thickening. Left abdominal distal transverse colostomy. The remaining rectum is moderately thick walled, including on 108/2. At the proximal most portion of the rectum, there is infiltrative soft tissue density including at 3.6 x 5.6 cm on 103/2. This is intimately associated with the seminal vesicles, including on 106/2. Contiguous, more amorphous presacral soft tissue including on 98/2 is new since the prior CT. Small bowel loops are normal in caliber. Small bowel contacts the presacral soft tissue thickening, including on 101/2. Vascular/Lymphatic: Aortic atherosclerosis.  Infrarenal aortic ectasia at 3.9 x 3.5 cm is similar. No extension into the iliacs. Aortocaval node measures 8 mm on 70/2 versus 2 mm on the prior CT. More cephalad aortocaval node of 8 mm on 67/2 has also enlarged from approximately 2 mm on the prior. No pelvic sidewall adenopathy. Reproductive: Mild prostatomegaly. Seminal vesicle contact with suspicious soft tissue density in the pelvis. Median lobe prostate impression into the urinary bladder. Other: Small fat containing left inguinal hernia. No evidence of omental or peritoneal disease. Musculoskeletal: Left acetabular sub subcentimeter sclerotic lesion is likely a bone island and is unchanged. IMPRESSION: 1. Interval low anterior resection of rectal cancer. Wall thickening within the remaining rectum with soft tissue mass at the level of the superior most rectum. Highly suspicious for locally advanced, residual/recurrent disease. Infectious etiology felt less likely. More amorphous presacral soft tissue thickening could be treatment related, but is nonspecific. Consider PET to direct sampling. 2. Developing retroperitoneal adenopathy, highly suspicious for nodal metastasis. 3. Bladder wall thickening and pericystic edema, suspicious for cystitis. Correlate with pelvic radiation therapy. 4.  No acute process or evidence of metastatic disease in the chest. 5. Aortic atherosclerosis (ICD10-I70.0) and emphysema (ICD10-J43.9). 6. Pulmonary artery enlargement suggests pulmonary arterial hypertension. Electronically Signed   By: Abigail Miyamoto M.D.   On: 03/26/2020 12:31   CT ABDOMEN PELVIS W CONTRAST  Result Date: 03/26/2020 CLINICAL DATA:  History of rectal and renal carcinoma. Chemotherapy for rectal cancer. Occasional abdominopelvic pain and blood in stool. Restaging of rectal cancer with rising CEA. Status post low anterior resection of rectal cancer on 10/18/2019. EXAM: CT CHEST, ABDOMEN, AND PELVIS WITH CONTRAST TECHNIQUE: Multidetector CT imaging of the  chest, abdomen and pelvis was performed following the standard protocol during bolus administration of intravenous contrast. CONTRAST:  162mL OMNIPAQUE IOHEXOL 300 MG/ML  SOLN COMPARISON:  08/17/2019 rectal MRI.  01/23/2018 CTs. FINDINGS: CT CHEST FINDINGS Cardiovascular: Aortic atherosclerosis. Upper normal ascending aortic size, including at 3.9 cm. Tortuous thoracic aorta. Mild cardiomegaly, without pericardial effusion. Multivessel coronary artery atherosclerosis. No central pulmonary embolism, on this non-dedicated study. Pulmonary artery enlargement, outflow tract 3.2 cm Mediastinum/Nodes: No supraclavicular adenopathy. No mediastinal or hilar adenopathy. Lungs/Pleura: No pleural fluid.  Clear lungs. Musculoskeletal: No acute osseous abnormality. CT ABDOMEN PELVIS FINDINGS Hepatobiliary: Old granulomatous disease in the liver. Normal gallbladder, without biliary ductal dilatation. Pancreas: Normal, without mass or ductal dilatation. Spleen: Normal in size, without focal abnormality. Adrenals/Urinary Tract: Normal adrenal glands. Left nephrectomy. Right renal too small to characterize lesions. No hydronephrosis. The bladder is moderately thick walled with surrounding edema. Stomach/Bowel: Normal stomach, without wall thickening. Left abdominal distal transverse colostomy. The remaining rectum is moderately thick walled, including on 108/2. At the proximal most portion of the rectum, there is infiltrative soft tissue density including at 3.6 x 5.6 cm on  103/2. This is intimately associated with the seminal vesicles, including on 106/2. Contiguous, more amorphous presacral soft tissue including on 98/2 is new since the prior CT. Small bowel loops are normal in caliber. Small bowel contacts the presacral soft tissue thickening, including on 101/2. Vascular/Lymphatic: Aortic atherosclerosis. Infrarenal aortic ectasia at 3.9 x 3.5 cm is similar. No extension into the iliacs. Aortocaval node measures 8 mm on 70/2  versus 2 mm on the prior CT. More cephalad aortocaval node of 8 mm on 67/2 has also enlarged from approximately 2 mm on the prior. No pelvic sidewall adenopathy. Reproductive: Mild prostatomegaly. Seminal vesicle contact with suspicious soft tissue density in the pelvis. Median lobe prostate impression into the urinary bladder. Other: Small fat containing left inguinal hernia. No evidence of omental or peritoneal disease. Musculoskeletal: Left acetabular sub subcentimeter sclerotic lesion is likely a bone island and is unchanged. IMPRESSION: 1. Interval low anterior resection of rectal cancer. Wall thickening within the remaining rectum with soft tissue mass at the level of the superior most rectum. Highly suspicious for locally advanced, residual/recurrent disease. Infectious etiology felt less likely. More amorphous presacral soft tissue thickening could be treatment related, but is nonspecific. Consider PET to direct sampling. 2. Developing retroperitoneal adenopathy, highly suspicious for nodal metastasis. 3. Bladder wall thickening and pericystic edema, suspicious for cystitis. Correlate with pelvic radiation therapy. 4.  No acute process or evidence of metastatic disease in the chest. 5. Aortic atherosclerosis (ICD10-I70.0) and emphysema (ICD10-J43.9). 6. Pulmonary artery enlargement suggests pulmonary arterial hypertension. Electronically Signed   By: Abigail Miyamoto M.D.   On: 03/26/2020 12:31   DG Abd 2 Views  Result Date: 04/22/2020 CLINICAL DATA:  Colorectal cancer, increased abdominal distension and pain EXAM: ABDOMEN - 2 VIEW COMPARISON:  11/28/2018 Correlation: CT abdomen and pelvis 03/26/2020 FINDINGS: Air-filled dilated loops of bowel are seen throughout the abdomen into the upper pelvis. These appear to represent a combination of large and small bowel loops. Absent colonic gas. Air-fluid levels on upright view. LEFT mid abdomen ostomy not well visualized. Mild bowel wall thickening of a single loop  of bowel in mid abdomen, question transverse colon. No free air. Gaseous distention of sigmoid colon. Bones demineralized with scattered degenerative disc and facet disease changes. Prostatic calcifications seen on RIGHT. IMPRESSION: Dilated air-filled loops of bowel throughout abdomen which appear to represent both large and small bowel, question ileus versus obstruction. Unable to exclude distal colonic obstruction at or near the colostomy in the LEFT mid abdomen Mild bowel wall thickening of transverse colon. Consider CT imaging with IV and oral contrast for further assessment. Electronically Signed   By: Lavonia Dana M.D.   On: 04/22/2020 10:58   DG Abd Portable 1V-Small Bowel Obstruction Protocol-initial, 8 hr delay  Result Date: 04/23/2020 CLINICAL DATA:  Small bowel protocol postcontrast EXAM: PORTABLE ABDOMEN - 1 VIEW COMPARISON:  Apr 23, 2020 1:51 a.m. FINDINGS: NG tube tip is again noted within the proximal stomach. A small amount of residual contrast is seen likely within the stomach. There are mildly prominent air-filled loops of bowel within the mid abdomen measuring up to 5.4 cm. IMPRESSION: Again noted are multiple dilated loops of bowel, slightly more prominent than the prior exam. Electronically Signed   By: Prudencio Pair M.D.   On: 04/23/2020 03:11   DG Abd Portable 1V  Result Date: 04/23/2020 CLINICAL DATA:  NG tube placement EXAM: PORTABLE ABDOMEN - 1 VIEW COMPARISON:  04/22/2020 FINDINGS: Nasogastric tube tip and side port project over  the stomach. There are multiple dilated loops of bowel throughout the abdomen. IMPRESSION: Nasogastric tube tip and side port projecting over the stomach. Electronically Signed   By: Ulyses Jarred M.D.   On: 04/23/2020 02:05    Labs:  CBC: Recent Labs    02/29/20 1014 03/20/20 1124 04/22/20 1232 04/23/20 0516  WBC 5.5 5.4 12.8* 11.2*  HGB 9.4* 9.5* 9.6* 9.5*  HCT 29.6* 29.9* 29.8* 29.0*  PLT 185 199 342 313    COAGS: Recent Labs     04/22/20 1232  INR 1.2  APTT 37*    BMP: Recent Labs    02/29/20 1014 03/20/20 1124 04/22/20 1232 04/23/20 0516  NA 140 138 133* 133*  K 4.8 4.4 4.1 3.6  CL 107 107 97* 99  CO2 27 26 24 22   GLUCOSE 101* 105* 115* 102*  BUN 22 20 30* 28*  CALCIUM 9.1 9.0 8.8* 8.6*  CREATININE 1.25* 1.28* 1.49* 1.37*  GFRNONAA 54* 53* 44* 49*  GFRAA >60 >60 51* 56*    LIVER FUNCTION TESTS: Recent Labs    01/15/20 0920 02/15/20 1142 02/29/20 1014 03/20/20 1124  BILITOT 0.4 0.3 0.3 0.3  AST 11* 14* 12* 11*  ALT 9 9 7 7   ALKPHOS 70 71 69 74  PROT 7.1 7.2 6.7 6.4*  ALBUMIN 3.5 3.6 3.4* 3.3*    TUMOR MARKERS: No results for input(s): AFPTM, CEA, CA199, CHROMGRNA in the last 8760 hours.  Assessment and Plan: 79 y.o. 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  He was admitted to [facility] yesterday with small bowel obstruction secondary to recurrent rectal carcinoma.  Patient was evaluated by surgery and they feel he is not a good candidate for operative intervention.  Request now received for venting gastrostomy tube so that patient can get NG tube out and transition home with hospice care.  Imaging studies have been reviewed by Dr. Anselm Pancoast. Risks and benefits image guided gastrostomy tube placement was discussed with the patient/daughter including, but not limited to the need for a barium enema during the procedure, bleeding, infection, peritonitis and/or damage to adjacent structures.  All of the patient's questions were answered, patient is agreeable to proceed.  Consent signed and in chart.  COVID 19 negative; he is afebrile, WBC 11.2, hemoglobin 9.5, platelets 313 k, creatinine 1.37, PT/INR normal ;patient will be  tentatively scheduled for procedure 5/13    Thank you for this interesting consult.  I greatly enjoyed meeting [facility] and look forward to participating in their care.  A copy of this report was sent to the requesting provider on this date.  Electronically Signed: D. Rowe Robert, PA-C 04/23/2020, 3:57 PM   I spent a total of 30 minutes   in face to face in clinical consultation, greater than 50% of which was counseling/coordinating care for percutaneous gastrostomy tube placement

## 2020-04-23 NOTE — Progress Notes (Signed)
0.5mg  of ativan given for comfort during NG tube placement around 1am- NG tube placed at 1:15am.. X-ray order has been placed to confirm placement before connecting to suction. Patient tolerated well.

## 2020-04-23 NOTE — Progress Notes (Signed)
Per Dr. Myna Hidalgo on-call - confirmed that NG tube is in correct position for low suction. Low suction started.

## 2020-04-23 NOTE — Progress Notes (Signed)
CC: abdominal pain with decreased ostomy output  Subjective: NG is in place and draining feculent fluid.  He says he feels better less distention, but his stomach still remains fairly distended.  There is a small amount of feculent material in his ostomy bag similar in appearance to what is coming out of his NG tube.  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 This causes increased hydronephrosis of the right kidney as well as significant small bowel dilatation extending from the proximal jejunum to the mid to distal ileum. The colon beyond the distal ileum is within normal limits. Small bowel protocol 8 hr film:A small amount of residual contrast is seen likely within the stomach. There are mildly prominent air-filled loops of bowel within the mid abdomen measuring up to 5.4 cm.  Intake/Output from previous day: 05/11 0701 - 05/12 0700 In: -  Out: 1050 [Emesis/NG output:800; Stool:250] Intake/Output this shift: No intake/output data recorded.  General appearance: alert, cooperative and no distress Resp: clear to auscultation bilaterally GI: Abdomen still distended, bowel sounds are hypoactive.  There is still some output from his ostomy.Marland Kitchen  He feels less distended than yesterday.  Lab Results:  Recent Labs    04/22/20 1232 04/23/20 0516  WBC 12.8* 11.2*  HGB 9.6* 9.5*  HCT 29.8* 29.0*  PLT 342 313    BMET Recent Labs    04/22/20 1232  04/23/20 0516  NA 133* 133*  K 4.1 3.6  CL 97* 99  CO2 24 22  GLUCOSE 115* 102*  BUN 30* 28*  CREATININE 1.49* 1.37*  CALCIUM 8.8* 8.6*   PT/INR Recent Labs    04/22/20 1232  LABPROT 15.1  INR 1.2    No results for input(s): AST, ALT, ALKPHOS, BILITOT, PROT, ALBUMIN in the last 168 hours.   Lipase     Component Value Date/Time   LIPASE 23 03/26/2017 1127     Medications: . enoxaparin (LOVENOX) injection  40 mg Subcutaneous Q24H  . latanoprost  1 drop Both Eyes QHS  . lip balm  1 application Topical BID  . sodium chloride flush  3 mL Intravenous Q12H  . 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 Georganna Skeans S/p robotic assisted lower anterior resection, parastomal hernia repair with mesh, ostomy revision, cystoscopy [id number] Dr. Leighton Ruff S/p chemotherapy/radiation therapy -Dr. Betsy Coder  - CT 5/11 - progression of presacral density, consistent with localized reoccurrence of know colon cancer; increased hydronephrosis on the right and significant SB dilatation from the proximal jejunum to the mid distal ileum   FEN: IV fluids/n.p.o./NG decompression ID: None DVT: SCDs added Follow-up: TBD  Plan: Dr. Marylyn Ishihara came in the room same time that  I was there.  I did irrigate the NG and its working well.  Dr. Marylyn Ishihara explained that he has a progression of his tumor. We will plan to continue NG decompression at this time.  Dr. Benay Spice has been following him closely and had discussions with the family about hospice care.  Will review with Dr. Johney Maine.   LOS: 1 day    Sheryl Saintil 04/23/2020 Please see Amion

## 2020-04-23 NOTE — Progress Notes (Addendum)
IP PROGRESS NOTE  Subjective:   Shawn Chambers reports less abdominal pain.  He has output from the NG tube and colostomy.  Objective: Vital signs in last 24 hours: Blood pressure 110/68, pulse 72, temperature 98 F (36.7 C), temperature source Oral, resp. rate 16, SpO2 95 %.  Intake/Output from previous day: 05/11 0701 - 05/12 0700 In: -  Out: 1050 [Emesis/NG output:800; Stool:250]  Physical Exam:  HEENT: NG tube in place Abdomen: Distended, left lower quadrant colostomy with small amount of soft tan stool    Lab Results: Recent Labs    04/22/20 1232 04/23/20 0516  WBC 12.8* 11.2*  HGB 9.6* 9.5*  HCT 29.8* 29.0*  PLT 342 313    BMET Recent Labs    04/22/20 1232 04/23/20 0516  NA 133* 133*  K 4.1 3.6  CL 97* 99  CO2 24 22  GLUCOSE 115* 102*  BUN 30* 28*  CREATININE 1.49* 1.37*  CALCIUM 8.8* 8.6*    Lab Results  Component Value Date   CEA1 52.52 (H) 03/20/2020    Studies/Results: CT ABDOMEN PELVIS WO CONTRAST  Result Date: 04/22/2020 CLINICAL DATA:  History of colon carcinoma and left renal mass with new abdominal distension. EXAM: CT ABDOMEN AND PELVIS WITHOUT CONTRAST TECHNIQUE: Multidetector CT imaging of the abdomen and pelvis was performed following the standard protocol without IV contrast. COMPARISON:  03/26/2020 FINDINGS: Lower chest: Scarring is noted in the bases bilaterally. No focal infiltrate or effusion is seen. Hepatobiliary: No focal liver abnormality is seen. No gallstones, gallbladder wall thickening, or biliary dilatation. Prior granulomatous disease in the liver is noted. Pancreas: Unremarkable. No pancreatic ductal dilatation or surrounding inflammatory changes. Spleen: Normal in size without focal abnormality. Adrenals/Urinary Tract: Adrenal glands are within normal limits. The left kidney has been surgically removed. The right kidney now shows moderate hydronephrosis and hydroureter which extends inferiorly towards the urinary bladder. The  bladder is decompressed but demonstrates significant posterior wall thickening. Stomach/Bowel: Hartmann's pouch is noted distally with changes consistent with a left-sided colostomy. Hartmann's pouch again demonstrates wall thickening similar to that seen on the prior exam and also lies within the amorphous presacral soft tissue density seen previously. The colon is decompressed. There is significant dilatation of the small bowel from the proximal jejunum to the mid to distal ileum with a focal caliber change noted deep within the pelvis adjacent to previously mentioned amorphous soft tissue in the presacral region. This is best visualized on image number 75 of series 2. This represents progression of local disease with involvement of the mid to distal ileum. The distal most ileum is within normal limits just beyond this abnormality. Vascular/Lymphatic: Atherosclerotic calcifications are seen. Mild dilatation of the aorta is again noted and stable up to 4.1 cm. Some intra-aortocaval nodes are again seen and stable. No definitive pelvic sidewall adenopathy is noted. Reproductive: Prostate is enlarged which may contribute to the diffuse bladder wall thickening. Other: No ascites is noted. Fat containing inguinal hernias left greater than right are again noted. Musculoskeletal: Degenerative changes of the lumbar spine are seen. No definitive metastatic lesions are noted. IMPRESSION: Progression of the amorphous density in the presacral region consistent with localized recurrence of the patient's known colon carcinoma. This causes increased hydronephrosis of the right kidney as well as significant small bowel dilatation extending from the proximal jejunum to the mid to distal ileum. The colon beyond the distal ileum is within normal limits. Dilatation of the abdominal aorta. Recommend followup by [place] in 1 year.  This recommendation follows ACR consensus guidelines: White Paper of the ACR Incidental Findings Committee II  on Vascular Findings. J Am Coll Radiol 2013; 10:789-794. Bladder wall thickening with associated enlarged prostate. This is stable from the prior exam. Status post left nephrectomy. Chronic changes as described above. Electronically Signed   By: Inez Catalina M.D.   On: 04/22/2020 19:13   DG Abd 2 Views  Result Date: 04/22/2020 CLINICAL DATA:  Colorectal cancer, increased abdominal distension and pain EXAM: ABDOMEN - 2 VIEW COMPARISON:  11/28/2018 Correlation: CT abdomen and pelvis 03/26/2020 FINDINGS: Air-filled dilated loops of bowel are seen throughout the abdomen into the upper pelvis. These appear to represent a combination of large and small bowel loops. Absent colonic gas. Air-fluid levels on upright view. LEFT mid abdomen ostomy not well visualized. Mild bowel wall thickening of a single loop of bowel in mid abdomen, question transverse colon. No free air. Gaseous distention of sigmoid colon. Bones demineralized with scattered degenerative disc and facet disease changes. Prostatic calcifications seen on RIGHT. IMPRESSION: Dilated air-filled loops of bowel throughout abdomen which appear to represent both large and small bowel, question ileus versus obstruction. Unable to exclude distal colonic obstruction at or near the colostomy in the LEFT mid abdomen Mild bowel wall thickening of transverse colon. Consider CT imaging with IV and oral contrast for further assessment. Electronically Signed   By: Lavonia Dana M.D.   On: 04/22/2020 10:58   DG Abd Portable 1V-Small Bowel Obstruction Protocol-initial, 8 hr delay  Result Date: 04/23/2020 CLINICAL DATA:  Small bowel protocol postcontrast EXAM: PORTABLE ABDOMEN - 1 VIEW COMPARISON:  Apr 23, 2020 1:51 a.m. FINDINGS: NG tube tip is again noted within the proximal stomach. A small amount of residual contrast is seen likely within the stomach. There are mildly prominent air-filled loops of bowel within the mid abdomen measuring up to 5.4 cm. IMPRESSION: Again  noted are multiple dilated loops of bowel, slightly more prominent than the prior exam. Electronically Signed   By: Prudencio Pair M.D.   On: 04/23/2020 03:11   DG Abd Portable 1V  Result Date: 04/23/2020 CLINICAL DATA:  NG tube placement EXAM: PORTABLE ABDOMEN - 1 VIEW COMPARISON:  04/22/2020 FINDINGS: Nasogastric tube tip and side port project over the stomach. There are multiple dilated loops of bowel throughout the abdomen. IMPRESSION: Nasogastric tube tip and side port projecting over the stomach. Electronically Signed   By: Ulyses Jarred M.D.   On: 04/23/2020 02:05    Medications: I have reviewed the patient's current medications.  1. AssesColon cancer, descending colon, stage II (T3 N0), status post a partial left colectomy 03/28/2017 ? MSI-stable, no loss of mismatch repair protein expression ? CT abdomen/pelvis 03/26/2017 and CT chest 03/27/2017-no evidence metastatic disease, abdomen/pelvis CT-long segment of bowel wall thickening in the left colon, partial mechanical obstruction by the area of thickening in the left colon, solid enhancing left renal lesion ? Elevated preoperative CEA ? CT 11/15/2018-irregular rectal wall thickening/perirectal stranding, polypoid defect in the mid transverse colon proximal to the colostomy-indeterminate, nodularity of the prostate with a hyperdense component in the median lobe-stable ? MRI pelvis 02/01/2019-T3BN1 rectal tumor at 4.5 cm from the internal anal sphincter, 11.5 cm in length ? Colonoscopy 02/22/2019-malignant completely obstructing tumor in the proximal rectum. Polyps in the mid and distal rectum; polyps in the ascending, transverse/distal transverse colon. 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 Rectum biopsy shows high-grade dysplasia with focal features suspicious for adenocarcinoma. ? Radiation/Xeloda 03/06/2019-04/12/2019 ?  MRI pelvis 08/17/2019-4.8 cm apple core lesion at the rectosigmoid junction, T3B N0; distance  from tumor to the internal anal sphincter is 11.5 cm.   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 BPH  4. History of tobacco use  5.History of an irregular mole at the right upper back  6. Clinical stage III rectal cancer ? Colonoscopy 02/22/2019-malignant completely obstructing tumor in the proximal rectum. Polyps in the mid and distal rectum; polyps in the ascending, transverse/distal transverse colon. 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 Rectum biopsy shows high-grade dysplasia with focal features suspicious for adenocarcinoma. ? Radiation/Xeloda 03/06/2019-04/12/2019 ? Flexible sigmoidoscopy 06/11/2019-infiltrative and ulcerated completely obstructing large mass found in the rectosigmoid colon/very proximal rectum. Mass circumferential. No bleeding present. Lesion now less fungating but still large in size. 25 mm polyp found in the proximal rectum. 12 mm polyp found in the distal rectum. ? Radiation/Xeloda 03/06/2019-04/12/2019 ? MRI pelvis 08/17/2019-4.8 cm apple core lesion at the rectosigmoid junction, T3B N0; distance from tumor to the internal anal sphincter is 11.5 cm. ? Low anterior resection and repair of parastomal hernia 10/18/2019,[id number],tumor involves the visceral peritoneum, adenocarcinoma present at the serosa of the distal resection margin, lymphovascular invasion present, 1/22 lymph nodes positive, multiple tumor deposits;MSI stable ? Cycle 1 adjuvant Xeloda 12/24/2019, took wrong dose and missed several days, discontinued 01/15/2020 ? Cycle 2 adjuvant Xeloda 01/21/2020 ? Cycle 3 adjuvant Xeloda2/27/2021 ? Cycle 4 adjuvant Xeloda 03/01/2020 ? CTs 03/26/2020-wall thickening in the remaining rectum with a soft tissue mass at the superior rectum, enlarged retroperitoneal lymph nodes, bladder wall thickening, no evidence of metastatic disease in the chest  7. 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 Tumor appears to involve superficial submucosa. Tumor present at cauterized resection margin (polyp base). No evidence of lymphovascular invasion. Other polyp showing tubulovillous adenoma with focal high-grade dysplasia. 8.  Admission 04/22/2020 with symptoms and an abdominal x-ray consistent with a bowel obstruction  CT abdomen/pelvis 04/22/2020-progression of local tumor recurrence in the presacral region, increased right hydronephrosis, small bowel dilatation extending from the proximal jejunum to mid-distal ileum, enlarged prostate with diffuse bladder wall thickening, stable aortocaval nodes  9.  Anemia secondary chronic disease, renal insufficiency, and possibly bleeding from tumor involving the GI/GU tract   Shawn Chambers has recurrent rectal cancer.  He is admitted with a small bowel obstruction.  The bowel obstruction and right hydronephrosis appear related to progression of tumor in the pelvis.  Shawn Chambers has decided against further chemotherapy.  It is possible the bowel obstruction will improve with bowel rest and NG decompression, but unlikely.  Dr. Johney Maine has been consulted.  I discussed the case with Dr. Johney Maine.  He feels Mr. Tiu will not benefit from surgical intervention.  He has a high risk of surgical morbidity and it is unlikely a palliative bypass could be performed.  He recommends placement of a palliative venting gastrostomy.  I will consult interventional radiology.  I attempted to call his daughter at approximately 1:40 PM and she did not answer.  I will try again later today.   The plan is for hospice care at discharge.   LOS: 1 day   Betsy Coder, MD   04/23/2020, 12:06 PM   I discussed the case with his daughter, Judith Blonder, at approximately 2 PM.  She understands the poor prognosis.  I explained the recommendation to proceed with a palliative gastrostomy and hospice  referral.  She would like  [place] to initiate the hospice referral now.  I will place an order for a [facility] hospice referral.

## 2020-04-23 NOTE — Progress Notes (Signed)
Shawn Chambers  PROGRESS NOTE    Shawn Chambers  [id number] DOB: [birth year] DOA: 04/22/2020 PCP: [facility]   Brief Narrative:    Shawn Chambers is a 79 y.o. male with medical history significant of BPH, rectal cancer status post colostomy, GERD, significant hearing deficit and seizure who initially went to see his oncologist Dr. Annitta Chambers for regular follow-up visit where he mentioned having abdominal pain and having no output from his colostomy bag and he ended up having abdominal x-ray which showed possible small or large bowel obstruction.  Hospital service was then called by Dr. Annitta Chambers to requested admission and consultation with general surgery.  Patient seen and examined in the room.  He is very hard of hearing.  Nurse at the bedside.  Patient did endorse having abdominal pain intermittently for last 1 week to the nurse but he denied any pain to me.  According to my discussion with Dr. Annitta Chambers, patient did not have any output from his ostomy for last 2 days however patient tells me that he did have output and in fact he emptied the bag this morning before going to the oncology visit.  He denies any nausea, vomiting or any other complaint however he endorses having decreased appetite.  Per my discussion with his oncologist, patient has declined further chemotherapy and in fact was open to have hospice on board but when I tried to talk to him, he was not sure about that.  He was also having hard time understanding the concept of CODE STATUS and could not really respond/choose between DNR and full code.  I then talked to patient's son Shawn Chambers who deferred me to patient's daughter Shawn Chambers who is the oldest child.  I spoke to [employer] and she expressed her wishes to keep the patient as full code for now.  When discussed about hospice, she was not sure about that however she was agreeable to having palliative care consult.   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 This causes increased hydronephrosis of the right kidney as well as significant small bowel dilatation extending from the proximal jejunum to the mid to distal ileum. The colon beyond the distal ileum is within normal limits.     - NGT is in place and set to [facility]. Stool is noted in collection jug.     - Awaiting final surgical recommendations     - Spoke with Onco (Dr. Benay Spice); he has previous spoken with dtr about hospice. He will talk with them again. He recs a couple days of fluids and NG and follow up surgical recs.   BPH     - Remains NPO; however, his flomax was resumed  CKD stage IIIa     - SCr is at baseline; continue to monitor  Right hydronephrosis     - as per imaging above; follow I&Os and SCr; progress will be dependent on what we do with the mass  Clinical stage III rectal cancer with diverting colostomy     - Has output in the colostomy bag.       - Further management per oncology and general surgery.  Normocytic anemia     - no evidenc of bleed, monitor  Mild hyponatremia     - monitor Na+  DVT prophylaxis: lovenox Code Status: FULL  Status is: Inpatient  Remains inpatient appropriate because:IV treatments appropriate due to intensity of illness or inability to take  PO   Dispo: The patient is from: Home              Anticipated d/c is to: Home              Anticipated d/c date is: > 3 days              Patient currently is not medically stable to d/c.  Consultants:   General Surgery  Oncology  Procedures:   NGT placement.  ROS:  Denies N, ab pain . Remainder 10-pt ROS is negative for all not previously mentioned.  Subjective: "That sounds fine."  Objective: Vitals:   04/22/20 1522 04/22/20 2153 04/23/20 0653  BP: 110/71 115/73 110/68  Pulse: 74 78 72  Resp: 17 17 16   Temp: 98.1 F (36.7 C) 98.2 F (36.8 C) 98 F (36.7 C)  TempSrc: Oral Oral Oral  SpO2: 96% 95% 95%    Intake/Output Summary (Last 24 hours) at 04/23/2020 1121 Last data filed at 04/23/2020 0551 Gross per 24 hour  Intake --  Output 1050 ml  Net -1050 ml   There were no vitals filed for this visit.  Examination:  General: 79 y.o. male resting in bed in NAD Cardiovascular: RRR, +S1, S2, no m/g/r, equal pulses throughout Respiratory: CTABL, no w/r/r, normal WOB GI: BS+, NT, distended, ostomy noted MSK: No e/c/c Neuro: alert to name, follows commands Psyc: Appropriate interaction but flat affect, calm/cooperative   Data Reviewed: I have personally reviewed following labs and imaging studies.  CBC: Recent Labs  Lab 04/22/20 1232 04/23/20 0516  WBC 12.8* 11.2*  HGB 9.6* 9.5*  HCT 29.8* 29.0*  MCV 90.3 89.2  PLT 342 [id number]   Basic Metabolic Panel: Recent Labs  Lab 04/22/20 1232 04/23/20 0516  NA 133* 133*  K 4.1 3.6  CL 97* 99  CO2 24 22  GLUCOSE 115* 102*  BUN 30* 28*  CREATININE 1.49* 1.37*  CALCIUM 8.8* 8.6*  MG 2.2  --    GFR: Estimated Creatinine Clearance: 45.1 mL/min (A) (by C-G formula based on SCr of 1.37 mg/dL (H)). Liver Function Tests: No results for input(s): AST, ALT, ALKPHOS, BILITOT, PROT, ALBUMIN in the last 168 hours. No results for input(s): LIPASE, AMYLASE in the last 168 hours. No results for input(s): AMMONIA in the  last 168 hours. Coagulation Profile: Recent Labs  Lab 04/22/20 1232  INR 1.2   Cardiac Enzymes: No results for input(s): CKTOTAL, CKMB, CKMBINDEX, TROPONINI in the last 168 hours. BNP (last 3 results) No results for input(s): PROBNP in the last 8760 hours. HbA1C: No results for input(s): HGBA1C in the last 72 hours. CBG: No results for input(s): GLUCAP in the last 168 hours. Lipid Profile: No results for input(s): CHOL, HDL, LDLCALC, TRIG, CHOLHDL, LDLDIRECT in the last 72 hours. Thyroid Function Tests: Recent Labs    04/22/20  1232  TSH 1.020   Anemia Panel: No results for input(s): VITAMINB12, FOLATE, FERRITIN, TIBC, IRON, RETICCTPCT in the last 72 hours. Sepsis Labs: No results for input(s): PROCALCITON, LATICACIDVEN in the last 168 hours.  No results found for this or any previous visit (from the past 240 hour(s)).    Radiology Studies: CT ABDOMEN PELVIS WO CONTRAST  Result Date: 04/22/2020 CLINICAL DATA:  History of colon carcinoma and left renal mass with new abdominal distension. EXAM: CT ABDOMEN AND PELVIS WITHOUT CONTRAST TECHNIQUE: Multidetector CT imaging of the abdomen and pelvis was performed following the standard protocol without IV contrast. COMPARISON:  03/26/2020 FINDINGS: Lower chest: Scarring is noted in the bases bilaterally. No focal infiltrate or effusion is seen. Hepatobiliary: No focal liver abnormality is seen. No gallstones, gallbladder wall thickening, or biliary dilatation. Prior granulomatous disease in the liver is noted. Pancreas: Unremarkable. No pancreatic ductal dilatation or surrounding inflammatory changes. Spleen: Normal in size without focal abnormality. Adrenals/Urinary Tract: Adrenal glands are within normal limits. The left kidney has been surgically removed. The right kidney now shows moderate hydronephrosis and hydroureter which extends inferiorly towards the urinary bladder. The bladder is decompressed but demonstrates significant  posterior wall thickening. Stomach/Bowel: Hartmann's pouch is noted distally with changes consistent with a left-sided colostomy. Hartmann's pouch again demonstrates wall thickening similar to that seen on the prior exam and also lies within the amorphous presacral soft tissue density seen previously. The colon is decompressed. There is significant dilatation of the small bowel from the proximal jejunum to the mid to distal ileum with a focal caliber change noted deep within the pelvis adjacent to previously mentioned amorphous soft tissue in the presacral region. This is best visualized on image number 75 of series 2. This represents progression of local disease with involvement of the mid to distal ileum. The distal most ileum is within normal limits just beyond this abnormality. Vascular/Lymphatic: Atherosclerotic calcifications are seen. Mild dilatation of the aorta is again noted and stable up to 4.1 cm. Some intra-aortocaval nodes are again seen and stable. No definitive pelvic sidewall adenopathy is noted. Reproductive: Prostate is enlarged which may contribute to the diffuse bladder wall thickening. Other: No ascites is noted. Fat containing inguinal hernias left greater than right are again noted. Musculoskeletal: Degenerative changes of the lumbar spine are seen. No definitive metastatic lesions are noted. IMPRESSION: Progression of the amorphous density in the presacral region consistent with localized recurrence of the patient's known colon carcinoma. This causes increased hydronephrosis of the right kidney as well as significant small bowel dilatation extending from the proximal jejunum to the mid to distal ileum. The colon beyond the distal ileum is within normal limits. Dilatation of the abdominal aorta. Recommend followup by [place] in 1 year. This recommendation follows ACR consensus guidelines: White Paper of the ACR Incidental Findings Committee II on Vascular Findings. J Am Coll Radiol 2013;  10:789-794. Bladder wall thickening with associated enlarged prostate. This is stable from the prior exam. Status post left nephrectomy. Chronic changes as described above. Electronically Signed   By: Inez Catalina M.D.   On: 04/22/2020 19:13   DG Abd 2 Views  Result Date: 04/22/2020 CLINICAL DATA:  Colorectal cancer, increased abdominal distension and pain EXAM: ABDOMEN - 2 VIEW COMPARISON:  11/28/2018 Correlation: CT abdomen and pelvis 03/26/2020 FINDINGS: Air-filled dilated loops of bowel are seen throughout the abdomen into the upper pelvis. These appear to represent a combination of large and small bowel loops. Absent colonic gas. Air-fluid levels  on upright view. LEFT mid abdomen ostomy not well visualized. Mild bowel wall thickening of a single loop of bowel in mid abdomen, question transverse colon. No free air. Gaseous distention of sigmoid colon. Bones demineralized with scattered degenerative disc and facet disease changes. Prostatic calcifications seen on RIGHT. IMPRESSION: Dilated air-filled loops of bowel throughout abdomen which appear to represent both large and small bowel, question ileus versus obstruction. Unable to exclude distal colonic obstruction at or near the colostomy in the LEFT mid abdomen Mild bowel wall thickening of transverse colon. Consider CT imaging with IV and oral contrast for further assessment. Electronically Signed   By: Lavonia Dana M.D.   On: 04/22/2020 10:58   DG Abd Portable 1V-Small Bowel Obstruction Protocol-initial, 8 hr delay  Result Date: 04/23/2020 CLINICAL DATA:  Small bowel protocol postcontrast EXAM: PORTABLE ABDOMEN - 1 VIEW COMPARISON:  Apr 23, 2020 1:51 a.m. FINDINGS: NG tube tip is again noted within the proximal stomach. A small amount of residual contrast is seen likely within the stomach. There are mildly prominent air-filled loops of bowel within the mid abdomen measuring up to 5.4 cm. IMPRESSION: Again noted are multiple dilated loops of bowel,  slightly more prominent than the prior exam. Electronically Signed   By: Prudencio Pair M.D.   On: 04/23/2020 03:11   DG Abd Portable 1V  Result Date: 04/23/2020 CLINICAL DATA:  NG tube placement EXAM: PORTABLE ABDOMEN - 1 VIEW COMPARISON:  04/22/2020 FINDINGS: Nasogastric tube tip and side port project over the stomach. There are multiple dilated loops of bowel throughout the abdomen. IMPRESSION: Nasogastric tube tip and side port projecting over the stomach. Electronically Signed   By: Ulyses Jarred M.D.   On: 04/23/2020 02:05     Scheduled Meds: . enoxaparin (LOVENOX) injection  40 mg Subcutaneous Q24H  . latanoprost  1 drop Both Eyes QHS  . lip balm  1 application Topical BID  . sodium chloride flush  3 mL Intravenous Q12H  . tamsulosin  0.8 mg Oral QHS   Continuous Infusions: . sodium chloride 100 mL/hr at 04/22/20 1327  . lactated ringers    . methocarbamol (ROBAXIN) IV    . ondansetron (ZOFRAN) IV       LOS: 1 day    Time spent: 25 minutes spent in the coordination of care today.    Jonnie Finner, DO [facility]  If 7PM-7AM, please contact night-coverage [email or web address] 04/23/2020, 11:21 AM

## 2020-04-23 NOTE — Progress Notes (Signed)
Patient denies much pain.  Still massively distended. No definite peritonitis. Feculent nasogastric tube output persists  Difficult situation. High-grade bowel obstruction with massive distention and  consistent with significant blockage.  Transition point is small intestine adherent to pelvic mass at site of rectal stump and bladder wall thickening consistent with recurrent disease.  To try do surgery will require a large incision since he is too distended for laparoscopic approach. With prior colectomy, nephrectomy, and low anterior resection; moderate to severe adhesions expected. Most likely would require bowel resection since expect tumor invading into small intestine causing the blockage. With him malnourished and distended, definite increased risk of leak. Still leaves pelvic tumor in place that is not going to be able to be resectable with imminent progression and probable reobstruction.  I believe surgery is not a good option for him only cause harm. In this setting, we recommend gastrostomy tube placement so we can get the NG tube out and transition home with hospice for venting and more comfort care since patient declines any post adjuvant therapy still.  I had been paged by the nurse that family was at the bedside while I was operating. I finished in the operating room an hour and a half later and came up to the floor but family was not there. Discussed with Mr. Rayann Heman with interventional radiology that they may be getting a consultation for this. Hopefully they can place percutaneously and avoid lapartotomy to place the G-tube since he is so distended a lap approach would not be feasible.  Discussed with the patient's nurse as well. I called discussed with Dr. Benay Spice. He will discuss with the daughter about our recommendations. I can discuss with family further if they wish.  Adin Hector, MD, FACS, MASCRS Gastrointestinal and Minimally Invasive Surgery  [facility]  Surgery [address] [place], [place] [address] [phone] Fax [phone] Main/Paging  CONTACT INFORMATION: Weekday (9AM-5PM) concerns: Call [facility] main office at [phone] Weeknight (5PM-9AM) or Weekend/Holiday concerns: Check [email or web address] for General Surgery [facility] coverage (Please, do not use SecureChat as it is not reliable communication to surgeons for patient care)

## 2020-04-24 ENCOUNTER — Inpatient Hospital Stay (HOSPITAL_COMMUNITY): Payer: Medicare Other

## 2020-04-24 HISTORY — PX: IR GASTROSTOMY TUBE MOD SED: IMG625

## 2020-04-24 LAB — MAGNESIUM: Magnesium: 2 mg/dL (ref 1.7–2.4)

## 2020-04-24 LAB — RENAL FUNCTION PANEL
Albumin: 2.7 g/dL — ABNORMAL LOW (ref 3.5–5.0)
Anion gap: 12 (ref 5–15)
BUN: 27 mg/dL — ABNORMAL HIGH (ref 8–23)
CO2: 21 mmol/L — ABNORMAL LOW (ref 22–32)
Calcium: 8.4 mg/dL — ABNORMAL LOW (ref 8.9–10.3)
Chloride: 103 mmol/L (ref 98–111)
Creatinine, Ser: 1.29 mg/dL — ABNORMAL HIGH (ref 0.61–1.24)
GFR calc Af Amer: >60 mL/min (ref >60)
GFR calc non Af Amer: 52 mL/min — ABNORMAL LOW (ref >60)
Glucose, Bld: 74 mg/dL (ref 70–99)
Phosphorus: 3.3 mg/dL (ref 2.5–4.6)
Potassium: 3.5 mmol/L (ref 3.5–5.1)
Sodium: 136 mmol/L (ref 135–145)

## 2020-04-24 LAB — CBC WITH DIFFERENTIAL/PLATELET
Abs Immature Granulocytes: 0.03 10*3/uL (ref 0.00–0.07)
Basophils Absolute: 0 10*3/uL (ref 0.0–0.1)
Basophils Relative: 0 %
Eosinophils Absolute: 0.1 10*3/uL (ref 0.0–0.5)
Eosinophils Relative: 1 %
HCT: 30 % — ABNORMAL LOW (ref 39.0–52.0)
Hemoglobin: 9.3 g/dL — ABNORMAL LOW (ref 13.0–17.0)
Immature Granulocytes: 0 %
Lymphocytes Relative: 7 %
Lymphs Abs: 0.7 10*3/uL (ref 0.7–4.0)
MCH: 28.6 pg (ref 26.0–34.0)
MCHC: 31 g/dL (ref 30.0–36.0)
MCV: 92.3 fL (ref 80.0–100.0)
Monocytes Absolute: 1 10*3/uL (ref 0.1–1.0)
Monocytes Relative: 10 %
Neutro Abs: 8.3 10*3/uL — ABNORMAL HIGH (ref 1.7–7.7)
Neutrophils Relative %: 82 %
Platelets: 347 10*3/uL (ref 150–400)
RBC: 3.25 MIL/uL — ABNORMAL LOW (ref 4.22–5.81)
RDW: 17.6 % — ABNORMAL HIGH (ref 11.5–15.5)
WBC: 10.1 10*3/uL (ref 4.0–10.5)
nRBC: 0 % (ref 0.0–0.2)

## 2020-04-24 MED ORDER — FENTANYL CITRATE (PF) 100 MCG/2ML IJ SOLN
INTRAMUSCULAR | Status: AC | PRN
  Administered 2020-04-24 (×2): 50 ug via INTRAVENOUS

## 2020-04-24 MED ORDER — IOHEXOL 300 MG/ML  SOLN
50.0000 mL | Freq: Once | INTRAMUSCULAR | Status: AC | PRN
  Administered 2020-04-24: 20 mL

## 2020-04-24 MED ORDER — GLUCAGON HCL (RDNA) 1 MG IJ SOLR
INTRAMUSCULAR | Status: AC | PRN
  Administered 2020-04-24: .5 mL via INTRAVENOUS

## 2020-04-24 MED ORDER — LIDOCAINE HCL 1 % IJ SOLN
INTRAMUSCULAR | Status: AC
  Filled 2020-04-24: qty 20

## 2020-04-24 MED ORDER — MIDAZOLAM HCL 2 MG/2ML IJ SOLN
INTRAMUSCULAR | Status: AC
  Filled 2020-04-24: qty 4

## 2020-04-24 MED ORDER — CEFAZOLIN SODIUM-DEXTROSE 2-4 GM/100ML-% IV SOLN
INTRAVENOUS | Status: AC
  Administered 2020-04-24: 2 g via INTRAVENOUS
  Filled 2020-04-24: qty 100

## 2020-04-24 MED ORDER — LIDOCAINE HCL (PF) 1 % IJ SOLN
INTRAMUSCULAR | Status: AC | PRN
  Administered 2020-04-24: 10 mL

## 2020-04-24 MED ORDER — MIDAZOLAM HCL 2 MG/2ML IJ SOLN
INTRAMUSCULAR | Status: AC | PRN
  Administered 2020-04-24 (×3): 1 mg via INTRAVENOUS

## 2020-04-24 MED ORDER — GLUCAGON HCL RDNA (DIAGNOSTIC) 1 MG IJ SOLR
INTRAMUSCULAR | Status: AC
  Filled 2020-04-24: qty 1

## 2020-04-24 MED ORDER — FENTANYL CITRATE (PF) 100 MCG/2ML IJ SOLN
INTRAMUSCULAR | Status: AC
  Filled 2020-04-24: qty 2

## 2020-04-24 NOTE — Progress Notes (Addendum)
IP PROGRESS NOTE  Subjective:   Denies abdominal pain, nausea, vomiting this morning.  Having some output through his NG tube and colostomy.  For palliative G-tube placement by IR later today.  Objective: Vital signs in last 24 hours: Blood pressure 112/61, pulse 78, temperature 98.2 F (36.8 C), temperature source Oral, resp. rate 20, height '5\' 10"'  (1.778 m), weight 80.2 kg, SpO2 96 %.  Intake/Output from previous day: 05/12 0701 - 05/13 0700 In: 800 [I.V.:800] Out: 1501 [Urine:1; Emesis/NG output:1500]  Physical Exam:  HEENT: NG tube in place Abdomen: Distended, left lower quadrant colostomy with small amount of soft tan stool    Lab Results: Recent Labs    04/23/20 0516 04/24/20 0524  WBC 11.2* 10.1  HGB 9.5* 9.3*  HCT 29.0* 30.0*  PLT 313 347    BMET Recent Labs    04/23/20 0516 04/24/20 0524  NA 133* 136  K 3.6 3.5  CL 99 103  CO2 22 21*  GLUCOSE 102* 74  BUN 28* 27*  CREATININE 1.37* 1.29*  CALCIUM 8.6* 8.4*    Lab Results  Component Value Date   CEA1 52.52 (H) 03/20/2020    Studies/Results: CT ABDOMEN PELVIS WO CONTRAST  Result Date: 04/22/2020 CLINICAL DATA:  History of colon carcinoma and left renal mass with new abdominal distension. EXAM: CT ABDOMEN AND PELVIS WITHOUT CONTRAST TECHNIQUE: Multidetector CT imaging of the abdomen and pelvis was performed following the standard protocol without IV contrast. COMPARISON:  03/26/2020 FINDINGS: Lower chest: Scarring is noted in the bases bilaterally. No focal infiltrate or effusion is seen. Hepatobiliary: No focal liver abnormality is seen. No gallstones, gallbladder wall thickening, or biliary dilatation. Prior granulomatous disease in the liver is noted. Pancreas: Unremarkable. No pancreatic ductal dilatation or surrounding inflammatory changes. Spleen: Normal in size without focal abnormality. Adrenals/Urinary Tract: Adrenal glands are within normal limits. The left kidney has been surgically removed.  The right kidney now shows moderate hydronephrosis and hydroureter which extends inferiorly towards the urinary bladder. The bladder is decompressed but demonstrates significant posterior wall thickening. Stomach/Bowel: Hartmann's pouch is noted distally with changes consistent with a left-sided colostomy. Hartmann's pouch again demonstrates wall thickening similar to that seen on the prior exam and also lies within the amorphous presacral soft tissue density seen previously. The colon is decompressed. There is significant dilatation of the small bowel from the proximal jejunum to the mid to distal ileum with a focal caliber change noted deep within the pelvis adjacent to previously mentioned amorphous soft tissue in the presacral region. This is best visualized on image number 75 of series 2. This represents progression of local disease with involvement of the mid to distal ileum. The distal most ileum is within normal limits just beyond this abnormality. Vascular/Lymphatic: Atherosclerotic calcifications are seen. Mild dilatation of the aorta is again noted and stable up to 4.1 cm. Some intra-aortocaval nodes are again seen and stable. No definitive pelvic sidewall adenopathy is noted. Reproductive: Prostate is enlarged which may contribute to the diffuse bladder wall thickening. Other: No ascites is noted. Fat containing inguinal hernias left greater than right are again noted. Musculoskeletal: Degenerative changes of the lumbar spine are seen. No definitive metastatic lesions are noted. IMPRESSION: Progression of the amorphous density in the presacral region consistent with localized recurrence of the patient's known colon carcinoma. This causes increased hydronephrosis of the right kidney as well as significant small bowel dilatation extending from the proximal jejunum to the mid to distal ileum. The colon beyond the  distal ileum is within normal limits. Dilatation of the abdominal aorta. Recommend followup by  [place] in 1 year. This recommendation follows ACR consensus guidelines: White Paper of the ACR Incidental Findings Committee II on Vascular Findings. J Am Coll Radiol 2013; 10:789-794. Bladder wall thickening with associated enlarged prostate. This is stable from the prior exam. Status post left nephrectomy. Chronic changes as described above. Electronically Signed   By: Inez Catalina M.D.   On: 04/22/2020 19:13   DG Abd 1 View  Result Date: 04/24/2020 CLINICAL DATA:  Small bowel obstruction. EXAM: ABDOMEN - 1 VIEW COMPARISON:  Abdominal x-ray from yesterday. FINDINGS: Unchanged enteric tube. Multiple dilated loops of air-filled small bowel measuring up to 5.6 cm are unchanged. No radio-opaque calculi or other significant radiographic abnormality are seen. No acute osseous abnormality. IMPRESSION: Unchanged small bowel obstruction. Electronically Signed   By: Titus Dubin M.D.   On: 04/24/2020 07:15   DG Abd 2 Views  Result Date: 04/22/2020 CLINICAL DATA:  Colorectal cancer, increased abdominal distension and pain EXAM: ABDOMEN - 2 VIEW COMPARISON:  11/28/2018 Correlation: CT abdomen and pelvis 03/26/2020 FINDINGS: Air-filled dilated loops of bowel are seen throughout the abdomen into the upper pelvis. These appear to represent a combination of large and small bowel loops. Absent colonic gas. Air-fluid levels on upright view. LEFT mid abdomen ostomy not well visualized. Mild bowel wall thickening of a single loop of bowel in mid abdomen, question transverse colon. No free air. Gaseous distention of sigmoid colon. Bones demineralized with scattered degenerative disc and facet disease changes. Prostatic calcifications seen on RIGHT. IMPRESSION: Dilated air-filled loops of bowel throughout abdomen which appear to represent both large and small bowel, question ileus versus obstruction. Unable to exclude distal colonic obstruction at or near the colostomy in the LEFT mid abdomen Mild bowel wall thickening of  transverse colon. Consider CT imaging with IV and oral contrast for further assessment. Electronically Signed   By: Lavonia Dana M.D.   On: 04/22/2020 10:58   DG Abd Portable 1V-Small Bowel Obstruction Protocol-initial, 8 hr delay  Result Date: 04/23/2020 CLINICAL DATA:  Small bowel protocol postcontrast EXAM: PORTABLE ABDOMEN - 1 VIEW COMPARISON:  Apr 23, 2020 1:51 a.m. FINDINGS: NG tube tip is again noted within the proximal stomach. A small amount of residual contrast is seen likely within the stomach. There are mildly prominent air-filled loops of bowel within the mid abdomen measuring up to 5.4 cm. IMPRESSION: Again noted are multiple dilated loops of bowel, slightly more prominent than the prior exam. Electronically Signed   By: Prudencio Pair M.D.   On: 04/23/2020 03:11   DG Abd Portable 1V  Result Date: 04/23/2020 CLINICAL DATA:  NG tube placement EXAM: PORTABLE ABDOMEN - 1 VIEW COMPARISON:  04/22/2020 FINDINGS: Nasogastric tube tip and side port project over the stomach. There are multiple dilated loops of bowel throughout the abdomen. IMPRESSION: Nasogastric tube tip and side port projecting over the stomach. Electronically Signed   By: Ulyses Jarred M.D.   On: 04/23/2020 02:05    Medications: I have reviewed the patient's current medications.  1. AssesColon cancer, descending colon, stage II (T3 N0), status post a partial left colectomy 03/28/2017 ? MSI-stable, no loss of mismatch repair protein expression ? CT abdomen/pelvis 03/26/2017 and CT chest 03/27/2017-no evidence metastatic disease, abdomen/pelvis CT-long segment of bowel wall thickening in the left colon, partial mechanical obstruction by the area of thickening in the left colon, solid enhancing left renal lesion ? Elevated preoperative CEA ?  CT 11/15/2018-irregular rectal wall thickening/perirectal stranding, polypoid defect in the mid transverse colon proximal to the colostomy-indeterminate, nodularity of the prostate with a  hyperdense component in the median lobe-stable ? MRI pelvis 02/01/2019-T3BN1 rectal tumor at 4.5 cm from the internal anal sphincter, 11.5 cm in length ? Colonoscopy 02/22/2019-malignant completely obstructing tumor in the proximal rectum. Polyps in the mid and distal rectum; polyps in the ascending, transverse/distal transverse colon. 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 Rectum biopsy shows high-grade dysplasia with focal features suspicious for adenocarcinoma. ? Radiation/Xeloda 03/06/2019-04/12/2019 ? MRI pelvis 08/17/2019-4.8 cm apple core lesion at the rectosigmoid junction, T3B N0; distance from tumor to the internal anal sphincter is 11.5 cm.   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 BPH  4. History of tobacco use  5.History of an irregular mole at the right upper back  6. Clinical stage III rectal cancer ? Colonoscopy 02/22/2019-malignant completely obstructing tumor in the proximal rectum. Polyps in the mid and distal rectum; polyps in the ascending, transverse/distal transverse colon. 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 Rectum biopsy shows high-grade dysplasia with focal features suspicious for adenocarcinoma. ? Radiation/Xeloda 03/06/2019-04/12/2019 ? Flexible sigmoidoscopy 06/11/2019-infiltrative and ulcerated completely obstructing large mass found in the rectosigmoid colon/very proximal rectum. Mass circumferential. No bleeding present. Lesion now less fungating but still large in size. 25 mm polyp found in the proximal rectum. 12 mm polyp found in the distal rectum. ? Radiation/Xeloda 03/06/2019-04/12/2019 ? MRI pelvis 08/17/2019-4.8 cm apple core lesion at the rectosigmoid junction, T3B N0; distance from tumor to the internal anal sphincter is 11.5 cm. ? Low anterior resection and repair of parastomal hernia 10/18/2019,pT4pN1a,tumor involves the visceral peritoneum, adenocarcinoma present at the  serosa of the distal resection margin, lymphovascular invasion present, 1/22 lymph nodes positive, multiple tumor deposits;MSI stable ? Cycle 1 adjuvant Xeloda 12/24/2019, took wrong dose and missed several days, discontinued 01/15/2020 ? Cycle 2 adjuvant Xeloda 01/21/2020 ? Cycle 3 adjuvant Xeloda2/27/2021 ? Cycle 4 adjuvant Xeloda 03/01/2020 ? CTs 03/26/2020-wall thickening in the remaining rectum with a soft tissue mass at the superior rectum, enlarged retroperitoneal lymph nodes, bladder wall thickening, no evidence of metastatic disease in the chest  7. 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 Tumor appears to involve superficial submucosa. Tumor present at cauterized resection margin (polyp base). No evidence of lymphovascular invasion. Other polyp showing tubulovillous adenoma with focal high-grade dysplasia. 8.  Admission 04/22/2020 with symptoms and an abdominal x-ray consistent with a bowel obstruction  CT abdomen/pelvis 04/22/2020-progression of local tumor recurrence in the presacral region, increased right hydronephrosis, small bowel dilatation extending from the proximal jejunum to mid-distal ileum, enlarged prostate with diffuse bladder wall thickening, stable aortocaval nodes  9.  Anemia secondary chronic disease, renal insufficiency, and possibly bleeding from tumor involving the GI/GU tract   Mr. Flett appears stable.  General surgery has evaluated and he is not a surgical candidate.  For palliative G-tube placement later today by IR.  He has decided against further chemotherapy.  Discussed with the patient and the family, and he is agreeable to discharge home with hospice once he is medically stable.  TOC consult was placed on 04/23/2020 to make arrangements for hospice of [facility] to follow the patient once he is discharged.  Recommendations: 1.  Proceed with  palliative G-tube placement today by IR. 2.  Home with hospice once he is medically stable.   LOS: 2 days   Erasmo Downer  Curcio, NP  Mr. Gell was interviewed and examined.  He reports improvement in abdominal distention.  He is scheduled for palliative gastrostomy tube placement today.  I discussed CPR and ACLS with Mr. Passage.  He agrees to a no CODE BLUE status.  I discussed the case with his daughter by telephone.  She understands the plan is for discharge to home with hospice care tomorrow.  We discussed management of the venting gastrostomy.  We also discussed CODE STATUS.  She agrees with a no CODE BLUE status.  Mr. Gettis will be scheduled for follow-up at the cancer center in approximately 2 weeks. 04/24/2020, 8:49 AM

## 2020-04-24 NOTE — Procedures (Signed)
Interventional Radiology Procedure Note  Procedure: fluoro 14 fr balloon retention Gtube  Complications: None  Estimated Blood Loss: min  Findings: Unable to connect access for pull thru Gtube.  So converted to 14 fr balloon retention Gtube

## 2020-04-24 NOTE — TOC Initial Note (Signed)
Transition of Care [facility]) - Initial/Assessment Note    Patient Details  Name: Shawn Chambers MRN: [id number] Date of Birth: 1941-05-29  [facility] CM/SW Contact:    Lynnell Catalan, RN Phone Number: 04/24/2020, 11:46 AM  Clinical Narrative:                 [facility] consult for referral to [place]. This CM contacted hospice liaison and referral given. Pt from home with son. He also has other children to help at home. Pt to have venting G-tube placed today. TOC will continue to follow.  Expected Discharge Plan: Home w Hospice Care Barriers to Discharge: Continued Medical Work up   Expected Discharge Plan and Services Expected Discharge Plan: [place]   Discharge Planning Services: CM Consult   Living arrangements for the past 2 months: Single Family Home       HH Arranged: Disease Management [place] Agency: [facility] Date [place]: 04/24/20 Time [place]: 1145 Representative spoke with at [address]: [place]  Prior Living Arrangements/Services Living arrangements for the past 2 months: [place] with:: Adult Children          Need for Family Participation in Patient Care: Yes (Comment) Care giver support system in place?: Yes (comment)      Activities of Daily Living Home Assistive Devices/Equipment: None ADL Screening (condition at time of admission) Patient's cognitive ability adequate to safely complete daily activities?: Yes Is the patient deaf or have difficulty hearing?: Yes Does the patient have difficulty seeing, even when wearing glasses/contacts?: No Does the patient have difficulty concentrating, remembering, or making decisions?: No Patient able to express need for assistance with ADLs?: Yes Does the patient have difficulty dressing or bathing?: Yes Independently performs ADLs?: No Communication: Independent, Appropriate for developmental age Dressing (OT): Independent,  Appropriate for developmental age Grooming: 90, Appropriate for developmental age Feeding: Independent, Appropriate for developmental age Bathing: Needs assistance Is this a change from baseline?: Pre-admission baseline Toileting: Independent, Appropriate for developmental age In/Out Bed: Independent, Appropriate for developmental age 29 in Home: Independent, Appropriate for developmental age Does the patient have difficulty walking or climbing stairs?: No Weakness of Legs: None Weakness of Arms/Hands: None   Admission diagnosis:  Small bowel obstruction [facility]) [K56.609] Patient Active Problem List   Diagnosis Date Noted  . Small bowel obstruction ([place]) 04/22/2020  . Antineoplastic chemotherapy regimen declined 04/22/2020  . Constipation, chronic 04/22/2020  . Abdominal pain 04/22/2020  . Goals of care, counseling/discussion 04/01/2020  . Hyperglycemia 10/21/2019  . History of Noncompliance with treatment plan 10/20/2019  . 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 Anemia of chronic disease 10/20/2019  . HOH (hard of hearing) 10/20/2019  . History of seizures   . Glaucoma   . BPH (benign prostatic hyperplasia)   . 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 Metastatic Rectal cancer pT4pN1a  10/18/2019  . 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 GERD (gastroesophageal reflux disease) 03/28/2017  . 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 SBO (small bowel obstruction) adhesions to pelvic recurrence 03/26/2017   PCP:  [place], [place]:   [place], [address] [place] [address] Phone: [phone] Fax: [phone]  [id number] - [place], [place] [phone]  [place] [address] Phone: [phone] Fax:  [place], [place] - [address] [place] [address] Phone: [phone] Fax: [phone]     Social Determinants of Health (SDOH) Interventions    Readmission Risk Interventions Readmission Risk Prevention Plan 04/24/2020  Transportation Screening Complete  PCP or Specialist Appt within 3-5 Days Complete  HRI or [place] Complete  Social Work Consult for [place] Planning/Counseling Complete  Palliative Care Screening Complete  Medication Review [employer]) Complete  Some recent data might be hidden

## 2020-04-24 NOTE — Progress Notes (Signed)
Shawn Chambers  PROGRESS NOTE    Shawn Chambers  [id number] DOB: [birth year] DOA: 04/22/2020 PCP: [name]   Brief Narrative:   Shawn Chambers a 79 y.o.malewith medical history significant ofBPH, rectal cancer status post colostomy, GERD, significant hearing deficit and seizure who initially went to see his oncologist Dr. Ronny Bacon regular follow-up visit where he mentioned having abdominal pain and having no output from his colostomy bag and he ended up having abdominal x-ray which showed possible small or large bowel obstruction. Hospital service was then called by Dr. Charlene Brooke requested admission and consultation with general surgery.  Patient seen and examined in the room. He is very hard of hearing. Nurse at the bedside. Patient did endorse having abdominal pain intermittently for last 1 week to the nurse but he denied any pain to me. According to my discussion with Dr. Stephens November did not have any output from his ostomy for last 2 days however patient tells me that he did have output and in fact he emptied the bag this morning before going to the oncology visit. He denies any nausea, vomiting or any other complaint however he endorses having decreased appetite. Per my discussion with his oncologist, patient has declined further chemotherapy and in fact was open to have hospice on board but when I tried to talk to him, he was not sure about that.He was also having hard time understanding the concept of CODE STATUS and could not really respond/choose between DNR and full code. I then talked to patient's son Shawn Chambers who deferred me to patient's daughter Shawn Chambers is the oldest child. I spoke to [facility] she expressed her wishes to keep the patient as full code for now. When discussed about hospice, she was not sure about that however she was agreeable to having palliative care consult.  5/13: No surgical intervention planned. Patient will have venting PEG placed  and go home with hospice. Awaiting final confirmation that everything is set up. He is stable otherwise.    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 This causes increased hydronephrosis of the right kidney as well as significant small bowel dilatation extending from the proximal jejunum to the mid to distal ileum. The colon beyond the distal ileum is within normal limits.     - NGT is in place and set to [facility]. Stool is noted in collection jug.     - Awaiting final surgical recommendations     - Spoke with Onco (Dr. Benay Spice); he has previous spoken with dtr about hospice. He will talk with them again. He recs a couple days of fluids and NG and follow up surgical recs.      - 5/13: to have venting PEG placed today. He will go home with hospice. CM working on arrangements.  BPH     - Remains NPO; however, his flomax was resumed  CKD stage IIIa     - SCr is at baseline; continue to monitor  Right hydronephrosis     - as per imaging above; follow I&Os and SCr; progress will be dependent on what we do with the mass  Clinical stage III rectal cancer with diverting colostomy     - Has output in the colostomy bag.       - Further management per oncology and general  surgery.     - 5/13: to have venting PEG placed today. He will go home with hospice.  Normocytic anemia     - no evidenc of bleed, monitor     - 5/13: stable, monitor  Mild hyponatremia     - monitor Na+     - 5/13: resolved  DVT prophylaxis: lovenox Code Status: FULL   Status is: Inpatient  Remains inpatient appropriate because:needing venting PEG prior to discharge and hospice set up.   Dispo: The patient is from: Home              Anticipated d/c is to: Home              Anticipated d/c date is: 1 day              Patient currently is medically stable to d/c.  Consultants:   General Surgery  Heme/Onc  Procedures:   PEG   ROS:  Denies CP, ab pain, dyspnea . Remainder 10-pt ROS is negative for all not previously mentioned.  Subjective: "When am I getting this thing?"  Objective: Vitals:   04/24/20 1540 04/24/20 1545 04/24/20 1613 04/24/20 1644  BP: 131/60 (!) 119/58 121/60 (!) 121/59  Pulse: 71 72 68 63  Resp: 20 19 16 16   Temp:   98.3 F (36.8 C)   TempSrc:   Oral   SpO2: 98% 97% 91% 94%  Weight:      Height:        Intake/Output Summary (Last 24 hours) at 04/24/2020 1659 Last data filed at 04/24/2020 0850 Gross per 24 hour  Intake 800 ml  Output 501 ml  Net 299 ml   Filed Weights   04/23/20 2000  Weight: 80.2 kg    Examination:  General: 79 y.o. male resting in bed in NAD Cardiovascular: RRR, +S1, S2, no m/g/r, equal pulses throughout Respiratory: CTABL, no w/r/r, normal WOB GI: BS hypoactive, NT, distended, no masses noted, no organomegaly noted MSK: No e/c/c Neuro: Alert to name, follows commands Psyc: Appropriate interaction but flat affect, calm/cooperative   Data Reviewed: I have personally reviewed following labs and imaging studies.  CBC: Recent Labs  Lab 04/22/20 1232 04/23/20 0516 04/24/20 0524  WBC 12.8* 11.2* 10.1  NEUTROABS  --   --  8.3*  HGB 9.6* 9.5* 9.3*  HCT 29.8* 29.0* 30.0*  MCV 90.3 89.2 92.3  PLT 342 313  [id number]   Basic Metabolic Panel: Recent Labs  Lab 04/22/20 1232 04/23/20 0516 04/24/20 0524  NA 133* 133* 136  K 4.1 3.6 3.5  CL 97* 99 103  CO2 24 22 21*  GLUCOSE 115* 102* 74  BUN 30* 28* 27*  CREATININE 1.49* 1.37* 1.29*  CALCIUM 8.8* 8.6* 8.4*  MG 2.2  --  2.0  PHOS  --   --  3.3   GFR: Estimated Creatinine Clearance: 47.9 mL/min (A) (by C-G formula based on SCr of 1.29 mg/dL (H)). Liver Function Tests: Recent  Labs  Lab 04/24/20 0524  ALBUMIN 2.7*   No results for input(s): LIPASE, AMYLASE in the last 168 hours. No results for input(s): AMMONIA in the last 168 hours. Coagulation Profile: Recent Labs  Lab 04/22/20 1232  INR 1.2   Cardiac Enzymes: No results for input(s): CKTOTAL, CKMB, CKMBINDEX, TROPONINI in the last 168 hours. BNP (last 3 results) No results for input(s): PROBNP in the last 8760 hours. HbA1C: No results for input(s): HGBA1C in the last 72 hours. CBG: No results for input(s): GLUCAP in the last 168 hours. Lipid Profile: No results for input(s): CHOL, HDL, LDLCALC, TRIG, CHOLHDL, LDLDIRECT in the last 72 hours. Thyroid Function Tests: Recent Labs    04/22/20 1232  TSH 1.020   Anemia Panel: No results for input(s): VITAMINB12, FOLATE, FERRITIN, TIBC, IRON, RETICCTPCT in the last 72 hours. Sepsis Labs: No results for input(s): PROCALCITON, LATICACIDVEN in the last 168 hours.  Recent Results (from the past 240 hour(s))  SARS Coronavirus 2 by RT PCR (hospital order, performed in Shawn Chambers hospital lab) Nasopharyngeal Nasopharyngeal Swab     Status: None   Collection Time: 04/23/20  2:57 PM   Specimen: Nasopharyngeal Swab  Result Value Ref Range Status   SARS Coronavirus 2 NEGATIVE NEGATIVE Final    Comment: (NOTE) SARS-CoV-2 target nucleic acids are NOT DETECTED. The SARS-CoV-2 RNA is generally detectable in upper and lower respiratory specimens during the acute phase of infection. The lowest concentration of SARS-CoV-2 viral copies this  assay can detect is 250 copies / mL. A negative result does not preclude SARS-CoV-2 infection and should not be used as the sole basis for treatment or other patient management decisions.  A negative result may occur with improper specimen collection / handling, submission of specimen other than nasopharyngeal swab, presence of viral mutation(s) within the areas targeted by this assay, and inadequate number of viral copies (<250 copies / mL). A negative result must be combined with clinical observations, patient history, and epidemiological information. Fact Sheet for Patients:   StrictlyIdeas.no Fact Sheet for Healthcare Providers: BankingDealers.co.za This test is not yet approved or cleared  by the [place] FDA and has been authorized for detection and/or diagnosis of SARS-CoV-2 by FDA under an Emergency Use Authorization (EUA).  This EUA will remain in effect (meaning this test can be used) for the duration of the COVID-19 declaration under Section 564(b)(1) of the Act, 21 U.S.C. section 360bbb-3(b)(1), unless the authorization is terminated or revoked sooner. Performed at [facility], [place] [address] [place], [place] [address]       Radiology Studies: CT ABDOMEN PELVIS WO CONTRAST  Result Date: 04/22/2020 CLINICAL DATA:  History of colon carcinoma and left renal mass with new abdominal distension. EXAM: CT ABDOMEN AND PELVIS WITHOUT CONTRAST TECHNIQUE: Multidetector CT imaging of the abdomen and pelvis was performed following the standard protocol without IV contrast. COMPARISON:  03/26/2020 FINDINGS: Lower chest: Scarring is noted in the bases bilaterally. No focal infiltrate or effusion is seen. Hepatobiliary: No focal liver abnormality is seen. No gallstones, gallbladder wall thickening, or biliary dilatation. Prior granulomatous disease in the liver is noted. Pancreas: Unremarkable. No pancreatic ductal  dilatation or surrounding inflammatory changes. Spleen: Normal in size without focal abnormality. Adrenals/Urinary Tract: Adrenal glands are within normal limits. The left kidney has been surgically removed. The right kidney now shows moderate hydronephrosis and hydroureter which extends inferiorly towards the urinary bladder. The bladder is decompressed but demonstrates significant posterior wall thickening. Stomach/Bowel: Hartmann's pouch is  noted distally with changes consistent with a left-sided colostomy. Hartmann's pouch again demonstrates wall thickening similar to that seen on the prior exam and also lies within the amorphous presacral soft tissue density seen previously. The colon is decompressed. There is significant dilatation of the small bowel from the proximal jejunum to the mid to distal ileum with a focal caliber change noted deep within the pelvis adjacent to previously mentioned amorphous soft tissue in the presacral region. This is best visualized on image number 75 of series 2. This represents progression of local disease with involvement of the mid to distal ileum. The distal most ileum is within normal limits just beyond this abnormality. Vascular/Lymphatic: Atherosclerotic calcifications are seen. Mild dilatation of the aorta is again noted and stable up to 4.1 cm. Some intra-aortocaval nodes are again seen and stable. No definitive pelvic sidewall adenopathy is noted. Reproductive: Prostate is enlarged which may contribute to the diffuse bladder wall thickening. Other: No ascites is noted. Fat containing inguinal hernias left greater than right are again noted. Musculoskeletal: Degenerative changes of the lumbar spine are seen. No definitive metastatic lesions are noted. IMPRESSION: Progression of the amorphous density in the presacral region consistent with localized recurrence of the patient's known colon carcinoma. This causes increased hydronephrosis of the right kidney as well as  significant small bowel dilatation extending from the proximal jejunum to the mid to distal ileum. The colon beyond the distal ileum is within normal limits. Dilatation of the abdominal aorta. Recommend followup by [place] in 1 year. This recommendation follows ACR consensus guidelines: White Paper of the ACR Incidental Findings Committee II on Vascular Findings. J Am Coll Radiol 2013; 10:789-794. Bladder wall thickening with associated enlarged prostate. This is stable from the prior exam. Status post left nephrectomy. Chronic changes as described above. Electronically Signed   By: Inez Catalina M.D.   On: 04/22/2020 19:13   DG Abd 1 View  Result Date: 04/24/2020 CLINICAL DATA:  Small bowel obstruction. EXAM: ABDOMEN - 1 VIEW COMPARISON:  Abdominal x-ray from yesterday. FINDINGS: Unchanged enteric tube. Multiple dilated loops of air-filled small bowel measuring up to 5.6 cm are unchanged. No radio-opaque calculi or other significant radiographic abnormality are seen. No acute osseous abnormality. IMPRESSION: Unchanged small bowel obstruction. Electronically Signed   By: Titus Dubin M.D.   On: 04/24/2020 07:15   IR GASTROSTOMY TUBE MOD SED  Result Date: 04/24/2020 INDICATION: Bowel obstruction, colon cancer, gastrostomy for palliative decompression EXAM: FLUOROSCOPIC [place] Date:  04/24/2020 04/24/2020 3:59 pm Radiologist:  Jerilynn Mages. Daryll Brod, MD Guidance:  FLUOROSCOPIC MEDICATIONS: ANCEF 2 G; Antibiotics were administered within 1 hour of the procedure. 1 MG GLUCAGON ANESTHESIA/SEDATION: Versed 3.0 mg IV; Fentanyl 100 mcg IV Moderate Sedation Time:  46 MINUTES The patient was continuously monitored during the procedure by the interventional radiology nurse under my direct supervision. CONTRAST:  54mL OMNIPAQUE IOHEXOL 300 MG/ML SOLN - administered into the gastric lumen. FLUOROSCOPY TIME:  Fluoroscopy Time: 20 minutes 54 seconds (661 mGy). COMPLICATIONS: None immediate. PROCEDURE:  Informed consent was obtained from the patient following explanation of the procedure, risks, benefits and alternatives. The patient understands, agrees and consents for the procedure. All questions were addressed. A time out was performed. Maximal barrier sterile technique utilized including caps, mask, sterile gowns, sterile gloves, large sterile drape, hand hygiene, and betadine prep. The left upper quadrant was sterilely prepped and draped. An oral gastric catheter was inserted into the stomach under fluoroscopy. The existing nasogastric feeding tube  was removed. Air was injected into the stomach for insufflation and visualization under fluoroscopy. The air distended stomach was confirmed beneath the anterior abdominal wall in the frontal and lateral projections. Under sterile conditions and local anesthesia, a 20 gauge trocar needle was utilized to access the stomach percutaneously beneath the left subcostal margin. Needle position was confirmed within the stomach under biplane fluoroscopy. Contrast injection confirmed position also. A single T tack was deployed for gastropexy. Over an Amplatz guide wire, a 9-French sheath was inserted into the stomach. Multiple attempts were made to snare the orogastric catheter from the percutaneous access. After approximately 20 minutes of fluoroscopy, this could not be performed. Therefore the gastrostomy was converted to a push in balloon retention gastrostomy. To do this, the 9 French sheath was exchanged for a compatible peel-away sheath. The 108 French balloon retention gastrostomy was advanced over the guidewire into the stomach through the peel-away sheath. Balloon was inflated with 7 cc saline containing 1 cc contrast. This was retracted from the stomach fundus to the gastropexy site anteriorly. Contrast injection confirms position in the stomach without leakage or extravasation. Images were obtained for documentation. The patient tolerated procedure well. No  immediate complication. IMPRESSION: Successful fluoroscopic 14 French balloon retention type gastrostomy. Confirmed in the stomach. Electronically Signed   By: Jerilynn Mages.  Shick M.D.   On: 04/24/2020 16:06   DG Abd Portable 1V-Small Bowel Obstruction Protocol-initial, 8 hr delay  Result Date: 04/23/2020 CLINICAL DATA:  Small bowel protocol postcontrast EXAM: PORTABLE ABDOMEN - 1 VIEW COMPARISON:  Apr 23, 2020 1:51 a.m. FINDINGS: NG tube tip is again noted within the proximal stomach. A small amount of residual contrast is seen likely within the stomach. There are mildly prominent air-filled loops of bowel within the mid abdomen measuring up to 5.4 cm. IMPRESSION: Again noted are multiple dilated loops of bowel, slightly more prominent than the prior exam. Electronically Signed   By: Prudencio Pair M.D.   On: 04/23/2020 03:11   DG Abd Portable 1V  Result Date: 04/23/2020 CLINICAL DATA:  NG tube placement EXAM: PORTABLE ABDOMEN - 1 VIEW COMPARISON:  04/22/2020 FINDINGS: Nasogastric tube tip and side port project over the stomach. There are multiple dilated loops of bowel throughout the abdomen. IMPRESSION: Nasogastric tube tip and side port projecting over the stomach. Electronically Signed   By: Ulyses Jarred M.D.   On: 04/23/2020 02:05     Scheduled Meds: . [START ON 04/25/2020] enoxaparin (LOVENOX) injection  40 mg Subcutaneous Q24H  . fentaNYL      . glucagon (human recombinant)      . latanoprost  1 drop Both Eyes QHS  . lidocaine      . lip balm  1 application Topical BID  . midazolam      . sodium chloride flush  3 mL Intravenous Q12H  . tamsulosin  0.8 mg Oral QHS   Continuous Infusions: . sodium chloride 100 mL/hr at 04/24/20 1147  . lactated ringers    . methocarbamol (ROBAXIN) IV    . ondansetron (ZOFRAN) IV       LOS: 2 days    Time spent: 25 minutes spent in the coordination of care today.    Jonnie Finner, DO [facility]  If 7PM-7AM, please contact  night-coverage [email or web address] 04/24/2020, 4:59 PM

## 2020-04-25 ENCOUNTER — Telehealth: Payer: Self-pay | Admitting: Oncology

## 2020-04-25 DIAGNOSIS — D638 Anemia in other chronic diseases classified elsewhere: Secondary | ICD-10-CM

## 2020-04-25 DIAGNOSIS — R109 Unspecified abdominal pain: Secondary | ICD-10-CM

## 2020-04-25 DIAGNOSIS — H9193 Unspecified hearing loss, bilateral: Secondary | ICD-10-CM

## 2020-04-25 DIAGNOSIS — C775 Secondary and unspecified malignant neoplasm of intrapelvic lymph nodes: Secondary | ICD-10-CM

## 2020-04-25 DIAGNOSIS — C20 Malignant neoplasm of rectum: Secondary | ICD-10-CM

## 2020-04-25 DIAGNOSIS — K5909 Other constipation: Secondary | ICD-10-CM

## 2020-04-25 NOTE — Care Management Important Message (Signed)
Important Message  Patient Details IM Letter given to Marney Doctor RN Case Manager to present to the Patient Name: Shawn Chambers MRN: [id number] Date of Birth: 1941/08/30   Medicare Important Message Given:  Yes     [name] 04/25/2020, 10:04 AM

## 2020-04-25 NOTE — Progress Notes (Signed)
[facility] to pt's daughter-- [name] last evening. She feels there isn't any  equipment needs at this time. Discussed with her hospice care at home. She reports they are interested in the services once he d/c home. Will check with SW this am to see if she thinks any recommendations for equipment in home.   He has been approved for hospice care at home by our provider and we are prepared to visit and follow once he gets home.   Webb Silversmith RN [phone]

## 2020-04-25 NOTE — Progress Notes (Signed)
Pt discharged home with son in stable condition. Discharge instructions provided along with G tube care. No immediate questions or concerns at this time. Discharged from unit via wheelchair.

## 2020-04-25 NOTE — Discharge Summary (Signed)
. Physician Discharge Summary  Shawn Chambers [id number] DOB: [birth year] DOA: 04/22/2020  PCP: [facility]  Admit date: 04/22/2020 Discharge date: 04/25/2020  Admitted From: Home Disposition:  Home with hospice  Recommendations for Outpatient Follow-up:  1. Follow up with PCP in 1-2 weeks 2. Follow up with hospice.  Discharge Condition: Stable  CODE STATUS: DNR   Brief/Interim Summary: Shawn Hedgecockis a 79 y.o.malewith medical history significant ofBPH, rectal cancer status post colostomy, GERD, significant hearing deficit and seizure who initially went to see his oncologist Dr. Ronny Bacon regular follow-up visit where he mentioned having abdominal pain and having no output from his colostomy bag and he ended up having abdominal x-ray which showed possible small or large bowel obstruction. Hospital service was then called by Dr. Charlene Brooke requested admission and consultation with general surgery.  Patient seen and examined in the room. He is very hard of hearing. Nurse at the bedside. Patient did endorse having abdominal pain intermittently for last 1 week to the nurse but he denied any pain to me. According to my discussion with Dr. Stephens Chambers did not have any output from his ostomy for last 2 days however patient tells me that he did have output and in fact he emptied the bag this morning before going to the oncology visit. He denies any nausea, vomiting or any other complaint however he endorses having decreased appetite. Per my discussion with his oncologist, patient has declined further chemotherapy and in fact was open to have hospice on board but when I tried to talk to him, he was not sure about that.He was also having hard time understanding the concept of CODE STATUS and could not really respond/choose between DNR and full code. I then talked to patient's son [name] who deferred me to patient's daughter [name] is the oldest child. I spoke  to [facility] she expressed her wishes to keep the patient as full code for now. When discussed about hospice, she was not sure about that however she was agreeable to having palliative care consult.  5/13: No surgical intervention planned. Patient will have venting PEG placed and go home with hospice. Awaiting final confirmation that everything is set up. He is stable otherwise.  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 This causes increased hydronephrosis of the right kidney as well as significant small bowel dilatation extending from the proximal jejunum to the mid to distal ileum. The colon beyond the distal ileum is within normal limits. - NGT is in place and set to [facility]. Stool is noted in collection jug. - Awaiting final surgical recommendations - Spoke with Onco (Dr. Benay Spice); he has previous spoken with dtr about hospice. He will talk with them again. He recs a couple days of fluids and NG and follow up surgical recs.     -  5/13: to have venting PEG placed today. He will go home with hospice. CM working on arrangements.     - 5/14: Home hospice arranged. He is ok for discharge.   BPH - Remains NPO; however, his flomax was resumed  CKD  stage IIIa - SCr is at baseline; continue to monitor  Right hydronephrosis - as per imaging above; follow I&Os and SCr; progress will be dependent on what we do with the mass  Clinical stage III rectal cancer with diverting colostomy - Has output in the colostomy bag.  - Further management per oncology and general surgery.     - 5/13: to have venting PEG placed today. He will go home with hospice.     - 5/14: PEG placed. Home hospice arranged. He is ok for discharge.   Normocytic anemia - no evidenc of bleed, monitor     - 5/13: stable, monitor  Mild hyponatremia - monitor Na+     - 5/13: resolved  Discharge Instructions   Allergies as of 04/25/2020   No Known Allergies     Medication List    STOP taking these medications   capecitabine 500 MG tablet Commonly known as: XELODA   prochlorperazine 10 MG tablet Commonly known as: COMPAZINE   traMADol 50 MG tablet Commonly known as: ULTRAM     TAKE these medications   bisacodyl 5 MG EC tablet Commonly known as: DULCOLAX Take 5 mg by mouth daily as needed for moderate constipation.   docusate sodium 100 MG capsule Commonly known as: COLACE Take 100 mg by mouth 2 (two) times daily as needed for mild constipation.   latanoprost 0.005 % ophthalmic solution Commonly known as: XALATAN Place 1 drop into both eyes at bedtime.   magnesium citrate Soln Take 0.5 Bottles by mouth as needed for severe constipation.   MIRALAX PO Take 17 g by mouth daily as needed (constipation).   tamsulosin 0.4 MG Caps capsule Commonly known as: FLOMAX Take 0.8 mg by mouth at bedtime.       No Known Allergies  Consultations:  General Surgery  Heme/Onc   Procedures/Studies: CT ABDOMEN PELVIS WO CONTRAST  Result Date: 04/22/2020 CLINICAL DATA:  History of colon carcinoma and left renal mass with new abdominal distension. EXAM: CT ABDOMEN AND PELVIS WITHOUT CONTRAST TECHNIQUE: Multidetector CT  imaging of the abdomen and pelvis was performed following the standard protocol without IV contrast. COMPARISON:  03/26/2020 FINDINGS: Lower chest: Scarring is noted in the bases bilaterally. No focal infiltrate or effusion is seen. Hepatobiliary: No focal liver abnormality is seen. No gallstones, gallbladder wall thickening, or biliary dilatation. Prior granulomatous disease in the liver is noted. Pancreas: Unremarkable. No pancreatic ductal dilatation or surrounding inflammatory changes. Spleen: Normal in size without focal abnormality. Adrenals/Urinary Tract: Adrenal glands are within normal limits. The left kidney has been surgically removed. The right kidney now shows moderate hydronephrosis and hydroureter which extends inferiorly towards the urinary bladder. The bladder is decompressed but demonstrates significant posterior wall thickening. Stomach/Bowel: Hartmann's pouch is noted distally with changes consistent with a left-sided colostomy. Hartmann's pouch again demonstrates wall thickening similar to that seen on the prior exam and also lies within the amorphous presacral soft tissue density seen previously. The colon is decompressed. There is significant dilatation of the small bowel from the proximal jejunum to the mid to distal ileum with a focal caliber change noted deep within the pelvis adjacent to previously mentioned amorphous soft tissue in the presacral region. This is best visualized  on image number 75 of series 2. This represents progression of local disease with involvement of the mid to distal ileum. The distal most ileum is within normal limits just beyond this abnormality. Vascular/Lymphatic: Atherosclerotic calcifications are seen. Mild dilatation of the aorta is again noted and stable up to 4.1 cm. Some intra-aortocaval nodes are again seen and stable. No definitive pelvic sidewall adenopathy is noted. Reproductive: Prostate is enlarged which may contribute to the diffuse bladder wall  thickening. Other: No ascites is noted. Fat containing inguinal hernias left greater than right are again noted. Musculoskeletal: Degenerative changes of the lumbar spine are seen. No definitive metastatic lesions are noted. IMPRESSION: Progression of the amorphous density in the presacral region consistent with localized recurrence of the patient's known colon carcinoma. This causes increased hydronephrosis of the right kidney as well as significant small bowel dilatation extending from the proximal jejunum to the mid to distal ileum. The colon beyond the distal ileum is within normal limits. Dilatation of the abdominal aorta. Recommend followup by [place] in 1 year. This recommendation follows ACR consensus guidelines: White Paper of the ACR Incidental Findings Committee II on Vascular Findings. J Am Coll Radiol 2013; 10:789-794. Bladder wall thickening with associated enlarged prostate. This is stable from the prior exam. Status post left nephrectomy. Chronic changes as described above. Electronically Signed   By: Inez Catalina M.D.   On: 04/22/2020 19:13   DG Abd 1 View  Result Date: 04/24/2020 CLINICAL DATA:  Small bowel obstruction. EXAM: ABDOMEN - 1 VIEW COMPARISON:  Abdominal x-ray from yesterday. FINDINGS: Unchanged enteric tube. Multiple dilated loops of air-filled small bowel measuring up to 5.6 cm are unchanged. No radio-opaque calculi or other significant radiographic abnormality are seen. No acute osseous abnormality. IMPRESSION: Unchanged small bowel obstruction. Electronically Signed   By: Titus Dubin M.D.   On: 04/24/2020 07:15   CT CHEST W CONTRAST  Result Date: 03/26/2020 CLINICAL DATA:  History of rectal and renal carcinoma. Chemotherapy for rectal cancer. Occasional abdominopelvic pain and blood in stool. Restaging of rectal cancer with rising CEA. Status post low anterior resection of rectal cancer on 10/18/2019. EXAM: CT CHEST, ABDOMEN, AND PELVIS WITH CONTRAST TECHNIQUE: Multidetector  CT imaging of the chest, abdomen and pelvis was performed following the standard protocol during bolus administration of intravenous contrast. CONTRAST:  148mL OMNIPAQUE IOHEXOL 300 MG/ML  SOLN COMPARISON:  08/17/2019 rectal MRI.  01/23/2018 CTs. FINDINGS: CT CHEST FINDINGS Cardiovascular: Aortic atherosclerosis. Upper normal ascending aortic size, including at 3.9 cm. Tortuous thoracic aorta. Mild cardiomegaly, without pericardial effusion. Multivessel coronary artery atherosclerosis. No central pulmonary embolism, on this non-dedicated study. Pulmonary artery enlargement, outflow tract 3.2 cm Mediastinum/Nodes: No supraclavicular adenopathy. No mediastinal or hilar adenopathy. Lungs/Pleura: No pleural fluid.  Clear lungs. Musculoskeletal: No acute osseous abnormality. CT ABDOMEN PELVIS FINDINGS Hepatobiliary: Old granulomatous disease in the liver. Normal gallbladder, without biliary ductal dilatation. Pancreas: Normal, without mass or ductal dilatation. Spleen: Normal in size, without focal abnormality. Adrenals/Urinary Tract: Normal adrenal glands. Left nephrectomy. Right renal too small to characterize lesions. No hydronephrosis. The bladder is moderately thick walled with surrounding edema. Stomach/Bowel: Normal stomach, without wall thickening. Left abdominal distal transverse colostomy. The remaining rectum is moderately thick walled, including on 108/2. At the proximal most portion of the rectum, there is infiltrative soft tissue density including at 3.6 x 5.6 cm on 103/2. This is intimately associated with the seminal vesicles, including on 106/2. Contiguous, more amorphous presacral soft tissue including on 98/2 is new since  the prior CT. Small bowel loops are normal in caliber. Small bowel contacts the presacral soft tissue thickening, including on 101/2. Vascular/Lymphatic: Aortic atherosclerosis. Infrarenal aortic ectasia at 3.9 x 3.5 cm is similar. No extension into the iliacs. Aortocaval node  measures 8 mm on 70/2 versus 2 mm on the prior CT. More cephalad aortocaval node of 8 mm on 67/2 has also enlarged from approximately 2 mm on the prior. No pelvic sidewall adenopathy. Reproductive: Mild prostatomegaly. Seminal vesicle contact with suspicious soft tissue density in the pelvis. Median lobe prostate impression into the urinary bladder. Other: Small fat containing left inguinal hernia. No evidence of omental or peritoneal disease. Musculoskeletal: Left acetabular sub subcentimeter sclerotic lesion is likely a bone island and is unchanged. IMPRESSION: 1. Interval low anterior resection of rectal cancer. Wall thickening within the remaining rectum with soft tissue mass at the level of the superior most rectum. Highly suspicious for locally advanced, residual/recurrent disease. Infectious etiology felt less likely. More amorphous presacral soft tissue thickening could be treatment related, but is nonspecific. Consider PET to direct sampling. 2. Developing retroperitoneal adenopathy, highly suspicious for nodal metastasis. 3. Bladder wall thickening and pericystic edema, suspicious for cystitis. Correlate with pelvic radiation therapy. 4.  No acute process or evidence of metastatic disease in the chest. 5. Aortic atherosclerosis (ICD10-I70.0) and emphysema (ICD10-J43.9). 6. Pulmonary artery enlargement suggests pulmonary arterial hypertension. Electronically Signed   By: Abigail Miyamoto M.D.   On: 03/26/2020 12:31   CT ABDOMEN PELVIS W CONTRAST  Result Date: 03/26/2020 CLINICAL DATA:  History of rectal and renal carcinoma. Chemotherapy for rectal cancer. Occasional abdominopelvic pain and blood in stool. Restaging of rectal cancer with rising CEA. Status post low anterior resection of rectal cancer on 10/18/2019. EXAM: CT CHEST, ABDOMEN, AND PELVIS WITH CONTRAST TECHNIQUE: Multidetector CT imaging of the chest, abdomen and pelvis was performed following the standard protocol during bolus administration of  intravenous contrast. CONTRAST:  149mL OMNIPAQUE IOHEXOL 300 MG/ML  SOLN COMPARISON:  08/17/2019 rectal MRI.  01/23/2018 CTs. FINDINGS: CT CHEST FINDINGS Cardiovascular: Aortic atherosclerosis. Upper normal ascending aortic size, including at 3.9 cm. Tortuous thoracic aorta. Mild cardiomegaly, without pericardial effusion. Multivessel coronary artery atherosclerosis. No central pulmonary embolism, on this non-dedicated study. Pulmonary artery enlargement, outflow tract 3.2 cm Mediastinum/Nodes: No supraclavicular adenopathy. No mediastinal or hilar adenopathy. Lungs/Pleura: No pleural fluid.  Clear lungs. Musculoskeletal: No acute osseous abnormality. CT ABDOMEN PELVIS FINDINGS Hepatobiliary: Old granulomatous disease in the liver. Normal gallbladder, without biliary ductal dilatation. Pancreas: Normal, without mass or ductal dilatation. Spleen: Normal in size, without focal abnormality. Adrenals/Urinary Tract: Normal adrenal glands. Left nephrectomy. Right renal too small to characterize lesions. No hydronephrosis. The bladder is moderately thick walled with surrounding edema. Stomach/Bowel: Normal stomach, without wall thickening. Left abdominal distal transverse colostomy. The remaining rectum is moderately thick walled, including on 108/2. At the proximal most portion of the rectum, there is infiltrative soft tissue density including at 3.6 x 5.6 cm on 103/2. This is intimately associated with the seminal vesicles, including on 106/2. Contiguous, more amorphous presacral soft tissue including on 98/2 is new since the prior CT. Small bowel loops are normal in caliber. Small bowel contacts the presacral soft tissue thickening, including on 101/2. Vascular/Lymphatic: Aortic atherosclerosis. Infrarenal aortic ectasia at 3.9 x 3.5 cm is similar. No extension into the iliacs. Aortocaval node measures 8 mm on 70/2 versus 2 mm on the prior CT. More cephalad aortocaval node of 8 mm on 67/2 has also enlarged from  approximately 2 mm on the prior. No pelvic sidewall adenopathy. Reproductive: Mild prostatomegaly. Seminal vesicle contact with suspicious soft tissue density in the pelvis. Median lobe prostate impression into the urinary bladder. Other: Small fat containing left inguinal hernia. No evidence of omental or peritoneal disease. Musculoskeletal: Left acetabular sub subcentimeter sclerotic lesion is likely a bone island and is unchanged. IMPRESSION: 1. Interval low anterior resection of rectal cancer. Wall thickening within the remaining rectum with soft tissue mass at the level of the superior most rectum. Highly suspicious for locally advanced, residual/recurrent disease. Infectious etiology felt less likely. More amorphous presacral soft tissue thickening could be treatment related, but is nonspecific. Consider PET to direct sampling. 2. Developing retroperitoneal adenopathy, highly suspicious for nodal metastasis. 3. Bladder wall thickening and pericystic edema, suspicious for cystitis. Correlate with pelvic radiation therapy. 4.  No acute process or evidence of metastatic disease in the chest. 5. Aortic atherosclerosis (ICD10-I70.0) and emphysema (ICD10-J43.9). 6. Pulmonary artery enlargement suggests pulmonary arterial hypertension. Electronically Signed   By: Abigail Miyamoto M.D.   On: 03/26/2020 12:31   IR GASTROSTOMY TUBE MOD SED  Result Date: 04/24/2020 INDICATION: Bowel obstruction, colon cancer, gastrostomy for palliative decompression EXAM: FLUOROSCOPIC [place] Date:  04/24/2020 04/24/2020 3:59 pm Radiologist:  Jerilynn Mages. Daryll Brod, MD Guidance:  FLUOROSCOPIC MEDICATIONS: ANCEF 2 G; Antibiotics were administered within 1 hour of the procedure. 1 MG GLUCAGON ANESTHESIA/SEDATION: Versed 3.0 mg IV; Fentanyl 100 mcg IV Moderate Sedation Time:  55 MINUTES The patient was continuously monitored during the procedure by the interventional radiology nurse under my direct supervision.  CONTRAST:  79mL OMNIPAQUE IOHEXOL 300 MG/ML SOLN - administered into the gastric lumen. FLUOROSCOPY TIME:  Fluoroscopy Time: 20 minutes 54 seconds (661 mGy). COMPLICATIONS: None immediate. PROCEDURE: Informed consent was obtained from the patient following explanation of the procedure, risks, benefits and alternatives. The patient understands, agrees and consents for the procedure. All questions were addressed. A time out was performed. Maximal barrier sterile technique utilized including caps, mask, sterile gowns, sterile gloves, large sterile drape, hand hygiene, and betadine prep. The left upper quadrant was sterilely prepped and draped. An oral gastric catheter was inserted into the stomach under fluoroscopy. The existing nasogastric feeding tube was removed. Air was injected into the stomach for insufflation and visualization under fluoroscopy. The air distended stomach was confirmed beneath the anterior abdominal wall in the frontal and lateral projections. Under sterile conditions and local anesthesia, a 62 gauge trocar needle was utilized to access the stomach percutaneously beneath the left subcostal margin. Needle position was confirmed within the stomach under biplane fluoroscopy. Contrast injection confirmed position also. A single T tack was deployed for gastropexy. Over an Amplatz guide wire, a 9-French sheath was inserted into the stomach. Multiple attempts were made to snare the orogastric catheter from the percutaneous access. After approximately 20 minutes of fluoroscopy, this could not be performed. Therefore the gastrostomy was converted to a push in balloon retention gastrostomy. To do this, the 9 French sheath was exchanged for a compatible peel-away sheath. The 49 French balloon retention gastrostomy was advanced over the guidewire into the stomach through the peel-away sheath. Balloon was inflated with 7 cc saline containing 1 cc contrast. This was retracted from the stomach fundus to the  gastropexy site anteriorly. Contrast injection confirms position in the stomach without leakage or extravasation. Images were obtained for documentation. The patient tolerated procedure well. No immediate complication. IMPRESSION: Successful fluoroscopic 14 French balloon retention type gastrostomy. Confirmed in  the stomach. Electronically Signed   By: Jerilynn Mages.  Shick M.D.   On: 04/24/2020 16:06   DG Abd 2 Views  Result Date: 04/22/2020 CLINICAL DATA:  Colorectal cancer, increased abdominal distension and pain EXAM: ABDOMEN - 2 VIEW COMPARISON:  11/28/2018 Correlation: CT abdomen and pelvis 03/26/2020 FINDINGS: Air-filled dilated loops of bowel are seen throughout the abdomen into the upper pelvis. These appear to represent a combination of large and small bowel loops. Absent colonic gas. Air-fluid levels on upright view. LEFT mid abdomen ostomy not well visualized. Mild bowel wall thickening of a single loop of bowel in mid abdomen, question transverse colon. No free air. Gaseous distention of sigmoid colon. Bones demineralized with scattered degenerative disc and facet disease changes. Prostatic calcifications seen on RIGHT. IMPRESSION: Dilated air-filled loops of bowel throughout abdomen which appear to represent both large and small bowel, question ileus versus obstruction. Unable to exclude distal colonic obstruction at or near the colostomy in the LEFT mid abdomen Mild bowel wall thickening of transverse colon. Consider CT imaging with IV and oral contrast for further assessment. Electronically Signed   By: Lavonia Dana M.D.   On: 04/22/2020 10:58   DG Abd Portable 1V-Small Bowel Obstruction Protocol-initial, 8 hr delay  Result Date: 04/23/2020 CLINICAL DATA:  Small bowel protocol postcontrast EXAM: PORTABLE ABDOMEN - 1 VIEW COMPARISON:  Apr 23, 2020 1:51 a.m. FINDINGS: NG tube tip is again noted within the proximal stomach. A small amount of residual contrast is seen likely within the stomach. There are  mildly prominent air-filled loops of bowel within the mid abdomen measuring up to 5.4 cm. IMPRESSION: Again noted are multiple dilated loops of bowel, slightly more prominent than the prior exam. Electronically Signed   By: Prudencio Pair M.D.   On: 04/23/2020 03:11   DG Abd Portable 1V  Result Date: 04/23/2020 CLINICAL DATA:  NG tube placement EXAM: PORTABLE ABDOMEN - 1 VIEW COMPARISON:  04/22/2020 FINDINGS: Nasogastric tube tip and side port project over the stomach. There are multiple dilated loops of bowel throughout the abdomen. IMPRESSION: Nasogastric tube tip and side port projecting over the stomach. Electronically Signed   By: Ulyses Jarred M.D.   On: 04/23/2020 02:05      Subjective: "I'm good. Just waiting for them to process my papers."  Discharge Exam: Vitals:   04/24/20 2151 04/25/20 0603  BP: 132/69 (!) 106/54  Pulse: 70 62  Resp: 20 16  Temp: 99.6 F (37.6 C) 98.5 F (36.9 C)  SpO2: 94% 96%   Vitals:   04/24/20 1711 04/24/20 1741 04/24/20 2151 04/25/20 0603  BP: 123/60 (!) 130/58 132/69 (!) 106/54  Pulse: 60 63 70 62  Resp: 16 16 20 16   Temp: 97.7 F (36.5 C) (!) 97.4 F (36.3 C) 99.6 F (37.6 C) 98.5 F (36.9 C)  TempSrc: Oral Oral Oral Oral  SpO2: 95% 96% 94% 96%  Weight:      Height:        General: 79 y.o. male resting in chair in NAD Cardiovascular: RRR, +S1, S2, no m/g/r, equal pulses throughout Respiratory: CTABL, no w/r/r, normal WOB GI: BS+, NDNT, ostomy noted, PEG noted MSK: No e/c/c Neuro: Alert to name, follows commands Psyc: Appropriate interaction and affect, calm/cooperative   The results of significant diagnostics from this hospitalization (including imaging, microbiology, ancillary and laboratory) are listed below for reference.     Microbiology: Recent Results (from the past 240 hour(s))  SARS Coronavirus 2 by RT PCR (hospital order, performed in  [facility] hospital lab) Nasopharyngeal Nasopharyngeal Swab     Status: None    Collection Time: 04/23/20  2:57 PM   Specimen: Nasopharyngeal Swab  Result Value Ref Range Status   SARS Coronavirus 2 NEGATIVE NEGATIVE Final    Comment: (NOTE) SARS-CoV-2 target nucleic acids are NOT DETECTED. The SARS-CoV-2 RNA is generally detectable in upper and lower respiratory specimens during the acute phase of infection. The lowest concentration of SARS-CoV-2 viral copies this assay can detect is 250 copies / mL. A negative result does not preclude SARS-CoV-2 infection and should not be used as the sole basis for treatment or other patient management decisions.  A negative result may occur with improper specimen collection / handling, submission of specimen other than nasopharyngeal swab, presence of viral mutation(s) within the areas targeted by this assay, and inadequate number of viral copies (<250 copies / mL). A negative result must be combined with clinical observations, patient history, and epidemiological information. Fact Sheet for Patients:   StrictlyIdeas.no Fact Sheet for Healthcare Providers: BankingDealers.co.za This test is not yet approved or cleared  by the [place] FDA and has been authorized for detection and/or diagnosis of SARS-CoV-2 by FDA under an Emergency Use Authorization (EUA).  This EUA will remain in effect (meaning this test can be used) for the duration of the COVID-19 declaration under Section 564(b)(1) of the Act, 21 U.S.C. section 360bbb-3(b)(1), unless the authorization is terminated or revoked sooner. Performed at [facility], [place] [address] [place],  [address]      Labs: BNP (last 3 results) No results for input(s): BNP in the last 8760 hours. Basic Metabolic Panel: Recent Labs  Lab 04/22/20 1232 04/23/20 0516 04/24/20 0524  NA 133* 133* 136  K 4.1 3.6 3.5  CL 97* 99 103  CO2 24 22 21*  GLUCOSE 115* 102* 74  BUN 30* 28* 27*  CREATININE 1.49*  1.37* 1.29*  CALCIUM 8.8* 8.6* 8.4*  MG 2.2  --  2.0  PHOS  --   --  3.3   Liver Function Tests: Recent Labs  Lab 04/24/20 0524  ALBUMIN 2.7*   No results for input(s): LIPASE, AMYLASE in the last 168 hours. No results for input(s): AMMONIA in the last 168 hours. CBC: Recent Labs  Lab 04/22/20 1232 04/23/20 0516 04/24/20 0524  WBC 12.8* 11.2* 10.1  NEUTROABS  --   --  8.3*  HGB 9.6* 9.5* 9.3*  HCT 29.8* 29.0* 30.0*  MCV 90.3 89.2 92.3  PLT 342 313 347   Cardiac Enzymes: No results for input(s): CKTOTAL, CKMB, CKMBINDEX, TROPONINI in the last 168 hours. BNP: Invalid input(s): POCBNP CBG: No results for input(s): GLUCAP in the last 168 hours. D-Dimer No results for input(s): DDIMER in the last 72 hours. Hgb A1c No results for input(s): HGBA1C in the last 72 hours. Lipid Profile No results for input(s): CHOL, HDL, LDLCALC, TRIG, CHOLHDL, LDLDIRECT in the last 72 hours. Thyroid function studies Recent Labs    04/22/20 1232  TSH 1.020   Anemia work up No results for input(s): VITAMINB12, FOLATE, FERRITIN, TIBC, IRON, RETICCTPCT in the last 72 hours. Urinalysis    Component Value Date/Time   COLORURINE STRAW (A) 11/22/2019 0919   APPEARANCEUR CLEAR 11/22/2019 0919   LABSPEC 1.008 11/22/2019 0919   PHURINE 6.0 11/22/2019 0919   GLUCOSEU NEGATIVE 11/22/2019 0919   HGBUR NEGATIVE 11/22/2019 0919   BILIRUBINUR NEGATIVE 11/22/2019 0919   KETONESUR NEGATIVE 11/22/2019 0919   PROTEINUR NEGATIVE 11/22/2019 0919  NITRITE NEGATIVE 11/22/2019 0919   LEUKOCYTESUR NEGATIVE 11/22/2019 0919   Sepsis Labs Invalid input(s): PROCALCITONIN,  WBC,  LACTICIDVEN Microbiology Recent Results (from the past 240 hour(s))  SARS Coronavirus 2 by RT PCR (hospital order, performed in [facility] hospital lab) Nasopharyngeal Nasopharyngeal Swab     Status: None   Collection Time: 04/23/20  2:57 PM   Specimen: Nasopharyngeal Swab  Result Value Ref Range Status   SARS Coronavirus 2  NEGATIVE NEGATIVE Final    Comment: (NOTE) SARS-CoV-2 target nucleic acids are NOT DETECTED. The SARS-CoV-2 RNA is generally detectable in upper and lower respiratory specimens during the acute phase of infection. The lowest concentration of SARS-CoV-2 viral copies this assay can detect is 250 copies / mL. A negative result does not preclude SARS-CoV-2 infection and should not be used as the sole basis for treatment or other patient management decisions.  A negative result may occur with improper specimen collection / handling, submission of specimen other than nasopharyngeal swab, presence of viral mutation(s) within the areas targeted by this assay, and inadequate number of viral copies (<250 copies / mL). A negative result must be combined with clinical observations, patient history, and epidemiological information. Fact Sheet for Patients:   StrictlyIdeas.no Fact Sheet for Healthcare Providers: BankingDealers.co.za This test is not yet approved or cleared  by the [place] FDA and has been authorized for detection and/or diagnosis of SARS-CoV-2 by FDA under an Emergency Use Authorization (EUA).  This EUA will remain in effect (meaning this test can be used) for the duration of the COVID-19 declaration under Section 564(b)(1) of the Act, 21 U.S.C. section 360bbb-3(b)(1), unless the authorization is terminated or revoked sooner. Performed at [facility], [place] [address] [place], [place] [address]      Time coordinating discharge: 35 minutes  SIGNED:   Jonnie Finner, DO  [facility] 04/25/2020, 8:02 AM   If 7PM-7AM, please contact night-coverage [email or web address]

## 2020-04-25 NOTE — Progress Notes (Signed)
IP PROGRESS NOTE  Subjective:   NG tube is in removed.  He underwent palliative G-tube placement yesterday by IR.  Denies pain to G-tube site.  Denies abdominal pain,, nausea, vomiting.  Ostomy is functioning without any difficulty.  Objective: Vital signs in last 24 hours: Blood pressure (!) 106/54, pulse 62, temperature 98.5 F (36.9 C), temperature source Oral, resp. rate 16, height '5\' 10"'  (1.778 m), weight 176 lb 14.4 oz (80.2 kg), SpO2 96 %.  Intake/Output from previous day: No intake/output data recorded.  Physical Exam:  General: Awake and alert, no distress, sitting up in the recliner. HEENT: No thrush or mucositis Abdomen: Distended, left lower quadrant colostomy with small amount of soft tan stool, G-tube without erythema  Lab Results: Recent Labs    04/23/20 0516 04/24/20 0524  WBC 11.2* 10.1  HGB 9.5* 9.3*  HCT 29.0* 30.0*  PLT 313 347    BMET Recent Labs    04/23/20 0516 04/24/20 0524  NA 133* 136  K 3.6 3.5  CL 99 103  CO2 22 21*  GLUCOSE 102* 74  BUN 28* 27*  CREATININE 1.37* 1.29*  CALCIUM 8.6* 8.4*    Lab Results  Component Value Date   CEA1 52.52 (H) 03/20/2020    Studies/Results: DG Abd 1 View  Result Date: 04/24/2020 CLINICAL DATA:  Small bowel obstruction. EXAM: ABDOMEN - 1 VIEW COMPARISON:  Abdominal x-ray from yesterday. FINDINGS: Unchanged enteric tube. Multiple dilated loops of air-filled small bowel measuring up to 5.6 cm are unchanged. No radio-opaque calculi or other significant radiographic abnormality are seen. No acute osseous abnormality. IMPRESSION: Unchanged small bowel obstruction. Electronically Signed   By: Titus Dubin M.D.   On: 04/24/2020 07:15   IR GASTROSTOMY TUBE MOD SED  Result Date: 04/24/2020 INDICATION: Bowel obstruction, colon cancer, gastrostomy for palliative decompression EXAM: FLUOROSCOPIC [place] Date:  04/24/2020 04/24/2020 3:59 pm Radiologist:  Jerilynn Mages. Daryll Brod, MD  Guidance:  FLUOROSCOPIC MEDICATIONS: ANCEF 2 G; Antibiotics were administered within 1 hour of the procedure. 1 MG GLUCAGON ANESTHESIA/SEDATION: Versed 3.0 mg IV; Fentanyl 100 mcg IV Moderate Sedation Time:  64 MINUTES The patient was continuously monitored during the procedure by the interventional radiology nurse under my direct supervision. CONTRAST:  39m OMNIPAQUE IOHEXOL 300 MG/ML SOLN - administered into the gastric lumen. FLUOROSCOPY TIME:  Fluoroscopy Time: 20 minutes 54 seconds (661 mGy). COMPLICATIONS: None immediate. PROCEDURE: Informed consent was obtained from the patient following explanation of the procedure, risks, benefits and alternatives. The patient understands, agrees and consents for the procedure. All questions were addressed. A time out was performed. Maximal barrier sterile technique utilized including caps, mask, sterile gowns, sterile gloves, large sterile drape, hand hygiene, and betadine prep. The left upper quadrant was sterilely prepped and draped. An oral gastric catheter was inserted into the stomach under fluoroscopy. The existing nasogastric feeding tube was removed. Air was injected into the stomach for insufflation and visualization under fluoroscopy. The air distended stomach was confirmed beneath the anterior abdominal wall in the frontal and lateral projections. Under sterile conditions and local anesthesia, a 165gauge trocar needle was utilized to access the stomach percutaneously beneath the left subcostal margin. Needle position was confirmed within the stomach under biplane fluoroscopy. Contrast injection confirmed position also. A single T tack was deployed for gastropexy. Over an Amplatz guide wire, a 9-French sheath was inserted into the stomach. Multiple attempts were made to snare the orogastric catheter from the percutaneous access. After approximately 20 minutes of  fluoroscopy, this could not be performed. Therefore the gastrostomy was converted to a push in  balloon retention gastrostomy. To do this, the 9 French sheath was exchanged for a compatible peel-away sheath. The 44 French balloon retention gastrostomy was advanced over the guidewire into the stomach through the peel-away sheath. Balloon was inflated with 7 cc saline containing 1 cc contrast. This was retracted from the stomach fundus to the gastropexy site anteriorly. Contrast injection confirms position in the stomach without leakage or extravasation. Images were obtained for documentation. The patient tolerated procedure well. No immediate complication. IMPRESSION: Successful fluoroscopic 14 French balloon retention type gastrostomy. Confirmed in the stomach. Electronically Signed   By: Jerilynn Mages.  Shick M.D.   On: 04/24/2020 16:06    Medications: I have reviewed the patient's current medications.  1. AssesColon cancer, descending colon, stage II (T3 N0), status post a partial left colectomy 03/28/2017 ? MSI-stable, no loss of mismatch repair protein expression ? CT abdomen/pelvis 03/26/2017 and CT chest 03/27/2017-no evidence metastatic disease, abdomen/pelvis CT-long segment of bowel wall thickening in the left colon, partial mechanical obstruction by the area of thickening in the left colon, solid enhancing left renal lesion ? Elevated preoperative CEA ? CT 11/15/2018-irregular rectal wall thickening/perirectal stranding, polypoid defect in the mid transverse colon proximal to the colostomy-indeterminate, nodularity of the prostate with a hyperdense component in the median lobe-stable ? MRI pelvis 02/01/2019-T3BN1 rectal tumor at 4.5 cm from the internal anal sphincter, 11.5 cm in length ? Colonoscopy 02/22/2019-malignant completely obstructing tumor in the proximal rectum. Polyps in the mid and distal rectum; polyps in the ascending, transverse/distal transverse colon. 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 Rectum biopsy shows high-grade dysplasia with focal features  suspicious for adenocarcinoma. ? Radiation/Xeloda 03/06/2019-04/12/2019 ? MRI pelvis 08/17/2019-4.8 cm apple core lesion at the rectosigmoid junction, T3B N0; distance from tumor to the internal anal sphincter is 11.5 cm.   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 BPH  4. History of tobacco use  5.History of an irregular mole at the right upper back  6. Clinical stage III rectal cancer ? Colonoscopy 02/22/2019-malignant completely obstructing tumor in the proximal rectum. Polyps in the mid and distal rectum; polyps in the ascending, transverse/distal transverse colon. 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 Rectum biopsy shows high-grade dysplasia with focal features suspicious for adenocarcinoma. ? Radiation/Xeloda 03/06/2019-04/12/2019 ? Flexible sigmoidoscopy 06/11/2019-infiltrative and ulcerated completely obstructing large mass found in the rectosigmoid colon/very proximal rectum. Mass circumferential. No bleeding present. Lesion now less fungating but still large in size. 25 mm polyp found in the proximal rectum. 12 mm polyp found in the distal rectum. ? Radiation/Xeloda 03/06/2019-04/12/2019 ? MRI pelvis 08/17/2019-4.8 cm apple core lesion at the rectosigmoid junction, T3B N0; distance from tumor to the internal anal sphincter is 11.5 cm. ? Low anterior resection and repair of parastomal hernia 10/18/2019,pT4pN1a,tumor involves the visceral peritoneum, adenocarcinoma present at the serosa of the distal resection margin, lymphovascular invasion present, 1/22 lymph nodes positive, multiple tumor deposits;MSI stable ? Cycle 1 adjuvant Xeloda 12/24/2019, took wrong dose and missed several days, discontinued 01/15/2020 ? Cycle 2 adjuvant Xeloda 01/21/2020 ? Cycle 3 adjuvant Xeloda2/27/2021 ? Cycle 4 adjuvant Xeloda 03/01/2020 ? CTs 03/26/2020-wall thickening in the remaining rectum with a soft tissue mass at the superior rectum, enlarged  retroperitoneal lymph nodes, bladder wall thickening, no evidence of metastatic disease in the chest  7. 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 Tumor  appears to involve superficial submucosa. Tumor present at cauterized resection margin (polyp base). No evidence of lymphovascular invasion. Other polyp showing tubulovillous adenoma with focal high-grade dysplasia. 8.  Admission 04/22/2020 with symptoms and an abdominal x-ray consistent with a bowel obstruction  CT abdomen/pelvis 04/22/2020-progression of local tumor recurrence in the presacral region, increased right hydronephrosis, small bowel dilatation extending from the proximal jejunum to mid-distal ileum, enlarged prostate with diffuse bladder wall thickening, stable aortocaval nodes  9.  Anemia secondary chronic disease, renal insufficiency, and possibly bleeding from tumor involving the GI/GU tract   Shawn Chambers appears stable.  General surgery has evaluated and he is not a surgical candidate.  Palliative G-tube was placed by IR on 04/24/2020.  He has decided against further chemotherapy.  Discussed with the patient and the family, and he is agreeable to discharge home with hospice once he is medically stable.  CODE STATUS also reviewed with the patient and his daughter and he is agreeable to a DNR.  TOC consult was placed on 04/23/2020 to make arrangements for hospice of [facility] to follow the patient once he is discharged.  Recommendations: 1.  The patient be discharged home when medically stable with hospice of [facility] to follow him. 2.  We will arrange for outpatient follow-up at the cancer center.  The patient will be contacted with the date and time.   LOS: 3 days   Mikey Bussing, NP

## 2020-04-25 NOTE — Telephone Encounter (Signed)
Scheduled appt per 5/14 sch message - pt daughter is aware of appt date and time

## 2020-04-30 ENCOUNTER — Ambulatory Visit: Payer: Medicare Other | Admitting: Nurse Practitioner

## 2020-04-30 ENCOUNTER — Telehealth: Payer: Self-pay | Admitting: *Deleted

## 2020-04-30 ENCOUNTER — Telehealth: Payer: Self-pay | Admitting: Oncology

## 2020-04-30 NOTE — Telephone Encounter (Signed)
Called daughter to confirm he can make the 0915 visit today. Patient and son decided they prefer to wait till next week since the Hospice nurse is checking on him. Scheduling message sent to reschedule for week of 5/24.

## 2020-04-30 NOTE — Telephone Encounter (Signed)
Scheduled apt per 5/19 sch message - pt daughter  aware of appt date and time

## 2020-05-01 ENCOUNTER — Encounter: Payer: Self-pay | Admitting: Internal Medicine

## 2020-05-07 ENCOUNTER — Encounter (HOSPITAL_COMMUNITY): Payer: Self-pay | Admitting: Oncology

## 2020-05-07 ENCOUNTER — Telehealth: Payer: Self-pay | Admitting: *Deleted

## 2020-05-07 NOTE — Telephone Encounter (Addendum)
Call from Hospice RN that he continues to have yellow liquid stools in colostomy--has already emptied bag X 5 today. Hospice provider suggests adding Questran if Dr. Benay Spice is in agreement, or if there is anything else he suggests. Patient still plans to come to office for visit on 5/27. Per Dr. Benay Spice: Shawn Chambers is OK and can add Imodium prn. Hospice RN notified: was started on 4 grams Questran daily.

## 2020-05-08 ENCOUNTER — Inpatient Hospital Stay: Payer: Medicare Other | Admitting: Nurse Practitioner

## 2020-05-19 ENCOUNTER — Telehealth: Payer: Self-pay | Admitting: *Deleted

## 2020-05-19 MED ORDER — LOPERAMIDE HCL 2 MG PO CAPS: 4.0000 mg | ORAL_CAPSULE | Freq: Four times a day (QID) | ORAL | 0 refills | Status: DC | PRN

## 2020-05-19 NOTE — Telephone Encounter (Signed)
Still having a lot of diarrhea in his ostomy--has had to change bag X 8 today. Patient thinks it is worse since he started the [place]. Asking if he can take Imodium? Provided verbal order to take Imodium 4 mg qid (max 8/day) and call back if not better.

## 2020-05-30 ENCOUNTER — Telehealth: Payer: Self-pay | Admitting: *Deleted

## 2020-05-30 NOTE — Telephone Encounter (Signed)
Notified daughter that completed FMLA forms were faxed to [employer] center at [phone]. Originals mailed to her home at [address], [place], [place] [address]. Copies returned to Disability specialist to document on spread sheet and then have sent to scanning.

## 2020-06-18 ENCOUNTER — Other Ambulatory Visit (HOSPITAL_COMMUNITY): Payer: Self-pay | Admitting: Urology

## 2020-06-18 ENCOUNTER — Other Ambulatory Visit: Payer: Self-pay | Admitting: Urology

## 2020-06-18 DIAGNOSIS — N50811 Right testicular pain: Secondary | ICD-10-CM

## 2020-06-18 DIAGNOSIS — N5089 Other specified disorders of the male genital organs: Secondary | ICD-10-CM

## 2020-06-19 ENCOUNTER — Other Ambulatory Visit: Payer: Self-pay | Admitting: Urology

## 2020-06-19 ENCOUNTER — Encounter (HOSPITAL_COMMUNITY): Payer: Self-pay | Admitting: Urology

## 2020-06-19 ENCOUNTER — Observation Stay (HOSPITAL_COMMUNITY): Admitting: Anesthesiology

## 2020-06-19 ENCOUNTER — Other Ambulatory Visit: Payer: Self-pay

## 2020-06-19 ENCOUNTER — Inpatient Hospital Stay (HOSPITAL_COMMUNITY)
Admission: RE | Admit: 2020-06-19 | Discharge: 2020-06-22 | DRG: 711 | Disposition: A | Discharge status: Hospice | Attending: Family Medicine | Admitting: Family Medicine

## 2020-06-19 ENCOUNTER — Encounter (HOSPITAL_COMMUNITY)
Admission: RE | Disposition: A | Discharge status: Hospice | Payer: Self-pay | Source: Home / Self Care | Attending: Family Medicine

## 2020-06-19 DIAGNOSIS — R3915 Urgency of urination: Secondary | ICD-10-CM | POA: Diagnosis present

## 2020-06-19 DIAGNOSIS — E871 Hypo-osmolality and hyponatremia: Secondary | ICD-10-CM | POA: Diagnosis present

## 2020-06-19 DIAGNOSIS — E872 Acidosis: Secondary | ICD-10-CM | POA: Diagnosis present

## 2020-06-19 DIAGNOSIS — K219 Gastro-esophageal reflux disease without esophagitis: Secondary | ICD-10-CM | POA: Diagnosis present

## 2020-06-19 DIAGNOSIS — D630 Anemia in neoplastic disease: Secondary | ICD-10-CM | POA: Diagnosis present

## 2020-06-19 DIAGNOSIS — Z933 Colostomy status: Secondary | ICD-10-CM

## 2020-06-19 DIAGNOSIS — N454 Abscess of epididymis or testis: Secondary | ICD-10-CM | POA: Diagnosis not present

## 2020-06-19 DIAGNOSIS — N3941 Urge incontinence: Secondary | ICD-10-CM | POA: Diagnosis present

## 2020-06-19 DIAGNOSIS — Z20822 Contact with and (suspected) exposure to covid-19: Secondary | ICD-10-CM | POA: Diagnosis present

## 2020-06-19 DIAGNOSIS — D62 Acute posthemorrhagic anemia: Secondary | ICD-10-CM | POA: Diagnosis not present

## 2020-06-19 DIAGNOSIS — Z87891 Personal history of nicotine dependence: Secondary | ICD-10-CM

## 2020-06-19 DIAGNOSIS — N493 Fournier gangrene: Principal | ICD-10-CM | POA: Diagnosis present

## 2020-06-19 DIAGNOSIS — C189 Malignant neoplasm of colon, unspecified: Secondary | ICD-10-CM | POA: Diagnosis present

## 2020-06-19 DIAGNOSIS — N400 Enlarged prostate without lower urinary tract symptoms: Secondary | ICD-10-CM | POA: Diagnosis present

## 2020-06-19 DIAGNOSIS — Z85528 Personal history of other malignant neoplasm of kidney: Secondary | ICD-10-CM

## 2020-06-19 DIAGNOSIS — Z515 Encounter for palliative care: Secondary | ICD-10-CM | POA: Diagnosis present

## 2020-06-19 DIAGNOSIS — D638 Anemia in other chronic diseases classified elsewhere: Secondary | ICD-10-CM | POA: Diagnosis present

## 2020-06-19 DIAGNOSIS — Z9049 Acquired absence of other specified parts of digestive tract: Secondary | ICD-10-CM

## 2020-06-19 DIAGNOSIS — N179 Acute kidney failure, unspecified: Secondary | ICD-10-CM | POA: Diagnosis present

## 2020-06-19 DIAGNOSIS — N453 Epididymo-orchitis: Secondary | ICD-10-CM | POA: Diagnosis present

## 2020-06-19 DIAGNOSIS — E861 Hypovolemia: Secondary | ICD-10-CM | POA: Diagnosis present

## 2020-06-19 DIAGNOSIS — N492 Inflammatory disorders of scrotum: Secondary | ICD-10-CM | POA: Diagnosis present

## 2020-06-19 DIAGNOSIS — N1831 Chronic kidney disease, stage 3a: Secondary | ICD-10-CM | POA: Diagnosis present

## 2020-06-19 HISTORY — PX: SCROTAL EXPLORATION: SHX2386

## 2020-06-19 LAB — COMPREHENSIVE METABOLIC PANEL
ALT: 27 U/L (ref 0–44)
AST: 19 U/L (ref 15–41)
Albumin: 2.7 g/dL — ABNORMAL LOW (ref 3.5–5.0)
Alkaline Phosphatase: 149 U/L — ABNORMAL HIGH (ref 38–126)
Anion gap: 13 (ref 5–15)
BUN: 35 mg/dL — ABNORMAL HIGH (ref 8–23)
CO2: 17 mmol/L — ABNORMAL LOW (ref 22–32)
Calcium: 9 mg/dL (ref 8.9–10.3)
Chloride: 101 mmol/L (ref 98–111)
Creatinine, Ser: 2.13 mg/dL — ABNORMAL HIGH (ref 0.61–1.24)
GFR calc Af Amer: 33 mL/min — ABNORMAL LOW (ref >60)
GFR calc non Af Amer: 28 mL/min — ABNORMAL LOW (ref >60)
Glucose, Bld: 131 mg/dL — ABNORMAL HIGH (ref 70–99)
Potassium: 3.8 mmol/L (ref 3.5–5.1)
Sodium: 131 mmol/L — ABNORMAL LOW (ref 135–145)
Total Bilirubin: 0.5 mg/dL (ref 0.3–1.2)
Total Protein: 7.4 g/dL (ref 6.5–8.1)

## 2020-06-19 LAB — CBC
HCT: 25.8 % — ABNORMAL LOW (ref 39.0–52.0)
Hemoglobin: 7.7 g/dL — ABNORMAL LOW (ref 13.0–17.0)
MCH: 25.6 pg — ABNORMAL LOW (ref 26.0–34.0)
MCHC: 29.8 g/dL — ABNORMAL LOW (ref 30.0–36.0)
MCV: 85.7 fL (ref 80.0–100.0)
Platelets: 401 10*3/uL — ABNORMAL HIGH (ref 150–400)
RBC: 3.01 MIL/uL — ABNORMAL LOW (ref 4.22–5.81)
RDW: 17.5 % — ABNORMAL HIGH (ref 11.5–15.5)
WBC: 17.5 10*3/uL — ABNORMAL HIGH (ref 4.0–10.5)
nRBC: 0 % (ref 0.0–0.2)

## 2020-06-19 LAB — SARS CORONAVIRUS 2 BY RT PCR (HOSPITAL ORDER, PERFORMED IN ~~LOC~~ HOSPITAL LAB): SARS Coronavirus 2: NEGATIVE

## 2020-06-19 SURGERY — EXPLORATION, SCROTUM
Anesthesia: General | Site: Scrotum | Laterality: Right

## 2020-06-19 MED ORDER — 0.9 % SODIUM CHLORIDE (POUR BTL) OPTIME
TOPICAL | Status: DC | PRN
  Administered 2020-06-19: 1000 mL

## 2020-06-19 MED ORDER — FENTANYL CITRATE (PF) 100 MCG/2ML IJ SOLN: 25.0000 ug | INTRAMUSCULAR | Status: DC | PRN

## 2020-06-19 MED ORDER — ROCURONIUM BROMIDE 10 MG/ML (PF) SYRINGE
PREFILLED_SYRINGE | INTRAVENOUS | Status: AC
  Filled 2020-06-19: qty 10

## 2020-06-19 MED ORDER — PHENYLEPHRINE 40 MCG/ML (10ML) SYRINGE FOR IV PUSH (FOR BLOOD PRESSURE SUPPORT)
PREFILLED_SYRINGE | INTRAVENOUS | Status: AC
  Filled 2020-06-19: qty 20

## 2020-06-19 MED ORDER — ONDANSETRON HCL 4 MG/2ML IJ SOLN
INTRAMUSCULAR | Status: DC | PRN
  Administered 2020-06-19: 4 mg via INTRAVENOUS

## 2020-06-19 MED ORDER — CLINDAMYCIN PHOSPHATE 900 MG/50ML IV SOLN
900.0000 mg | Freq: Three times a day (TID) | INTRAVENOUS | Status: DC
  Administered 2020-06-20 – 2020-06-22 (×8): 900 mg via INTRAVENOUS
  Filled 2020-06-19 (×11): qty 50

## 2020-06-19 MED ORDER — FENTANYL CITRATE (PF) 100 MCG/2ML IJ SOLN
INTRAMUSCULAR | Status: DC | PRN
  Administered 2020-06-19 (×3): 50 ug via INTRAVENOUS

## 2020-06-19 MED ORDER — SODIUM CHLORIDE 0.9 % IV SOLN
2.0000 g | INTRAVENOUS | Status: AC
  Administered 2020-06-19: 2 g via INTRAVENOUS
  Filled 2020-06-19: qty 2

## 2020-06-19 MED ORDER — ONDANSETRON HCL 4 MG/2ML IJ SOLN
INTRAMUSCULAR | Status: AC
  Filled 2020-06-19: qty 2

## 2020-06-19 MED ORDER — SUCCINYLCHOLINE CHLORIDE 200 MG/10ML IV SOSY
PREFILLED_SYRINGE | INTRAVENOUS | Status: DC | PRN
  Administered 2020-06-19: 120 mg via INTRAVENOUS

## 2020-06-19 MED ORDER — PIPERACILLIN-TAZOBACTAM 3.375 G IVPB
3.3750 g | Freq: Three times a day (TID) | INTRAVENOUS | Status: DC
  Administered 2020-06-20 – 2020-06-22 (×7): 3.375 g via INTRAVENOUS
  Filled 2020-06-19 (×6): qty 50

## 2020-06-19 MED ORDER — FENTANYL CITRATE (PF) 250 MCG/5ML IJ SOLN
INTRAMUSCULAR | Status: AC
  Filled 2020-06-19: qty 5

## 2020-06-19 MED ORDER — SUCCINYLCHOLINE CHLORIDE 200 MG/10ML IV SOSY
PREFILLED_SYRINGE | INTRAVENOUS | Status: AC
  Filled 2020-06-19: qty 10

## 2020-06-19 MED ORDER — VANCOMYCIN HCL 750 MG/150ML IV SOLN
750.0000 mg | INTRAVENOUS | Status: DC
  Administered 2020-06-21 (×2): 750 mg via INTRAVENOUS
  Filled 2020-06-19 (×2): qty 150

## 2020-06-19 MED ORDER — BUPIVACAINE HCL 0.25 % IJ SOLN
INTRAMUSCULAR | Status: AC
  Filled 2020-06-19: qty 1

## 2020-06-19 MED ORDER — CHLORHEXIDINE GLUCONATE 0.12 % MT SOLN
15.0000 mL | Freq: Once | OROMUCOSAL | Status: AC
  Administered 2020-06-19: 15 mL via OROMUCOSAL

## 2020-06-19 MED ORDER — EPHEDRINE 5 MG/ML INJ
INTRAVENOUS | Status: AC
  Filled 2020-06-19: qty 10

## 2020-06-19 MED ORDER — LIDOCAINE 2% (20 MG/ML) 5 ML SYRINGE
INTRAMUSCULAR | Status: DC | PRN
  Administered 2020-06-19: 60 mg via INTRAVENOUS

## 2020-06-19 MED ORDER — PROPOFOL 10 MG/ML IV BOLUS
INTRAVENOUS | Status: DC | PRN
  Administered 2020-06-19: 110 mg via INTRAVENOUS

## 2020-06-19 MED ORDER — PHENYLEPHRINE 40 MCG/ML (10ML) SYRINGE FOR IV PUSH (FOR BLOOD PRESSURE SUPPORT)
PREFILLED_SYRINGE | INTRAVENOUS | Status: DC | PRN
  Administered 2020-06-19: 120 ug via INTRAVENOUS
  Administered 2020-06-19: 80 ug via INTRAVENOUS
  Administered 2020-06-19: 120 ug via INTRAVENOUS
  Administered 2020-06-19: 40 ug via INTRAVENOUS

## 2020-06-19 MED ORDER — EPHEDRINE SULFATE-NACL 50-0.9 MG/10ML-% IV SOSY
PREFILLED_SYRINGE | INTRAVENOUS | Status: DC | PRN
  Administered 2020-06-19: 10 mg via INTRAVENOUS

## 2020-06-19 MED ORDER — LACTATED RINGERS IV SOLN: INTRAVENOUS | Status: DC

## 2020-06-19 MED ORDER — PROPOFOL 10 MG/ML IV BOLUS
INTRAVENOUS | Status: AC
  Filled 2020-06-19: qty 20

## 2020-06-19 MED ORDER — VANCOMYCIN HCL 1750 MG/350ML IV SOLN
1750.0000 mg | Freq: Once | INTRAVENOUS | Status: AC
  Administered 2020-06-19: 1750 mg via INTRAVENOUS
  Filled 2020-06-19 (×2): qty 350

## 2020-06-19 MED ORDER — LIDOCAINE 2% (20 MG/ML) 5 ML SYRINGE
INTRAMUSCULAR | Status: AC
  Filled 2020-06-19: qty 5

## 2020-06-19 SURGICAL SUPPLY — 23 items
BLADE HEX COATED 2.75 (ELECTRODE) ×3 IMPLANT
BNDG GAUZE ELAST 4 BULKY (GAUZE/BANDAGES/DRESSINGS) ×3 IMPLANT
COVER SURGICAL LIGHT HANDLE (MISCELLANEOUS) ×3 IMPLANT
COVER WAND RF STERILE (DRAPES) IMPLANT
DRSG PAD ABDOMINAL 8X10 ST (GAUZE/BANDAGES/DRESSINGS) ×3 IMPLANT
ELECT REM PT RETURN 15FT ADLT (MISCELLANEOUS) ×3 IMPLANT
GLOVE BIOGEL M STRL SZ7.5 (GLOVE) ×3 IMPLANT
GOWN STRL REUS W/TWL LRG LVL3 (GOWN DISPOSABLE) ×3 IMPLANT
KIT BASIN (CUSTOM PROCEDURE TRAY) ×3 IMPLANT
KIT TURNOVER KIT A (KITS) IMPLANT
NEEDLE HYPO 22GX1.5 SAFETY (NEEDLE) IMPLANT
NS IRRIG 1000ML POUR BTL (IV SOLUTION) ×3 IMPLANT
PACK GENERAL/GYN (CUSTOM PROCEDURE TRAY) ×3 IMPLANT
PENCIL SMOKE EVACUATOR (MISCELLANEOUS) IMPLANT
SUPPORT SCROTAL LG STRP (MISCELLANEOUS) IMPLANT
SUPPORTER ATHLETIC LG (MISCELLANEOUS)
SUT CHROMIC 3 0 SH 27 (SUTURE) ×6 IMPLANT
SUT PDS AB 0 CT 36 (SUTURE) ×6 IMPLANT
SUT VIC AB 2-0 UR5 27 (SUTURE) IMPLANT
SUT VICRYL 0 TIES 12 18 (SUTURE) IMPLANT
SYR CONTROL 10ML LL (SYRINGE) IMPLANT
TOWEL OR 17X26 10 PK STRL BLUE (TOWEL DISPOSABLE) ×6 IMPLANT
WATER STERILE IRR 1000ML POUR (IV SOLUTION) ×3 IMPLANT

## 2020-06-19 NOTE — Anesthesia Postprocedure Evaluation (Signed)
Anesthesia Post Note  Patient: Stratton Padget  Procedure(s) Performed: SCROTUM EXPLORATION,  RIGHT SIMPLE ORCHIECTOMY (Right Scrotum)     Patient location during evaluation: PACU Anesthesia Type: General Level of consciousness: awake and alert Pain management: pain level controlled Vital Signs Assessment: post-procedure vital signs reviewed and stable Respiratory status: spontaneous breathing, nonlabored ventilation, respiratory function stable and patient connected to nasal cannula oxygen Cardiovascular status: blood pressure returned to baseline and stable Postop Assessment: no apparent nausea or vomiting Anesthetic complications: no   No complications documented.  Last Vitals:  Vitals:   06/19/20 2000 06/19/20 2015  BP: 128/64   Pulse:    Resp:    Temp:  37.2 C  SpO2:      Last Pain:  Vitals:   06/19/20 2015  TempSrc:   PainSc: 0-No pain                 Effie Berkshire

## 2020-06-19 NOTE — Interval H&P Note (Signed)
History and Physical Interval Note:  06/19/2020 4:08 PM  Shawn Chambers  has presented today for surgery, with the diagnosis of SCROTAL/ TESTICULAR ABSCESS.  The various methods of treatment have been discussed with the patient and family. After consideration of risks, benefits and other options for treatment, the patient has consented to  Procedure(s): [place] (Right) as a surgical intervention.  The patient's history has been reviewed, patient examined, no change in status, stable for surgery.  I have reviewed the patient's chart and labs.  Questions were answered to the patient's satisfaction.     Remi Haggard

## 2020-06-19 NOTE — Progress Notes (Signed)
Called by Dr. 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  Wanted [place] to broaden ABx therapy as a result.  Therefore will put on empiric broad spectrum ABx for fournier's coverage for the moment until cultures come back: so putting patient on zosyn / clinda / vanc.  Cultures obtained intra-op and are pending.

## 2020-06-19 NOTE — Anesthesia Preprocedure Evaluation (Addendum)
Anesthesia Evaluation  Patient identified by MRN, date of birth, ID band Patient awake    Reviewed: Allergy & Precautions, NPO status , Patient's Chart, lab work & pertinent test results  Airway Mallampati: I  TM Distance: >3 FB Neck ROM: Full    Dental  (+) Missing, Poor Dentition, Chipped   Pulmonary Patient abstained from smoking., former smoker,    breath sounds clear to auscultation       Cardiovascular negative cardio ROS   Rhythm:Regular Rate:Normal     Neuro/Psych Seizures -,  negative psych ROS   GI/Hepatic Neg liver ROS, GERD  ,  Endo/Other  negative endocrine ROS  Renal/GU Renal disease     Musculoskeletal  (+) Arthritis ,   Abdominal Normal abdominal exam  (+)   Peds  Hematology negative hematology ROS (+)   Anesthesia Other Findings   Reproductive/Obstetrics                            Anesthesia Physical Anesthesia Plan  ASA: III  Anesthesia Plan: General   Post-op Pain Management:    Induction: Intravenous, Rapid sequence and Cricoid pressure planned  PONV Risk Score and Plan: 3 and Ondansetron and Treatment may vary due to age or medical condition  Airway Management Planned: Oral ETT  Additional Equipment: None  Intra-op Plan:   Post-operative Plan: Extubation in OR  Informed Consent: I have reviewed the patients History and Physical, chart, labs and discussed the procedure including the risks, benefits and alternatives for the proposed anesthesia with the patient or authorized representative who has indicated his/her understanding and acceptance.     Dental advisory given  Plan Discussed with: CRNA  Anesthesia Plan Comments:        Anesthesia Quick Evaluation

## 2020-06-19 NOTE — H&P (Signed)
History and Physical   Shawn Chambers IRS:[id number] DOB: 02/23/1941 DOA: 06/19/2020  Referring MD/NP/PA: Dr. Milford Cage PCP: [name] Outpatient Specialists: [facility], [facility]  Patient coming from: Home by way of urology office  Chief Complaint: scrotum pain  HPI: Shawn Chambers is an 79 y.o. male with a history of colorectal cancer s/p colectomy and colostomy 2018, BPH, GERD, and SBO s/p venting PEG placement at recent admission where he was discharged 5/14 with home hospice who presented back to urology office today for follow up of worsening, severe, constant pain in the scrotum worse with any movement, not improved by oral antibiotics (bactrim, cipro). In the office, exam was consistent with cellulitis and U/S demonstrated testicular abscess. Surgical management was recommended which is planned this evening. Hospitalists called to admit. The patient voices no other complaints. He has been taking a lot of aleve and advil for arthritis-related pain, places eye drops nightly, and takes medications to prevent constipation and has had normal colostomy output recently. No other medications.   Review of Systems: Pt is HOH, unchanged. Denies fever, chills, weight loss, changes in vision or hearing, headache, cough, sore throat, chest pain, palpitations, shortness of breath, abdominal pain, nausea, vomiting, changes in bowel habits, blood in stool, change in bladder habits, myalgias, arthralgias, rashes, and per HPI. All others reviewed and are negative.   Past Medical History:  Diagnosis Date  . Arthritis    hand, knee- ? Dx.  Marland Kitchen BPH (benign prostatic hyperplasia)   . 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 Cataract    removed  . GERD (gastroesophageal reflux disease)   . Glaucoma   . Hearing loss   . Rectal adenocarcinoma ([place])   . Renal cancer ([place])   . Seizures ([place])    resolved- daughter states she didnt know he  ever had seizures   Past Surgical History:  Procedure Laterality Date  . APPENDECTOMY    . COLONOSCOPY    . COLOSTOMY REVISION N/A 10/18/2019   Procedure: OSTOMY REVISION;  Surgeon: Leighton Ruff, MD;  Location: [facility];  Service: General;  Laterality: N/A;  . CYSTOSCOPY WITH STENT PLACEMENT Bilateral 10/18/2019   Procedure: CYSTOSCOPY WITH FIREFLY INJECTION;  Surgeon: Cleon Gustin, MD;  Location: [facility];  Service: Urology;  Laterality: Bilateral;  . IR GASTROSTOMY TUBE MOD SED  04/24/2020  . PARTIAL COLECTOMY N/A 03/28/2017   Procedure: PARTIAL COLECTOMY/COLOSTOMY;  Surgeon: Georganna Skeans, MD;  Location: [place];  Service: General;  Laterality: N/A;  Partial Colectomy, Mobilization of Splenic Flexure, Colostomy  . PARTIAL NEPHRECTOMY Left 03/28/2017   Procedure: NEPHRECTOMY TOTAL;  Surgeon: Raynelle Bring, MD;  Location: [place];  Service: Urology;  Laterality: Left;  . XI ROBOTIC ASSISTED LOWER ANTERIOR RESECTION N/A 10/18/2019   Procedure: XI ROBOTIC ASSISTED LOWER ANTERIOR RESECTION WITH PERISTOMAL HERNIA REPAIR WITH MESH ( SUGAR BAKER);  Surgeon: Leighton Ruff, MD;  Location: [facility];  Service: General;  Laterality: N/A;   - Smoked from the age of 29, over the past few years he's cut down to minimal cigarette smoking. Drinks alcohol very infrequently. Lives at home next door to his sister who brings him meals. Has brothers and children in the area that he sees regularly as well.   No Known Allergies Family History  Problem Relation Age of Onset  . Esophageal cancer Paternal Uncle   . Colon polyps Daughter   . Colon cancer Neg Hx   . Rectal cancer Neg Hx   .  Stomach cancer Neg Hx    - Family history otherwise reviewed and not pertinent.   Prior to Admission medications   Medication Sig Start Date End Date Taking? Authorizing Provider  ADVIL 200 MG CAPS Take 1 capsule by mouth 4 (four) times daily as needed (pain).  05/30/20  Yes [provider]  bisacodyl (DULCOLAX) 5 MG EC  tablet Take 5 mg by mouth daily as needed for moderate constipation.   Yes [provider]  cholestyramine Lucrezia Starch) 4 GM/DOSE powder Take 4 g by mouth daily.  05/07/20  Yes [provider]  ciprofloxacin (CIPRO) 500 MG tablet Take 500 mg by mouth 2 (two) times daily. 06/18/20  Yes [provider]  [employer] PURELAX 17 g packet Take 1 packet by mouth daily as needed for moderate constipation.  04/25/20  Yes [provider]  DIFLUCAN 100 MG tablet Take 100 mg by mouth daily. 06/18/20  Yes [provider]  docusate sodium (COLACE) 100 MG capsule Take 100 mg by mouth 2 (two) times daily as needed for mild constipation.    Yes [provider]  ibuprofen (ADVIL) 200 MG tablet Take 200 mg by mouth every 6 (six) hours as needed.   Yes [provider]  latanoprost (XALATAN) 0.005 % ophthalmic solution Place 1 drop into both eyes at bedtime.  12/27/18  Yes [provider]  loperamide (IMODIUM) 2 MG capsule Take 2 capsules (4 mg total) by mouth 4 (four) times daily as needed for diarrhea or loose stools. 05/19/20  Yes Ladell Pier, MD  magnesium citrate SOLN Take 0.5 Bottles by mouth as needed for severe constipation.   Yes [provider]  ondansetron (ZOFRAN) 4 MG tablet Take 4 mg by mouth every 8 (eight) hours as needed for nausea or vomiting.  04/25/20  Yes [provider]  tamsulosin (FLOMAX) 0.4 MG CAPS capsule Take 0.8 mg by mouth at bedtime.  02/03/19  Yes [provider]  HYDROcodone-acetaminophen (NORCO/VICODIN) 5-325 MG tablet Take 0.5-1 tablets by mouth every 4 (four) hours as needed for moderate pain or severe pain.  06/18/20   [provider]    Physical Exam: Vitals:   06/19/20 1532 06/19/20 1551 06/19/20 1614  BP: (!) 123/55    Pulse: 88    Resp: 18    Temp: 98.3 F (36.8 C)    TempSrc: Oral    SpO2: 94%    Weight:   69.5 kg  Height:  5\' 9"  (1.753 m)    Constitutional: Pleasant elderly male  in no distress, calm demeanor Eyes: Lids and conjunctivae normal, PERRL ENMT: Mucous membranes are moist. Posterior pharynx clear of any exudate or lesions. Poor dentition.  Neck: normal, supple, no masses, no thyromegaly Respiratory: Non-labored breathing room air without accessory muscle use. Clear breath sounds to auscultation bilaterally Cardiovascular: Regular rate and rhythm with frequent premature beats, no murmurs, rubs, or gallops. No carotid bruits. No JVD. No LE edema. Palpable pedal pulses. Abdomen: Normoactive bowel sounds. No tenderness, non-distended, and no masses palpated. No hepatosplenomegaly. GU: No indwelling catheter. Scrotum is warm, tender, and diffusely edematous and erythematous. Shoddy regional lymphadenopathy without tracking erythema.  Musculoskeletal: No clubbing / cyanosis. No joint deformity upper and lower extremities. Good ROM, no contractures. Normal muscle tone.  Skin: Warm, dry. No rashes, wounds, or ulcers. No significant lesions noted.  Neurologic: CN II-XII grossly intact. Speech normal. HOH, no other focal deficits in motor strength or sensation in all extremities.  Psychiatric: Alert and oriented  x3. Normal judgment and insight. Mood euthymic with congruent affect.   Labs on Admission: I have personally reviewed following labs and imaging studies  CBC: Recent Labs  Lab 06/19/20 1600  WBC 17.5*  HGB 7.7*  HCT 25.8*  MCV 85.7  PLT 887*   Basic Metabolic Panel: Recent Labs  Lab 06/19/20 1600  NA 131*  K 3.8  CL 101  CO2 17*  GLUCOSE 131*  BUN 35*  CREATININE 2.13*  CALCIUM 9.0   GFR: Estimated Creatinine Clearance: 27.2 mL/min (A) (by C-G formula based on SCr of 2.13 mg/dL (H)). Liver Function Tests: Recent Labs  Lab 06/19/20 1600  AST 19  ALT 27  ALKPHOS 149*  BILITOT 0.5  PROT 7.4  ALBUMIN 2.7*    Collection Time: 06/19/20  3:23 PM   Specimen: Nasopharyngeal Swab  Result Value Ref Range Status   SARS Coronavirus 2 NEGATIVE  NEGATIVE Final    EKG: Ordered. CXR ordered.  Assessment/Plan Active Problems:   Testicular abscess   Scrotal cellulitis and testicular abscess: Failed outpatient Tx. Moderate in severity with leukocytosis, afebrile, nontoxic, not septic.  - Ceftriaxone started, will add doxycycline with suspected purulent cellulitis.  - Will monitor intraoperative cultures.  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 Alternatively, right hydronephrosis noted on previous CT may have advanced, though don't believe any intervention would be warranted in this hospice patients.  - Continue LR overnight, no evidence of overload. Recheck BMP in AM.  - Avoid nephrotoxins if able, prefer to avoid NSAIDs  Colon cancer: No further interventions planned.  - Continue home hospice care.   History of SBO with venting gastrostomy: - Continue periodic venting here - Aggressive bowel regimen continued.   BPH:  - Continue flomax  Anemia of chronic disease: Likely both due to malignancy and CKD. Low EBL anticipated, though with IVF, hgb may drop below 7g/dl.  - Check CBC in AM, include T&S.   Hyponatremia:  - Monitor with LR.   DVT prophylaxis: Heparin  Code Status: Full, as discussed at length on admission. He would want initial resuscitative efforts, but not protracted attempts, during this hospitalization.  Family Communication: None at bedside Disposition Plan: Return home with hospice if stable 7/9  Consults called: Urology, Dr. Milford Cage  Admission status: Observation    Patrecia Pour, MD [facility] [email or web address] 06/19/2020, 6:13 PM

## 2020-06-19 NOTE — Transfer of Care (Signed)
Immediate Anesthesia Transfer of Care Note  Patient: Shawn Chambers  Procedure(s) Performed: SCROTUM EXPLORATION,  RIGHT SIMPLE ORCHIECTOMY (Right Scrotum)  Patient Location: PACU  Anesthesia Type:General  Level of Consciousness: sedated, patient cooperative and responds to stimulation  Airway & Oxygen Therapy: Patient Spontanous Breathing and Patient connected to face mask oxygen  Post-op Assessment: Report given to RN and Post -op Vital signs reviewed and stable  Post vital signs: Reviewed and stable  Last Vitals:  Vitals Value Taken Time  BP 137/66 06/19/20 1949  Temp    Pulse    Resp 15 06/19/20 1951  SpO2    Vitals shown include unvalidated device data.  Last Pain:  Vitals:   06/19/20 1542  TempSrc:   PainSc: 10-Worst pain ever         Complications: No complications documented.

## 2020-06-19 NOTE — Op Note (Signed)
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  Simple right orchiectomy performed and scrotal debridement performed. Operative note: After obtaining informed consent for the patient he was taken the major OR suite placed under general anesthesia.  Placed in supine position and his scrotal area prepped and draped in usual sterile fashion.  Foley catheter was placed to drain the bladder.  There is obvious abscess noted along the right hemiscrotum.  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  The scrotum was opened widely and the testicle was essentially necrotic.  I isolated the testicle and traced back to the spermatic cord.  A Kelly clamp was placed across the spermatic cord proximally and the testicle and a portion of the spermatic cord was removed.  The cut end of the spermatic cord was then oversewn with #1 PDS ligated suture.  The scrotal edges were then debrided with sharp debridement and there was some gray exudate noted which was consistent with possible early Fournier's.  I continued debridement until there was good bleeding from the cut edge of the skin.  Hemostasis was then obtained with the Bovie electrocautery.  The wound was then copiously irrigated with antibiotic saline.  Wound again inspected no evidence of further necrosis or evidence of tracking along the spermatic cord to suggest Fournier's spread.  The wound was packed with saline soaked  Kerlix dressing and then dry dressing placed over top.  Procedure was terminated he was awakened from anesthesia and taken back to the recovery room in stable condition.  No immediate complication from the procedure.

## 2020-06-19 NOTE — Progress Notes (Signed)
Pharmacy Antibiotic Note  Gared Gillie is a 79 y.o. male admitted on 06/19/2020 with scrotal abscess, early Fournier's.  Pharmacy has been consulted for Zosyn and vancomycin dosing.  Plan: Vancomycin 1750 mg iv once followed by 750 mg iv q 24 h. Goal trough 15-20 mcg/ml.   Zosyn 3.375 g EI q 8 hours.   Clindamycin 900 mg iv q 8 hours per MD.   F/U renal function, culture results, levels as indicated.   Height: 5\' 9"  (175.3 cm) Weight: 69.5 kg (153 lb 3.2 oz) IBW/kg (Calculated) : 70.7  Temp (24hrs), Avg:98.7 F (37.1 C), Min:98.3 F (36.8 C), Max:99.1 F (37.3 C)  Recent Labs  Lab 06/19/20 1600  WBC 17.5*  CREATININE 2.13*    Estimated Creatinine Clearance: 27.2 mL/min (A) (by C-G formula based on SCr of 2.13 mg/dL (H)).    No Known Allergies   Thank you for allowing pharmacy to be a part of this patient's care.  Ulice Dash D 06/19/2020 9:11 PM

## 2020-06-19 NOTE — Anesthesia Procedure Notes (Addendum)
Procedure Name: Intubation Date/Time: 06/19/2020 6:58 PM Performed by: Gean [facility], CRNA Pre-anesthesia Checklist: Patient identified, Emergency Drugs available, Suction available and Patient being monitored Patient Re-evaluated:Patient Re-evaluated prior to induction Oxygen Delivery Method: Circle system utilized Preoxygenation: Pre-oxygenation with 100% oxygen Induction Type: IV induction, Rapid sequence and Cricoid Pressure applied Laryngoscope Size: Mac and 3 Grade View: Grade I Tube type: Oral Tube size: 7.5 mm Number of attempts: 1 Airway Equipment and Method: Stylet and Oral airway Placement Confirmation: ETT inserted through vocal cords under direct vision,  positive ETCO2 and breath sounds checked- equal and bilateral Secured at: 20 cm Tube secured with: Tape Dental Injury: Teeth and Oropharynx as per pre-operative assessment

## 2020-06-19 NOTE — H&P (Signed)
Shawn Chambers is a 79 year-old male established patient who is here for follow up of BPH and lower urinary tract symptoms.   Patient is currently treated with Nothing for his symptoms. The patient complains of lower urinary tract symptom(s) that include frequency, urgency, and nocturia. The patient states his most bothersome symptom(s) are the following: urgency.   He denies any other associated symptoms. The patient states if he were to spend the rest of his life with his current urinary condition, he would be mostly disappointed. His urinary symptoms have been worse since his last office visit. This condition would be considered of mild to moderate severity with no modifying factors or associated signs or symptoms other than as noted above.   02/16/18: He was noted by Dr. Alinda Money at his last visit to have progressive obstructive voiding symptoms with a slow urinary stream. He was not on any form of medication for this. He has a past history of a left apical nodule that was felt to be likely benign with a PSA in 11/15 at 3.93. We have discussed continued monitoring with a recheck again in 6 months however he failed to return.  He reports that over the past year he has noted increasing nocturia now getting up 5-6 times. He does have some urgency but does does not have urge incontinence. He does have hesitancy, a slow stream and occasionally has some intermittency. He denies any dysuria or hematuria.   06/22/18: When I saw him in 3/19 I started him on tamsulosin 0.4 mg. He came in today and reports he continues to have some hesitancy. He also has reported experiencing some dysuria. He gets up about 4 times at night. He was hoping to see more improvement from the tamsulosin. He denies any hematuria.   11/16/18: He came in to see me today with symptoms that are different from what he has had in the past. He now is experiencing frequency, urgency and continues to have nocturia 3-4 times. He is currently taking  tamsulosin 0.8 mg and does not report significant obstructive type symptoms. He also denies any dysuria or hematuria.  -06/17/20-patient is a 79 year old white male with extensive medical history. Has history of metastatic rectal cancer. 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 Unfortunately in the interim he has developed metastatic disease now has required a PEG tube for nutrition and has diverting colostomy. He presents today with complaint of right-sided testicular pain. States he noted swelling approximately 2 weeks ago. No history of GU trauma. Was seen by PCP who placed him on antibiotics for about 1 week but has not noted much change in the swelling. Patient states he has a dull aching character to the right testicle. Here for evaluation of right testicular swelling. Patient is unable to get a urine specimen today. But denies dysuria.  -06/19/20-patient with history of scrotal cellulitis as above. Has been on Cipro 500 mg b.i.d.. States he continues to have some pain but denies any drainage or fever. Now here for follow-up. Unfortunately he had a testicular ultrasound ordered but did not get performed in the interim. He is here now for follow-up. Testicular ultrasound/scrotal ultrasound was done today in the office and reveals what appears to be a large right-sided intra testicular abscess. Left testicle is palpably normal. There does not appear to be any extension into the inguinal canal.     ALLERGIES: No Allergies  MEDICATIONS: Cipro 500 mg tablet 1 tablet PO BID     GU PSH: Radical nephrectomy (laparoscopic) - 2018       PSH Notes: Hand Surgery, Appendectomy   NON-GU PSH: Appendectomy - 2015 Colostomy     GU PMH: Epididymo-orchitis - 06/17/2020 Right testicular pain - 06/17/2020 Scrotal edema - 06/17/2020 Urinary Urgency, He is experiencing frequency, urgency and urge incontinence and has to wear  pad. This is a bother to him and with a low PVR I am going to have him try Myrbetriq 25 mg to see if this is helpful. I have given him samples today. - 11/16/2018 Dysuria, He said he has been experiencing some dysuria. He did not leave me a urine specimen when he came and so I have asked him to leave a specimen on his way out and it will be cultured due to his symptoms. - 06/22/2018 Elevated PSA (Stable), He has a history of an elevated PSA. It has remained stable and is currently 6.21 with a free to total ratio of 22%. We did discuss today the fact that there is a possibility of the presence of prostate cancer but his PSA seems to be stable so I have recommended continued close monitoring with a PSA in 3 months and then PSA and DRE again in 6 months. - 06/22/2018 BPH w/LUTS (Stable), He has BPH by exam. He also has significant obstructive voiding symptoms that are a bother to him. He would like to consider pharmacologic management. - 2019 Nocturia, His greatest complaint is that of nocturia but he also has hesitancy, intermittency and a slow stream with some urgency. I am going to begin tamsulosin 0.4 mg. He will be reassessed in 4 weeks. We may need to increase his dosage. - 2019 Prostate nodule w/ LUTS (Stable), He has what feels like a benign prostate nodule in the left apex. It is been several years since his last PSA so that will be checked today. - 2019 Prostate nodule w/o LUTS, Prostate nodule - 2017 BPH w/o LUTS, BPH (benign prostatic hypertrophy) - 2015 Unil Inguinal Hernia W/O obst or gang,non-recurrent, Inguinal hernia, left - 2015      PMH Notes: Left oncocytoma: Dr. Alinda Money was consulted about an enhancing left renal mass after he presented with obstruction of his descending colon due to an spelnic flexure colon mass. He was taken to the OR on 03/28/17 by Dr. Georganna Skeans for resection of his colon and after his partial colectomy, it was noted that his left ureter was intimately involved with  his colon mass. Partial nephrectomy was therefore not feasible and he therefore underwent a total nephrectomy.  Pathology: oncocytoma.     NON-GU PMH: Personal history of other diseases of the nervous system and sense organs, History of hearing loss - 2015 Personal history of other endocrine, nutritional and metabolic disease, History of hyperlipidemia - 2015 Colon Cancer, History GERD    FAMILY HISTORY: Death of family member - Father, Mother    Notes: 2 sons; 1 daughter   SOCIAL HISTORY: Marital Status: Single Preferred Language: English; Ethnicity: Not Hispanic Or Latino; Race: White Current Smoking Status: Patient does not smoke anymore. Has not smoked since 02/10/2017. Smoked for 5 years.   Tobacco Use Assessment Completed: Used Tobacco in last 30 days? Does not drink anymore.  Drinks 1 caffeinated drink per day. Patient's occupation is/was retired.     Notes: Occasional alcohol use, Single, Caffeine use:r are use(occasional coffee), Former smoker   REVIEW OF SYSTEMS:  GU Review Male:   Patient denies frequent urination, hard to postpone urination, burning/ pain with urination, get up at night to urinate, leakage of urine, stream starts and stops, trouble starting your stream, have to strain to urinate , erection problems, and penile pain.  Gastrointestinal (Upper):   Patient denies nausea, vomiting, and indigestion/ heartburn.  Gastrointestinal (Lower):   Patient denies diarrhea and constipation.  Constitutional:   Patient denies fever, night sweats, weight loss, and fatigue.  Skin:   Patient denies skin rash/ lesion and itching.  Eyes:   Patient denies blurred vision and double vision.  Ears/ Nose/ Throat:   Patient denies sore throat and sinus problems.  Hematologic/Lymphatic:   Patient denies swollen glands and easy bruising.  Cardiovascular:   Patient denies leg swelling and chest pains.  Respiratory:   Patient denies cough and shortness of breath.  Endocrine:   Patient  denies excessive thirst.  Musculoskeletal:   Patient denies back pain and joint pain.  Neurological:   Patient denies headaches and dizziness.  Psychologic:   Patient denies anxiety and depression.   Notes: testicles swollen.     VITAL SIGNS:      06/19/2020 01:26 PM  Weight 188 lb / 85.28 kg  Height 69 in / 175.26 cm  BP 96/61 mmHg  Pulse 86 /min  Temperature 98.4 F / 36.8 C  BMI 27.8 kg/m   GU PHYSICAL EXAMINATION:    Anus and Perineum: No hemorrhoids. No anal stenosis. No rectal fissure, no anal fissure. No edema, no dimple, no perineal tenderness, no anal tenderness.  Scrotum: Patient has edema of the right hemiscrotum with but appears to be fluctuant abscess. No drainage noted.  Testes: Left testicle is palpably normal. Right testicle difficult to feel due to the surrounding edema. Appears to have fluctuance.  Urethral Meatus: Normal size. No lesion, no wart, no discharge, no polyp. Normal location.  Penis: Circumcised, no warts, no cracks. No dorsal Peyronie's plaques, no left corporal Peyronie's plaques, no right corporal Peyronie's plaques, no scarring, no warts. No balanitis, no meatal stenosis.  Prostate: 40 gram or 2+ size. Left lobe normal consistency, right lobe normal consistency. Symmetrical lobes. No prostate nodule. Left lobe no tenderness, right lobe no tenderness.  Seminal Vesicles: Nonpalpable.  Sphincter Tone: Normal sphincter. No rectal tenderness. No rectal mass.    MULTI-SYSTEM PHYSICAL EXAMINATION:    Constitutional: Elderly white male in no acute distress  Neck: Neck symmetrical, not swollen. Normal tracheal position.  Respiratory: No labored breathing, no use of accessory muscles.   Cardiovascular: Normal temperature, normal extremity pulses, no swelling, no varicosities.  Lymphatic: No enlargement of neck, axillae, groin.  Skin: No paleness, no jaundice, no cyanosis. No lesion, no ulcer, no rash.  Neurologic / Psychiatric: Oriented to time, oriented to  place, oriented to person. No depression, no anxiety, no agitation.  Gastrointestinal: No mass, no tenderness, no rigidity, non obese abdomen. Has colostomy left lower quadrant  Eyes: Normal conjunctivae. Normal eyelids.  Ears, Nose, Mouth, and Throat: Left ear no scars, no lesions, no masses. Right ear no scars, no lesions, no masses. Nose no scars, no lesions, no masses. Normal hearing. Normal lips.  Musculoskeletal: Normal gait and station of head and neck.     Complexity of Data:  Records Review:   Previous Patient Records  X-Ray Review: Scrotal Ultrasound: Reviewed Films. Discussed With Patient.  C.T. Abdomen: Reviewed Films.     11/16/18 09/28/18 05/19/18 02/16/18 11/05/14  PSA  Total PSA 7.26 ng/mL 6.56  ng/mL 6.21 ng/mL 6.48 ng/mL 3.93   Free PSA 1.51 ng/mL 1.50 ng/mL 1.39 ng/mL 1.50 ng/mL   % Free PSA 21 % PSA 23 % PSA 22 % PSA 23 % PSA     PROCEDURES:         Scrotal Ultrasound - 08811  Right Testicle: Length: 4.2 cm  Height: 2.8 cm  Width: 3.4 cm  Left Testicle: Length: 3.6 cm  Height: 2.2 cm  Width: 2.8 cm  Scrotal Wall:  Thickened. Edematous  Left Testis/Epididymis:  Left testicle slightly hypervascular, otherwise within normal limits. Epididymis within normal limits. Small hydrocele.  Right Testis/Epididymis:  Multiple hypoechoic masses c/w intratesticular abscess. Largest hypoechoic area 2.4 cm. No bloodflow seen within testicle. Epididymis enlarged and heterogeneous.      Patient confirmed No Neulasta OnPro Device.           Urinalysis w/Scope Dipstick Dipstick Cont'd Micro  Color: Amber Bilirubin: Neg mg/dL WBC/hpf: 20 - 40/hpf  Appearance: Cloudy Ketones: Neg mg/dL RBC/hpf: 0 - 2/hpf  Specific Gravity: 1.020 Blood: Neg ery/uL Bacteria: Few (10-25/hpf)  pH: 5.5 Protein: 1+ mg/dL Cystals: Amorph Urates  Glucose: Neg mg/dL Urobilinogen: 0.2 mg/dL Casts: Hyaline    Nitrites: Neg Trichomonas: Not Present    Leukocyte Esterase: 1+ leu/uL Mucous: Not Present       Epithelial Cells: 6 - 10/hpf      Yeast: NS (Not Seen)      Sperm: Not Present    ASSESSMENT:      ICD-10 Details  1 GU:   Abscess of epididymis / testis - N45.4 Undiagnosed New Problem  2   Epididymo-orchitis - S31.5 Acute, Complicated Injury  3 NON-GU:   Colon Cancer, History - Z85.038 Chronic, Worsening   PLAN:           Orders X-Rays: Scrotal Ultrasound          Schedule         Document Letter(s):  Created for Patient: Clinical Summary         Notes:   I discussed physical exam findings along with a testicular ultrasound findings with the patient and son today. Recommended scrotal exploration and likely will need simple orchiectomy for testicular abscess. Risks and benefits of the procedure were discussed in detail. Will schedule accordingly a later today.        Next Appointment:      Next Appointment: 07/11/2020 10:45 AM    Appointment Type: 30 Minute Office Visit Established Pt    Location: [facility] [phone]    Provider: Harold Barban, M.D.    Reason for Visit: 2 wks ov      Signed by Harold Barban, M.D. on 06/19/20 at 3:22 PM (EDT)

## 2020-06-20 ENCOUNTER — Encounter (HOSPITAL_COMMUNITY): Payer: Self-pay | Admitting: Urology

## 2020-06-20 ENCOUNTER — Ambulatory Visit (HOSPITAL_COMMUNITY): Payer: Medicare Other

## 2020-06-20 DIAGNOSIS — N3941 Urge incontinence: Secondary | ICD-10-CM | POA: Diagnosis present

## 2020-06-20 DIAGNOSIS — D630 Anemia in neoplastic disease: Secondary | ICD-10-CM | POA: Diagnosis present

## 2020-06-20 DIAGNOSIS — K219 Gastro-esophageal reflux disease without esophagitis: Secondary | ICD-10-CM | POA: Diagnosis present

## 2020-06-20 DIAGNOSIS — Z20822 Contact with and (suspected) exposure to covid-19: Secondary | ICD-10-CM | POA: Diagnosis present

## 2020-06-20 DIAGNOSIS — N493 Fournier gangrene: Secondary | ICD-10-CM | POA: Diagnosis present

## 2020-06-20 DIAGNOSIS — N492 Inflammatory disorders of scrotum: Secondary | ICD-10-CM | POA: Diagnosis present

## 2020-06-20 DIAGNOSIS — Z87891 Personal history of nicotine dependence: Secondary | ICD-10-CM | POA: Diagnosis not present

## 2020-06-20 DIAGNOSIS — Z515 Encounter for palliative care: Secondary | ICD-10-CM | POA: Diagnosis present

## 2020-06-20 DIAGNOSIS — N179 Acute kidney failure, unspecified: Secondary | ICD-10-CM | POA: Diagnosis present

## 2020-06-20 DIAGNOSIS — Z85528 Personal history of other malignant neoplasm of kidney: Secondary | ICD-10-CM | POA: Diagnosis not present

## 2020-06-20 DIAGNOSIS — Z933 Colostomy status: Secondary | ICD-10-CM | POA: Diagnosis not present

## 2020-06-20 DIAGNOSIS — N453 Epididymo-orchitis: Secondary | ICD-10-CM | POA: Diagnosis present

## 2020-06-20 DIAGNOSIS — E861 Hypovolemia: Secondary | ICD-10-CM | POA: Diagnosis present

## 2020-06-20 DIAGNOSIS — E872 Acidosis: Secondary | ICD-10-CM | POA: Diagnosis present

## 2020-06-20 DIAGNOSIS — N454 Abscess of epididymis or testis: Secondary | ICD-10-CM | POA: Diagnosis present

## 2020-06-20 DIAGNOSIS — N1831 Chronic kidney disease, stage 3a: Secondary | ICD-10-CM | POA: Diagnosis present

## 2020-06-20 DIAGNOSIS — C189 Malignant neoplasm of colon, unspecified: Secondary | ICD-10-CM | POA: Diagnosis present

## 2020-06-20 DIAGNOSIS — N400 Enlarged prostate without lower urinary tract symptoms: Secondary | ICD-10-CM | POA: Diagnosis present

## 2020-06-20 DIAGNOSIS — R3915 Urgency of urination: Secondary | ICD-10-CM | POA: Diagnosis present

## 2020-06-20 DIAGNOSIS — Z9049 Acquired absence of other specified parts of digestive tract: Secondary | ICD-10-CM | POA: Diagnosis not present

## 2020-06-20 DIAGNOSIS — D638 Anemia in other chronic diseases classified elsewhere: Secondary | ICD-10-CM | POA: Diagnosis present

## 2020-06-20 DIAGNOSIS — E871 Hypo-osmolality and hyponatremia: Secondary | ICD-10-CM | POA: Diagnosis present

## 2020-06-20 DIAGNOSIS — D62 Acute posthemorrhagic anemia: Secondary | ICD-10-CM | POA: Diagnosis not present

## 2020-06-20 LAB — HEMOGLOBIN AND HEMATOCRIT, BLOOD
HCT: 22.5 % — ABNORMAL LOW (ref 39.0–52.0)
Hemoglobin: 7.2 g/dL — ABNORMAL LOW (ref 13.0–17.0)

## 2020-06-20 LAB — BASIC METABOLIC PANEL
Anion gap: 10 (ref 5–15)
BUN: 34 mg/dL — ABNORMAL HIGH (ref 8–23)
CO2: 18 mmol/L — ABNORMAL LOW (ref 22–32)
Calcium: 8.5 mg/dL — ABNORMAL LOW (ref 8.9–10.3)
Chloride: 102 mmol/L (ref 98–111)
Creatinine, Ser: 2.02 mg/dL — ABNORMAL HIGH (ref 0.61–1.24)
GFR calc Af Amer: 35 mL/min — ABNORMAL LOW (ref >60)
GFR calc non Af Amer: 30 mL/min — ABNORMAL LOW (ref >60)
Glucose, Bld: 103 mg/dL — ABNORMAL HIGH (ref 70–99)
Potassium: 3.9 mmol/L (ref 3.5–5.1)
Sodium: 130 mmol/L — ABNORMAL LOW (ref 135–145)

## 2020-06-20 LAB — CBC
HCT: 18.8 % — ABNORMAL LOW (ref 39.0–52.0)
Hemoglobin: 6 g/dL — CL (ref 13.0–17.0)
MCH: 25.8 pg — ABNORMAL LOW (ref 26.0–34.0)
MCHC: 31.9 g/dL (ref 30.0–36.0)
MCV: 80.7 fL (ref 80.0–100.0)
Platelets: 301 10*3/uL (ref 150–400)
RBC: 2.33 MIL/uL — ABNORMAL LOW (ref 4.22–5.81)
RDW: 17 % — ABNORMAL HIGH (ref 11.5–15.5)
WBC: 12.8 10*3/uL — ABNORMAL HIGH (ref 4.0–10.5)
nRBC: 0 % (ref 0.0–0.2)

## 2020-06-20 LAB — PREPARE RBC (CROSSMATCH)

## 2020-06-20 MED ORDER — HYDROMORPHONE HCL 1 MG/ML IJ SOLN: 0.5000 mg | INTRAMUSCULAR | Status: DC | PRN

## 2020-06-20 MED ORDER — POLYVINYL ALCOHOL 1.4 % OP SOLN
1.0000 [drp] | OPHTHALMIC | Status: DC | PRN
  Administered 2020-06-20: 1 [drp] via OPHTHALMIC
  Filled 2020-06-20: qty 15

## 2020-06-20 MED ORDER — HYDROMORPHONE HCL 2 MG PO TABS: 2.0000 mg | ORAL_TABLET | ORAL | Status: DC | PRN

## 2020-06-20 MED ORDER — POLYETHYLENE GLYCOL 3350 17 G PO PACK: 1.0000 | PACK | Freq: Every day | ORAL | Status: DC | PRN

## 2020-06-20 MED ORDER — ACETAMINOPHEN 325 MG PO TABS
650.0000 mg | ORAL_TABLET | Freq: Four times a day (QID) | ORAL | Status: DC | PRN
  Filled 2020-06-20: qty 2

## 2020-06-20 MED ORDER — ACETAMINOPHEN 500 MG PO TABS: 1000.0000 mg | ORAL_TABLET | Freq: Three times a day (TID) | ORAL | Status: DC | PRN

## 2020-06-20 MED ORDER — TAMSULOSIN HCL 0.4 MG PO CAPS
0.8000 mg | ORAL_CAPSULE | Freq: Every day | ORAL | Status: DC
  Administered 2020-06-20 – 2020-06-21 (×2): 0.8 mg via ORAL
  Filled 2020-06-20: qty 2

## 2020-06-20 MED ORDER — MAGNESIUM CITRATE PO SOLN: 0.5000 | ORAL | Status: DC | PRN

## 2020-06-20 MED ORDER — DOXYCYCLINE HYCLATE 100 MG PO TABS: 100.0000 mg | ORAL_TABLET | Freq: Two times a day (BID) | ORAL | Status: DC

## 2020-06-20 MED ORDER — CHLORHEXIDINE GLUCONATE CLOTH 2 % EX PADS
6.0000 | MEDICATED_PAD | Freq: Every day | CUTANEOUS | Status: DC
  Administered 2020-06-20 – 2020-06-21 (×2): 6 via TOPICAL

## 2020-06-20 MED ORDER — DOCUSATE SODIUM 100 MG PO CAPS
100.0000 mg | ORAL_CAPSULE | Freq: Two times a day (BID) | ORAL | Status: DC | PRN
  Administered 2020-06-22: 100 mg via ORAL
  Filled 2020-06-20: qty 1

## 2020-06-20 MED ORDER — ONDANSETRON HCL 4 MG PO TABS: 4.0000 mg | ORAL_TABLET | Freq: Three times a day (TID) | ORAL | Status: DC | PRN

## 2020-06-20 MED ORDER — BISACODYL 5 MG PO TBEC: 5.0000 mg | DELAYED_RELEASE_TABLET | Freq: Every day | ORAL | Status: DC | PRN

## 2020-06-20 MED ORDER — CHOLESTYRAMINE LIGHT 4 G PO PACK
4.0000 g | PACK | ORAL | Status: DC
  Filled 2020-06-20 (×2): qty 1

## 2020-06-20 MED ORDER — SODIUM CHLORIDE 0.9% FLUSH
3.0000 mL | Freq: Two times a day (BID) | INTRAVENOUS | Status: DC
  Administered 2020-06-20 (×2): 3 mL via INTRAVENOUS

## 2020-06-20 MED ORDER — LATANOPROST 0.005 % OP SOLN
1.0000 [drp] | Freq: Every day | OPHTHALMIC | Status: DC
  Administered 2020-06-21: 1 [drp] via OPHTHALMIC
  Filled 2020-06-20: qty 2.5

## 2020-06-20 MED ORDER — HEPARIN SODIUM (PORCINE) 5000 UNIT/ML IJ SOLN
5000.0000 [IU] | Freq: Three times a day (TID) | INTRAMUSCULAR | Status: DC
  Administered 2020-06-20: 5000 [IU] via SUBCUTANEOUS
  Filled 2020-06-20: qty 1

## 2020-06-20 MED ORDER — ACETAMINOPHEN 325 MG PO TABS
650.0000 mg | ORAL_TABLET | Freq: Four times a day (QID) | ORAL | Status: DC | PRN
  Administered 2020-06-20 – 2020-06-21 (×6): 650 mg via ORAL
  Filled 2020-06-20 (×6): qty 2

## 2020-06-20 MED ORDER — SODIUM CHLORIDE 0.9% IV SOLUTION: Freq: Once | INTRAVENOUS | Status: AC

## 2020-06-20 NOTE — Progress Notes (Signed)
PROGRESS NOTE  Shawn Chambers  VZC:[id number] DOB: [birth year] DOA: 06/19/2020 PCP: [name]  Outpatient Specialists: Urology, Dr. Milford Cage Brief Narrative: Shawn Chambers is an 79 y.o. male hospice patient with a history of colorectal cancer s/p colectomy and colostomy 2018, BPH, GERD, and SBO s/p venting PEG who presented to urology office for scrotal swelling and pain despite taking oral antibiotics found to have cellulitis and abscess on U/S and admitted 7/8 for debridement. Per urology, involvement appeared like early Fournier's gangrene, for which antibiotics were broadened to vancomycin, clindamycin, and zosyn. IV fluids were also administered for AKI and postoperative anemia to 6.0 g/dl was treated with 1u PRBCs.   Assessment & Plan: Active Problems:   Testicular abscess   Fournier's gangrene of scrotum  Fournier's gangrene: Per report from urology s/p right orchiectomy, scrotum exploration and debridement 7/8.  - Follow up intraoperative cultures. Gram stain showing GPC in pairs, gram variable rods.  - Continue pain control prn. Tylenol only has been used.   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 Alternatively, right hydronephrosis noted on previous CT may have advanced, though don't believe any intervention would be warranted in this hospice patients.  - some improvement, no evidence of overload, so will continue LR. Acidosis improved slightly.   Colon cancer: No further interventions planned.  - Continue home hospice care.   History of SBO with venting gastrostomy: - Continue periodic venting here - Aggressive bowel regimen continued.   BPH:  - Continue flomax  Anemia of chronic disease and acute postoperative anemia: Likely both due to malignancy and CKD. Low EBL anticipated, though with IVF, hgb dropped below 7g/dl. 2u PRBCs ordered, though patient only consents to 1u. Denies symptoms of anemia at this  time.  - Will check CBC in AM as he agrees to transfusion if hgb severely low.   Hyponatremia:  - Continue LR.   DVT prophylaxis: SCDs with anemia. Frequent ambulation. Code Status: Full during this hospitalization.  Family Communication: none at bedside Disposition Plan:  Status is: Inpatient  Remains inpatient appropriate because:Inpatient level of care appropriate due to severity of illness  Dispo: The patient is from: Home              Anticipated d/c is to: Home              Anticipated d/c date is: 06/21/2020 as the patient barely agreed to staying overnight tonight and is unlikely to remain inpatient longer than that.              Patient currently is not medically stable to d/c.  Consultants:   Urology  Procedures:  06/19/20 SCROTUM EXPLORATION, RIGHT SIMPLE ORCHIECTOMY Remi Haggard, MD   Antimicrobials:  Vancomycin, clindamycin, zosyn 7/8 >>  Ceftriaxone, doxycycline 7/8   Subjective: Pain in scrotum is moderate, improved with tylenol. Repacking not yet done. Does not feel ill or chest pain or dyspnea or lightheaded or palpitations. Wants to go home. Resistant to inserting IV's for antibiotics and for continuing transfusions.  Objective: Vitals:   06/20/20 0845 06/20/20 0945 06/20/20 1230 06/20/20 1500  BP: (!) 106/57 (!) 92/53 (!) 103/56   Pulse: 77 77 68   Resp: 18 17 18 18   Temp: 97.7 F (36.5 C) 98.2 F (36.8 C) (!) 97.5 F (36.4 C) 98.4 F (36.9 C)  TempSrc: Oral Axillary Oral Oral  SpO2: 100% 100% 100% 100%  Weight:      Height:  Intake/Output Summary (Last 24 hours) at 06/20/2020 1532 Last data filed at 06/20/2020 1230 Gross per 24 hour  Intake 1421.14 ml  Output 1310 ml  Net 111.14 ml   Filed Weights   06/19/20 1614  Weight: 69.5 kg    Gen: 79 y.o. male in no distress Pulm: Non-labored breathing room air. Clear to auscultation bilaterally.  CV: Regular rate and rhythm. No murmur, rub, or gallop. No JVD, no pedal edema. GI:  Abdomen soft, non-tender, non-distended, with normoactive bowel sounds. No organomegaly or masses felt. Ext: Warm, no deformities Skin: Packing in right scrotum with decreased edema and erythema. Stable regional lymphadenopathy. No other ulcers Neuro: Alert and oriented. No focal neurological deficits. Psych: Judgement and insight appear fair. Mood & affect appropriate.   Data Reviewed: I have personally reviewed following labs and imaging studies  CBC: Recent Labs  Lab 06/19/20 1600 06/20/20 0507  WBC 17.5* 12.8*  HGB 7.7* 6.0*  HCT 25.8* 18.8*  MCV 85.7 80.7  PLT 401* 259   Basic Metabolic Panel: Recent Labs  Lab 06/19/20 1600 06/20/20 0507  NA 131* 130*  K 3.8 3.9  CL 101 102  CO2 17* 18*  GLUCOSE 131* 103*  BUN 35* 34*  CREATININE 2.13* 2.02*  CALCIUM 9.0 8.5*   GFR: Estimated Creatinine Clearance: 28.7 mL/min (A) (by C-G formula based on SCr of 2.02 mg/dL (H)). Liver Function Tests: Recent Labs  Lab 06/19/20 1600  AST 19  ALT 27  ALKPHOS 149*  BILITOT 0.5  PROT 7.4  ALBUMIN 2.7*   No results for input(s): LIPASE, AMYLASE in the last 168 hours. No results for input(s): AMMONIA in the last 168 hours. Coagulation Profile: No results for input(s): INR, PROTIME in the last 168 hours. Cardiac Enzymes: No results for input(s): CKTOTAL, CKMB, CKMBINDEX, TROPONINI in the last 168 hours. BNP (last 3 results) No results for input(s): PROBNP in the last 8760 hours. HbA1C: No results for input(s): HGBA1C in the last 72 hours. CBG: No results for input(s): GLUCAP in the last 168 hours. Lipid Profile: No results for input(s): CHOL, HDL, LDLCALC, TRIG, CHOLHDL, LDLDIRECT in the last 72 hours. Thyroid Function Tests: No results for input(s): TSH, T4TOTAL, FREET4, T3FREE, THYROIDAB in the last 72 hours. Anemia Panel: No results for input(s): VITAMINB12, FOLATE, FERRITIN, TIBC, IRON, RETICCTPCT in the last 72 hours. Urine analysis:    Component Value Date/Time    COLORURINE STRAW (A) 11/22/2019 0919   APPEARANCEUR CLEAR 11/22/2019 0919   LABSPEC 1.008 11/22/2019 0919   PHURINE 6.0 11/22/2019 0919   GLUCOSEU NEGATIVE 11/22/2019 0919   HGBUR NEGATIVE 11/22/2019 0919   BILIRUBINUR NEGATIVE 11/22/2019 0919   KETONESUR NEGATIVE 11/22/2019 0919   PROTEINUR NEGATIVE 11/22/2019 0919   NITRITE NEGATIVE 11/22/2019 0919   LEUKOCYTESUR NEGATIVE 11/22/2019 0919   Recent Results (from the past 240 hour(s))  SARS Coronavirus 2 by RT PCR (hospital order, performed in [facility] hospital lab) Nasopharyngeal Nasopharyngeal Swab     Status: None   Collection Time: 06/19/20  3:23 PM   Specimen: Nasopharyngeal Swab  Result Value Ref Range Status   SARS Coronavirus 2 NEGATIVE NEGATIVE Final    Comment: (NOTE) SARS-CoV-2 target nucleic acids are NOT DETECTED.  The SARS-CoV-2 RNA is generally detectable in upper and lower respiratory specimens during the acute phase of infection. The lowest concentration of SARS-CoV-2 viral copies this assay can detect is 250 copies / mL. A negative result does not preclude SARS-CoV-2 infection and should not be used as the  sole basis for treatment or other patient management decisions.  A negative result may occur with improper specimen collection / handling, submission of specimen other than nasopharyngeal swab, presence of viral mutation(s) within the areas targeted by this assay, and inadequate number of viral copies (<250 copies / mL). A negative result must be combined with clinical observations, patient history, and epidemiological information.  Fact Sheet for Patients:   StrictlyIdeas.no  Fact Sheet for Healthcare Providers: BankingDealers.co.za  This test is not yet approved or  cleared by the [place] FDA and has been authorized for detection and/or diagnosis of SARS-CoV-2 by FDA under an Emergency Use Authorization (EUA).  This EUA will remain in effect (meaning  this test can be used) for the duration of the COVID-19 declaration under Section 564(b)(1) of the Act, 21 U.S.C. section 360bbb-3(b)(1), unless the authorization is terminated or revoked sooner.  Performed at [facility], [place] [address] [place], [place] [address]   Aerobic/Anaerobic Culture (surgical/deep wound)     Status: None (Preliminary result)   Collection Time: 06/19/20  7:22 PM   Specimen: Abscess  Result Value Ref Range Status   Specimen Description   Final    ABSCESS TESTICULAR Performed at [facility] [address], [place], [place] [address]    Special Requests   Final    NONE Performed at [facility], [place] [address], [place], [place] [address]    Gram Stain   Final    RARE WBC PRESENT, PREDOMINANTLY MONONUCLEAR MODERATE GRAM POSITIVE COCCI IN PAIRS MODERATE GRAM VARIABLE ROD    Culture   Final    NO GROWTH < 12 HOURS Performed at [facility], [place] [address], [place], [place] [address]    Report Status PENDING  Incomplete      Radiology Studies: No results found.  Scheduled Meds: . Chlorhexidine Gluconate Cloth  6 each Topical Daily  . cholestyramine  4 g Oral Q24H  . latanoprost  1 drop Both Eyes QHS  . sodium chloride flush  3 mL Intravenous Q12H  . tamsulosin  0.8 mg Oral QHS   Continuous Infusions: . clindamycin (CLEOCIN) IV 900 mg (06/20/20 1237)  . lactated ringers 75 mL/hr at 06/19/20 1847  . piperacillin-tazobactam (ZOSYN)  IV 3.375 g (06/20/20 1410)  . vancomycin       LOS: 1 day   Time spent: 35 minutes.  Patrecia Pour, MD [facility] [email or web address] 06/20/2020, 3:32 PM

## 2020-06-20 NOTE — Progress Notes (Addendum)
RN explained to patient that a second IV site was needed to administer his IV antibiotics (unit of RBCs is currently infusing in current IV). Pt refused to have a second IV site placed. MD notified. Will continue to monitor.   Pt also refused to receive a second unit of blood. MD made aware. No new orders at this time.

## 2020-06-20 NOTE — Progress Notes (Signed)
Colostomy bag changed and stoma cleaned.

## 2020-06-20 NOTE — TOC Initial Note (Signed)
Transition of Care [facility]) - Initial/Assessment Note    Patient Details  Name: Shawn Chambers MRN: [id number] Date of Birth: Jul 04, 1941  Transition of Care [facility]) CM/SW Contact:    Shade Flood, LCSW Phone Number: 06/20/2020, 10:42 AM  Clinical Narrative:                  Pt admitted from home. He is active with [facility] [place]. Spoke with Cheri from Hospice who states this is a hospice related admission and they will resume care for pt at home upon dc. Cheri states pt cannot be on long term IV anbx at dc but if he discharges on oral anbx, that is acceptable.  TOC will follow and assist as needed.  Expected Discharge Plan: Home w Hospice Care Barriers to Discharge: Continued Medical Work up   Patient Goals and CMS Choice        Expected Discharge Plan and Services Expected Discharge Plan: Home w Hospice Care In-house Referral: Clinical Social Work   Post Acute Care Choice: Resumption of Svcs/PTA Provider Living arrangements for the past 2 months: Single Family Home                                      Prior Living Arrangements/Services Living arrangements for the past 2 months: Single Family Home Lives with:: Self Patient language and need for interpreter reviewed:: Yes Do you feel safe going back to the place where you live?: Yes            Criminal Activity/Legal Involvement Pertinent to Current Situation/Hospitalization: No - Comment as needed  Activities of Daily Living Home Assistive Devices/Equipment: Environmental consultant (specify type) ADL Screening (condition at time of admission) Patient's cognitive ability adequate to safely complete daily activities?: Yes Is the patient deaf or have difficulty hearing?: Yes Does the patient have difficulty seeing, even when wearing glasses/contacts?: No Does the patient have difficulty concentrating, remembering, or making decisions?: No Patient able to express need for assistance with ADLs?: Yes Does the patient have  difficulty dressing or bathing?: No Does the patient have difficulty walking or climbing stairs?: Yes Weakness of Legs: Both Weakness of Arms/Hands: Both  Permission Sought/Granted                  Emotional Assessment       Orientation: : Oriented to Self, Oriented to Place, Oriented to  Time, Oriented to Situation Alcohol / Substance Use: Not Applicable Psych Involvement: No (comment)  Admission diagnosis:  Testicular abscess [N45.4] 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 Testicular abscess 06/19/2020  . Small bowel obstruction ([place]) 04/22/2020  . Antineoplastic chemotherapy regimen declined 04/22/2020  . Constipation, chronic 04/22/2020  . Abdominal pain 04/22/2020  . Goals of care, counseling/discussion 04/01/2020  . Hyperglycemia 10/21/2019  . History of Noncompliance with treatment plan 10/20/2019  . 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 Anemia of chronic disease 10/20/2019  . HOH (hard of hearing) 10/20/2019  . History of seizures   . Glaucoma   . BPH (benign prostatic hyperplasia)   . 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 Metastatic Rectal cancer pT4pN1a  10/18/2019  . 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 GERD (gastroesophageal reflux disease) 03/28/2017  . Smoking greater  than 40 pack years 03/28/2017  . 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 [place] [address] Phone: [phone] Fax: [place], [place] [address] Phone: [phone] Fax: [phone]     Social Determinants of Health ([place]) Interventions    Readmission Risk Interventions Readmission Risk Prevention Plan 06/20/2020 04/24/2020  Transportation Screening Complete Complete  PCP or Specialist Appt within 3-5 Days - Complete  Home Care Screening Not Complete -  Home Care Screening Not Completed Comments Pt active with hospice -  Medication Review (RN CM) Complete -  HRI or [place] - Complete  Social Work Consult for [place] Planning/Counseling - Complete  Palliative Care Screening - Complete  Medication Review Press photographer) - Complete  Some recent data might be hidden

## 2020-06-20 NOTE — Progress Notes (Signed)
1 Day Post-Op Subjective: Patient reports pain control good.  Objective: Vital signs in last 24 hours: Temp:  [97.5 F (36.4 C)-99.1 F (37.3 C)] 98.4 F (36.9 C) (07/09 1500) Pulse Rate:  [66-101] 66 (07/09 1500) Resp:  [15-20] 18 (07/09 1500) BP: (91-137)/(51-70) 102/53 (07/09 1500) SpO2:  [99 %-100 %] 100 % (07/09 1500)  Intake/Output from previous day: 07/08 0701 - 07/09 0700 In: 941.1 [P.O.:240; I.V.:150; IV Piggyback:551.1] Out: 660 [Urine:650; Blood:10] Intake/Output this shift: Total I/O In: 1659.4 [P.O.:360; I.V.:722.3; Blood:480; IV Piggyback:97.1] Out: 1025 [Urine:1025]  Physical Exam:  General:alert, cooperative and no distress GI: not done  Right hemiscrotum with open wound, appears clean, no bleeding or evidence of exudate or discharge.  Lab Results: Recent Labs    06/19/20 1600 06/20/20 0507 06/20/20 1634  HGB 7.7* 6.0* 7.2*  HCT 25.8* 18.8* 22.5*   BMET Recent Labs    06/19/20 1600 06/20/20 0507  NA 131* 130*  K 3.8 3.9  CL 101 102  CO2 17* 18*  GLUCOSE 131* 103*  BUN 35* 34*  CREATININE 2.13* 2.02*  CALCIUM 9.0 8.5*   No results for input(s): LABPT, INR in the last 72 hours. No results for input(s): LABURIN in the last 72 hours. Results for orders placed or performed during the hospital encounter of 06/19/20  SARS Coronavirus 2 by RT PCR (hospital order, performed in [facility] hospital lab) Nasopharyngeal Nasopharyngeal Swab     Status: None   Collection Time: 06/19/20  3:23 PM   Specimen: Nasopharyngeal Swab  Result Value Ref Range Status   SARS Coronavirus 2 NEGATIVE NEGATIVE Final    Comment: (NOTE) SARS-CoV-2 target nucleic acids are NOT DETECTED.  The SARS-CoV-2 RNA is generally detectable in upper and lower respiratory specimens during the acute phase of infection. The lowest concentration of SARS-CoV-2 viral copies this assay can detect is 250 copies / mL. A negative result does not preclude SARS-CoV-2 infection and should  not be used as the sole basis for treatment or other patient management decisions.  A negative result may occur with improper specimen collection / handling, submission of specimen other than nasopharyngeal swab, presence of viral mutation(s) within the areas targeted by this assay, and inadequate number of viral copies (<250 copies / mL). A negative result must be combined with clinical observations, patient history, and epidemiological information.  Fact Sheet for Patients:   StrictlyIdeas.no  Fact Sheet for Healthcare Providers: BankingDealers.co.za  This test is not yet approved or  cleared by the [place] FDA and has been authorized for detection and/or diagnosis of SARS-CoV-2 by FDA under an Emergency Use Authorization (EUA).  This EUA will remain in effect (meaning this test can be used) for the duration of the COVID-19 declaration under Section 564(b)(1) of the Act, 21 U.S.C. section 360bbb-3(b)(1), unless the authorization is terminated or revoked sooner.  Performed at [facility], [place] [address], [place], [place] [address]   Aerobic/Anaerobic Culture (surgical/deep wound)     Status: None (Preliminary result)   Collection Time: 06/19/20  7:22 PM   Specimen: Abscess  Result Value Ref Range Status   Specimen Description   Final    ABSCESS TESTICULAR Performed at [place] [address], [place], [place] [address]    Special Requests   Final    NONE Performed at [facility], [place] [address] [place], [place] [address]    Gram Stain   Final    RARE WBC PRESENT, PREDOMINANTLY MONONUCLEAR  MODERATE GRAM POSITIVE COCCI IN PAIRS MODERATE GRAM VARIABLE ROD    Culture   Final    NO GROWTH < 12 HOURS Performed at [facility], [place] [address] [place], [place] [address]    Report Status PENDING  Incomplete    Studies/Results: No results  found.  Assessment/Plan: Continue foley due to urinary output monitoring  Status post right simple orchiectomy for abscess.  Await final cultures adjust antibiotics appropriately.  Continue b.i.d. normal saline wet-to-dry dressing changes.  Hopeful DC home with either hospice home health or family members helping with dressing changes twice daily once we have antibiotics sorted out based on culture   LOS: 1 day   Shawn Chambers 06/20/2020, 5:35 PM

## 2020-06-21 LAB — CBC
HCT: 23.5 % — ABNORMAL LOW (ref 39.0–52.0)
Hemoglobin: 7.6 g/dL — ABNORMAL LOW (ref 13.0–17.0)
MCH: 26.3 pg (ref 26.0–34.0)
MCHC: 32.3 g/dL (ref 30.0–36.0)
MCV: 81.3 fL (ref 80.0–100.0)
Platelets: 324 10*3/uL (ref 150–400)
RBC: 2.89 MIL/uL — ABNORMAL LOW (ref 4.22–5.81)
RDW: 17.1 % — ABNORMAL HIGH (ref 11.5–15.5)
WBC: 11.4 10*3/uL — ABNORMAL HIGH (ref 4.0–10.5)
nRBC: 0 % (ref 0.0–0.2)

## 2020-06-21 LAB — COMPREHENSIVE METABOLIC PANEL
ALT: 18 U/L (ref 0–44)
AST: 13 U/L — ABNORMAL LOW (ref 15–41)
Albumin: 2 g/dL — ABNORMAL LOW (ref 3.5–5.0)
Alkaline Phosphatase: 128 U/L — ABNORMAL HIGH (ref 38–126)
Anion gap: 11 (ref 5–15)
BUN: 30 mg/dL — ABNORMAL HIGH (ref 8–23)
CO2: 17 mmol/L — ABNORMAL LOW (ref 22–32)
Calcium: 8.4 mg/dL — ABNORMAL LOW (ref 8.9–10.3)
Chloride: 104 mmol/L (ref 98–111)
Creatinine, Ser: 1.89 mg/dL — ABNORMAL HIGH (ref 0.61–1.24)
GFR calc Af Amer: 38 mL/min — ABNORMAL LOW (ref >60)
GFR calc non Af Amer: 33 mL/min — ABNORMAL LOW (ref >60)
Glucose, Bld: 130 mg/dL — ABNORMAL HIGH (ref 70–99)
Potassium: 4 mmol/L (ref 3.5–5.1)
Sodium: 132 mmol/L — ABNORMAL LOW (ref 135–145)
Total Bilirubin: 0.7 mg/dL (ref 0.3–1.2)
Total Protein: 5.8 g/dL — ABNORMAL LOW (ref 6.5–8.1)

## 2020-06-21 NOTE — Progress Notes (Signed)
PROGRESS NOTE  Shawn Chambers  KAJ:[id number] DOB: [birth year] DOA: 06/19/2020 PCP: [facility]  Outpatient Specialists: Urology, Dr. Milford Chambers Brief Narrative: Shawn Chambers is an 79 y.o. male hospice patient with a history of colorectal cancer s/p colectomy and colostomy 2018, BPH, GERD, and SBO s/p venting PEG who presented to urology office for scrotal swelling and pain despite taking oral antibiotics found to have cellulitis and abscess on U/S and admitted 7/8 for debridement. Per urology, involvement appeared like early Fournier's gangrene, for which antibiotics were broadened to vancomycin, clindamycin, and zosyn. IV fluids were also administered for AKI and postoperative anemia to 6.0 g/dl was treated with 1u PRBCs with improvement. Broad antibiotics are continued pending culture data.   Assessment & Plan: Active Problems:   Testicular abscess   Fournier's gangrene of scrotum  Fournier's gangrene: Per report from urology s/p right orchiectomy, scrotum exploration and debridement 7/8.  - Follow up intraoperative cultures, reincubated. Gram stain showing GPC in pairs, gram variable rods. Leukocytosis improved, remains elevated. - Will require daily W > D packing changes after DC. CM aware and checking to see if that will be provided by hospice care. Family is reluctant to do this. - Continue pain control prn with tylenol - Defer timing of foley removal to urology.  AKI on stage IIIa CKD with NAGMA and hypovolemic hyponatremia: Due to infection, cipro and bactrim, frequent NSAIDs. Alternatively, right hydronephrosis noted on previous CT may have advanced, though don't believe any intervention would be warranted in this hospice patients.  - Continues to have improvement, no evidence of overload, so will continue LR again today with need for vancomycin and limited po intake.    Colon cancer: No further interventions planned.  - Continue home hospice care.   History of SBO  with venting gastrostomy: - Continue periodic venting here - Aggressive bowel regimen continued.   BPH:  - Continue flomax  Anemia of chronic disease and acute postoperative anemia: Likely both due to malignancy and CKD. s/p 1u PRBCs 7/9 with improvement in hgb.  - Monitor  Thrombocytosis: Reactive to infection, resolved.  Hyponatremia:  - Continue LR.   DVT prophylaxis: SCDs with anemia in hospice patient. Frequent ambulation. Code Status: Full during this hospitalization.  Family Communication: None at bedside Disposition Plan:  Status is: Inpatient  Remains inpatient appropriate because:Inpatient level of care appropriate due to severity of illness  Dispo: The patient is from: Home              Anticipated d/c is to: Home              Anticipated d/c date is: 06/22/2020 depending on ability to secure reliable wound care at home, and culture results to drive antibiotic decision.               Patient currently is not medically stable to d/c.  Consultants:   Urology  Procedures:  06/19/20 SCROTUM EXPLORATION, RIGHT SIMPLE ORCHIECTOMY Shawn Haggard, MD   Antimicrobials:  Vancomycin, clindamycin, zosyn 7/8 >>  Ceftriaxone, doxycycline 7/8   Subjective: Pain is manageable, overall improved. No fever. Wants to get up and walk around which was encouraged. No other complaints.   Objective: Vitals:   06/20/20 1230 06/20/20 1500 06/21/20 0558 06/21/20 1408  BP: (!) 103/56 (!) 102/53 (!) 108/58 116/68  Pulse: 68 66 (!) 57 64  Resp: 18 18 20 17   Temp: (!) 97.5 F (36.4 C) 98.4 F (36.9 C) 97.9 F (36.6 C) 97.6 F (36.4  C)  TempSrc: Oral Oral  Oral  SpO2: 100% 100% 100% 99%  Weight:      Height:        Intake/Output Summary (Last 24 hours) at 06/21/2020 1511 Last data filed at 06/21/2020 1310 Gross per 24 hour  Intake 1598.14 ml  Output 2125 ml  Net -526.86 ml   Filed Weights   06/19/20 1614  Weight: 69.5 kg    Gen: 79 y.o. male in no distress Pulm:  Nonlabored breathing room air. Clear. CV: Regular rate and rhythm. No murmur, rub, or gallop. No JVD, no dependent edema. GI: Abdomen soft, non-tender, non-distended, with normoactive bowel sounds.  GU: Foley in place. Right scrotum with open wound, packed, hemostatic. Overall significant reduction in edema. Ext: Warm, no deformities Skin: No new rashes, lesions or ulcers on visualized skin. Neuro: Alert and oriented. No focal neurological deficits. Psych: Judgement and insight appear fair. Mood euthymic & affect congruent. Behavior is appropriate.     Data Reviewed: I have personally reviewed following labs and imaging studies  CBC: Recent Labs  Lab 06/19/20 1600 06/20/20 0507 06/20/20 1634 06/21/20 0536  WBC 17.5* 12.8*  --  11.4*  HGB 7.7* 6.0* 7.2* 7.6*  HCT 25.8* 18.8* 22.5* 23.5*  MCV 85.7 80.7  --  81.3  PLT 401* 301  --  235   Basic Metabolic Panel: Recent Labs  Lab 06/19/20 1600 06/20/20 0507 06/21/20 0536  NA 131* 130* 132*  K 3.8 3.9 4.0  CL 101 102 104  CO2 17* 18* 17*  GLUCOSE 131* 103* 130*  BUN 35* 34* 30*  CREATININE 2.13* 2.02* 1.89*  CALCIUM 9.0 8.5* 8.4*   GFR: Estimated Creatinine Clearance: 30.6 mL/min (A) (by C-G formula based on SCr of 1.89 mg/dL (H)). Liver Function Tests: Recent Labs  Lab 06/19/20 1600 06/21/20 0536  AST 19 13*  ALT 27 18  ALKPHOS 149* 128*  BILITOT 0.5 0.7  PROT 7.4 5.8*  ALBUMIN 2.7* 2.0*   No results for input(s): LIPASE, AMYLASE in the last 168 hours. No results for input(s): AMMONIA in the last 168 hours. Coagulation Profile: No results for input(s): INR, PROTIME in the last 168 hours. Cardiac Enzymes: No results for input(s): CKTOTAL, CKMB, CKMBINDEX, TROPONINI in the last 168 hours. BNP (last 3 results) No results for input(s): PROBNP in the last 8760 hours. HbA1C: No results for input(s): HGBA1C in the last 72 hours. CBG: No results for input(s): GLUCAP in the last 168 hours. Lipid Profile: No results  for input(s): CHOL, HDL, LDLCALC, TRIG, CHOLHDL, LDLDIRECT in the last 72 hours. Thyroid Function Tests: No results for input(s): TSH, T4TOTAL, FREET4, T3FREE, THYROIDAB in the last 72 hours. Anemia Panel: No results for input(s): VITAMINB12, FOLATE, FERRITIN, TIBC, IRON, RETICCTPCT in the last 72 hours. Urine analysis:    Component Value Date/Time   COLORURINE STRAW (A) 11/22/2019 0919   APPEARANCEUR CLEAR 11/22/2019 0919   LABSPEC 1.008 11/22/2019 0919   PHURINE 6.0 11/22/2019 0919   GLUCOSEU NEGATIVE 11/22/2019 0919   HGBUR NEGATIVE 11/22/2019 0919   BILIRUBINUR NEGATIVE 11/22/2019 0919   KETONESUR NEGATIVE 11/22/2019 0919   PROTEINUR NEGATIVE 11/22/2019 0919   NITRITE NEGATIVE 11/22/2019 0919   LEUKOCYTESUR NEGATIVE 11/22/2019 0919   Recent Results (from the past 240 hour(s))  SARS Coronavirus 2 by RT PCR (hospital order, performed in [facility] hospital lab) Nasopharyngeal Nasopharyngeal Swab     Status: None   Collection Time: 06/19/20  3:23 PM   Specimen: Nasopharyngeal Swab  Result  Value Ref Range Status   SARS Coronavirus 2 NEGATIVE NEGATIVE Final    Comment: (NOTE) SARS-CoV-2 target nucleic acids are NOT DETECTED.  The SARS-CoV-2 RNA is generally detectable in upper and lower respiratory specimens during the acute phase of infection. The lowest concentration of SARS-CoV-2 viral copies this assay can detect is 250 copies / mL. A negative result does not preclude SARS-CoV-2 infection and should not be used as the sole basis for treatment or other patient management decisions.  A negative result may occur with improper specimen collection / handling, submission of specimen other than nasopharyngeal swab, presence of viral mutation(s) within the areas targeted by this assay, and inadequate number of viral copies (<250 copies / mL). A negative result must be combined with clinical observations, patient history, and epidemiological information.  Fact Sheet for Patients:    StrictlyIdeas.no  Fact Sheet for Healthcare Providers: BankingDealers.co.za  This test is not yet approved or  cleared by the [place] FDA and has been authorized for detection and/or diagnosis of SARS-CoV-2 by FDA under an Emergency Use Authorization (EUA).  This EUA will remain in effect (meaning this test can be used) for the duration of the COVID-19 declaration under Section 564(b)(1) of the Act, 21 U.S.C. section 360bbb-3(b)(1), unless the authorization is terminated or revoked sooner.  Performed at [facility], [place] [address], [place], [place] [address]   Aerobic/Anaerobic Culture (surgical/deep wound)     Status: None (Preliminary result)   Collection Time: 06/19/20  7:22 PM   Specimen: Abscess  Result Value Ref Range Status   Specimen Description   Final    ABSCESS TESTICULAR Performed at [place] [address] [place], [place] [address]    Special Requests   Final    NONE Performed at [facility], [place] [address] [place], [place] [address]    Gram Stain   Final    RARE WBC PRESENT, PREDOMINANTLY MONONUCLEAR MODERATE GRAM POSITIVE COCCI IN PAIRS MODERATE GRAM VARIABLE ROD    Culture   Final    CULTURE REINCUBATED FOR BETTER GROWTH Performed at [facility], [place] [address], [place], [place] [address]    Report Status PENDING  Incomplete      Radiology Studies: No results found.  Scheduled Meds: . Chlorhexidine Gluconate Cloth  6 each Topical Daily  . cholestyramine light  4 g Oral Q24H  . latanoprost  1 drop Both Eyes QHS  . sodium chloride flush  3 mL Intravenous Q12H  . tamsulosin  0.8 mg Oral QHS   Continuous Infusions: . clindamycin (CLEOCIN) IV 900 mg (06/21/20 1302)  . lactated ringers 75 mL/hr at 06/19/20 1847  . piperacillin-tazobactam (ZOSYN)  IV 3.375 g (06/21/20 1358)  . vancomycin 750 mg (06/21/20 0355)     LOS: 2 days    Time spent: 25 minutes.  Patrecia Pour, MD [facility] [email or web address] 06/21/2020, 3:11 PM

## 2020-06-22 LAB — RENAL FUNCTION PANEL
Albumin: 2 g/dL — ABNORMAL LOW (ref 3.5–5.0)
Anion gap: 11 (ref 5–15)
BUN: 25 mg/dL — ABNORMAL HIGH (ref 8–23)
CO2: 20 mmol/L — ABNORMAL LOW (ref 22–32)
Calcium: 8.7 mg/dL — ABNORMAL LOW (ref 8.9–10.3)
Chloride: 105 mmol/L (ref 98–111)
Creatinine, Ser: 1.79 mg/dL — ABNORMAL HIGH (ref 0.61–1.24)
GFR calc Af Amer: 41 mL/min — ABNORMAL LOW (ref >60)
GFR calc non Af Amer: 35 mL/min — ABNORMAL LOW (ref >60)
Glucose, Bld: 116 mg/dL — ABNORMAL HIGH (ref 70–99)
Phosphorus: 4.2 mg/dL (ref 2.5–4.6)
Potassium: 4 mmol/L (ref 3.5–5.1)
Sodium: 136 mmol/L (ref 135–145)

## 2020-06-22 LAB — CBC
HCT: 24.6 % — ABNORMAL LOW (ref 39.0–52.0)
Hemoglobin: 8 g/dL — ABNORMAL LOW (ref 13.0–17.0)
MCH: 26.4 pg (ref 26.0–34.0)
MCHC: 32.5 g/dL (ref 30.0–36.0)
MCV: 81.2 fL (ref 80.0–100.0)
Platelets: 377 10*3/uL (ref 150–400)
RBC: 3.03 MIL/uL — ABNORMAL LOW (ref 4.22–5.81)
RDW: 17.3 % — ABNORMAL HIGH (ref 11.5–15.5)
WBC: 9.9 10*3/uL (ref 4.0–10.5)
nRBC: 0 % (ref 0.0–0.2)

## 2020-06-22 LAB — CREATININE, SERUM
Creatinine, Ser: 1.74 mg/dL — ABNORMAL HIGH (ref 0.61–1.24)
GFR calc Af Amer: 42 mL/min — ABNORMAL LOW (ref >60)
GFR calc non Af Amer: 36 mL/min — ABNORMAL LOW (ref >60)

## 2020-06-22 MED ORDER — AMOXICILLIN-POT CLAVULANATE 875-125 MG PO TABS
1.0000 | ORAL_TABLET | Freq: Two times a day (BID) | ORAL | 0 refills | Status: AC
Start: 2020-06-22 — End: 2020-07-02

## 2020-06-22 NOTE — TOC Progression Note (Signed)
Transition of Care [facility]) - Progression Note    Patient Details  Name: Shawn Chambers MRN: [id number] Date of Birth: Mar 04, 1941  [facility]) CM/SW Contact  Joaquin Courts, RN Phone Number: 06/22/2020, 1:47 PM  Clinical Narrative:   CM spoke with [facility] rep who confirms that agency can manage patient's daily dressing changes at home once he is discharged.     Expected Discharge Plan: Home w Hospice Care Barriers to Discharge: Continued Medical Work up  Expected Discharge Plan and Services Expected Discharge Plan: Home w Hospice Care In-house Referral: Clinical Social Work   Post Acute Care Choice: Resumption of Svcs/PTA Provider Living arrangements for the past 2 months: Single Family Home                                       Social Determinants of Health ([place]) Interventions    Readmission Risk Interventions Readmission Risk Prevention Plan 06/20/2020 04/24/2020  Transportation Screening Complete Complete  PCP or Specialist Appt within 3-5 Days - Complete  Home Care Screening Not Complete -  Home Care Screening Not Completed Comments Pt active with hospice -  Medication Review (RN CM) Complete -  HRI or [place] - Complete  Social Work Consult for [place] Planning/Counseling - Complete  Palliative Care Screening - Complete  Medication Review Press photographer) - Complete  Some recent data might be hidden

## 2020-06-22 NOTE — Discharge Summary (Signed)
Physician Discharge Summary  Shawn Chambers HLK:[id number] DOB: [birth year] DOA: 06/19/2020  PCP: [name]  Admit date: 06/19/2020 Discharge date: 06/22/2020  Admitted From: Home Disposition: Home with hospice   Recommendations for Outpatient Follow-up:  1. Follow up with home hospice 2. Follow up with urology.  3. Follow up final culture data (pending at discharge). Discharged on augmentin for 2 weeks and will continue daily dressing changes. ADDENDUM: Multiple organisms on culture without predominance, mixed anaerobic flora.  Home Health: Hospice Equipment/Devices: Wound packing Discharge Condition: Stable CODE STATUS: Full during this admission Diet recommendation: As tolerated  Brief/Interim Summary: Shawn Chambers is an 78 y.o.malehospice patient with a history of colorectal cancer s/p colectomy and colostomy 2018, BPH, GERD, and SBO s/p venting PEG who presented to urology office for scrotal swelling and pain despite taking oral antibiotics found to have cellulitis and abscess on U/S and admitted 7/8 for debridement. Per urology, involvement appeared like early Fournier's gangrene, for which antibiotics were broadened to vancomycin, clindamycin, and zosyn. IV fluids were also administered for AKI and postoperative anemia to 6.0 g/dl was treated with 1u PRBCs with improvement. Broad antibiotics were continued with improvement and he is discharged in stable condition after we've confirmed home hospice can perform daily dressing changes.   Discharge Diagnoses:  Active Problems:   Testicular abscess   Fournier's gangrene of scrotum  Fournier's gangrene: Per report from urology s/p right orchiectomy, scrotum exploration and debridement 7/8.  - Continue augmentin x2 weeks per urology recommendations. Culture data ultimately is unhelpful. - Will require daily W > D packing changes after DC. CM has confirmed that will be provided by hospice care.   AKI on stage IIIa CKD  with NAGMA and hypovolemic hyponatremia: Due to infection, cipro and bactrim, frequent NSAIDs. Alternatively, right hydronephrosis noted on previous CT may have advanced. - Continues to have improvement, no evidence of overload  Colon cancer: No further interventions planned.  - Continue home hospice care.   History of SBO with venting gastrostomy: - Continue periodic venting  BPH:  - Continue flomax  Anemia of chronic disease and acute postoperative anemia: Likely both due to malignancy and CKD. s/p 1u PRBCs 7/9 with improvement in hgb.   Thrombocytosis: Reactive to infection, resolved.  Hyponatremia: LR was given.  Discharge Instructions Discharge Instructions    Discharge instructions   Complete by: As directed    You were admitted for a severe infection in the testicle and scrotum treated with surgery and IV antibiotics. Your labs have improved significantly, your wound appears to be healing normally and you are stable for discharge with the following requirements:  - Change the packing on the dressing once daily until you follow up with urology, Dr. Milford Cage at [facility] later this week. Call their office to confirm an appointment this week.  - Continue antibiotics for a total of 2 weeks. Augmentin twice daily for 10 more days has been sent to your pharmacy.  - If you are unable to get dressing changes or to take the antibiotics, or if you develop fever, worsening redness, discharge, or pain around the scrotum, you must seek medical attention right away.   Discharge wound care:   Complete by: As directed    Change packing wet to dry once daily, keep clean.     Allergies as of 06/22/2020   No Known Allergies     Medication List    STOP taking these medications   ciprofloxacin 500 MG tablet Commonly known as:  CIPRO     TAKE these medications   amoxicillin-clavulanate 875-125 MG tablet Commonly known as: Augmentin Take 1 tablet by mouth 2 (two) times daily for  10 days.   bisacodyl 5 MG EC tablet Commonly known as: DULCOLAX Take 5 mg by mouth daily as needed for moderate constipation.   cholestyramine 4 GM/DOSE powder Commonly known as: QUESTRAN Take 4 g by mouth daily.   [employer] Purelax 17 g packet Generic drug: polyethylene glycol Take 1 packet by mouth daily as needed for moderate constipation.   Diflucan 100 MG tablet Generic drug: fluconazole Take 100 mg by mouth daily.   docusate sodium 100 MG capsule Commonly known as: COLACE Take 100 mg by mouth 2 (two) times daily as needed for mild constipation.   HYDROcodone-acetaminophen 5-325 MG tablet Commonly known as: NORCO/VICODIN Take 0.5-1 tablets by mouth every 4 (four) hours as needed for moderate pain or severe pain.   ibuprofen 200 MG tablet Commonly known as: ADVIL Take 200 mg by mouth every 6 (six) hours as needed. What changed: Another medication with the same name was removed. Continue taking this medication, and follow the directions you see here.   latanoprost 0.005 % ophthalmic solution Commonly known as: XALATAN Place 1 drop into both eyes at bedtime.   loperamide 2 MG capsule Commonly known as: IMODIUM Take 2 capsules (4 mg total) by mouth 4 (four) times daily as needed for diarrhea or loose stools.   magnesium citrate Soln Take 0.5 Bottles by mouth as needed for severe constipation.   ondansetron 4 MG tablet Commonly known as: ZOFRAN Take 4 mg by mouth every 8 (eight) hours as needed for nausea or vomiting.   tamsulosin 0.4 MG Caps capsule Commonly known as: FLOMAX Take 0.8 mg by mouth at bedtime.            Discharge Care Instructions  (From admission, onward)         Start     Ordered   06/22/20 0000  Discharge wound care:       Comments: Change packing wet to dry once daily, keep clean.   06/22/20 1354          Follow-up Information    [facility]. Schedule an appointment as soon as possible for a visit.   Contact  information: [place] [phone]        Remi Haggard, MD. Schedule an appointment as soon as possible for a visit in 1 day(s).   Specialty: Urology Contact information: [address] [place] [address] [phone]              No Known Allergies  Consultations:  Urology  Procedures/Studies:  No results found.  Subjective: Wants to go home. Pain well controlled, functionally mobile at baseline. No fever, doesn't feel ill.  Discharge Exam: Vitals:   06/22/20 0545 06/22/20 1249  BP: 116/75 123/65  Pulse: 68 73  Resp: 20 16  Temp: 98 F (36.7 C) 98 F (36.7 C)  SpO2: 96% 99%   General: Pt is alert, awake, not in acute distress Cardiovascular: RRR, S1/S2 +, no rubs, no gallops Respiratory: CTA bilaterally, no wheezing, no rhonchi Abdominal: Soft, NT, ND, bowel sounds + Extremities: No edema, no cyanosis Skin: Right hemiscrotum with packing in place, edema much improved. No exudate.  Labs: BNP (last 3 results) No results for input(s): BNP in the last 8760 hours. Basic Metabolic Panel: Recent Labs  Lab 06/19/20 1600 06/20/20 0507 06/21/20 0536 06/22/20 0542  NA 131* 130* 132* 136  K 3.8 3.9 4.0 4.0  CL 101 102 104 105  CO2 17* 18* 17* 20*  GLUCOSE 131* 103* 130* 116*  BUN 35* 34* 30* 25*  CREATININE 2.13* 2.02* 1.89* 1.79*  1.74*  CALCIUM 9.0 8.5* 8.4* 8.7*  PHOS  --   --   --  4.2   Liver Function Tests: Recent Labs  Lab 06/19/20 1600 06/21/20 0536 06/22/20 0542  AST 19 13*  --   ALT 27 18  --   ALKPHOS 149* 128*  --   BILITOT 0.5 0.7  --   PROT 7.4 5.8*  --   ALBUMIN 2.7* 2.0* 2.0*   No results for input(s): LIPASE, AMYLASE in the last 168 hours. No results for input(s): AMMONIA in the last 168 hours. CBC: Recent Labs  Lab 06/19/20 1600 06/20/20 0507 06/20/20 1634 06/21/20 0536 06/22/20 0542  WBC 17.5* 12.8*  --  11.4* 9.9  HGB 7.7* 6.0* 7.2* 7.6* 8.0*  HCT 25.8* 18.8* 22.5*  23.5* 24.6*  MCV 85.7 80.7  --  81.3 81.2  PLT 401* 301  --  324 377   Cardiac Enzymes: No results for input(s): CKTOTAL, CKMB, CKMBINDEX, TROPONINI in the last 168 hours. BNP: Invalid input(s): POCBNP CBG: No results for input(s): GLUCAP in the last 168 hours. D-Dimer No results for input(s): DDIMER in the last 72 hours. Hgb A1c No results for input(s): HGBA1C in the last 72 hours. Lipid Profile No results for input(s): CHOL, HDL, LDLCALC, TRIG, CHOLHDL, LDLDIRECT in the last 72 hours. Thyroid function studies No results for input(s): TSH, T4TOTAL, T3FREE, THYROIDAB in the last 72 hours.  Invalid input(s): FREET3 Anemia work up No results for input(s): VITAMINB12, FOLATE, FERRITIN, TIBC, IRON, RETICCTPCT in the last 72 hours. Urinalysis    Component Value Date/Time   COLORURINE STRAW (A) 11/22/2019 0919   APPEARANCEUR CLEAR 11/22/2019 0919   LABSPEC 1.008 11/22/2019 0919   PHURINE 6.0 11/22/2019 0919   GLUCOSEU NEGATIVE 11/22/2019 0919   HGBUR NEGATIVE 11/22/2019 0919   BILIRUBINUR NEGATIVE 11/22/2019 0919   KETONESUR NEGATIVE 11/22/2019 0919   PROTEINUR NEGATIVE 11/22/2019 0919   NITRITE NEGATIVE 11/22/2019 0919   LEUKOCYTESUR NEGATIVE 11/22/2019 0919    Microbiology Recent Results (from the past 240 hour(s))  SARS Coronavirus 2 by RT PCR (hospital order, performed in [facility] hospital lab) Nasopharyngeal Nasopharyngeal Swab     Status: None   Collection Time: 06/19/20  3:23 PM   Specimen: Nasopharyngeal Swab  Result Value Ref Range Status   SARS Coronavirus 2 NEGATIVE NEGATIVE Final    Comment: (NOTE) SARS-CoV-2 target nucleic acids are NOT DETECTED.  The SARS-CoV-2 RNA is generally detectable in upper and lower respiratory specimens during the acute phase of infection. The lowest concentration of SARS-CoV-2 viral copies this assay can detect is 250 copies / mL. A negative result does not preclude SARS-CoV-2 infection and should not be used as the sole basis  for treatment or other patient management decisions.  A negative result may occur with improper specimen collection / handling, submission of specimen other than nasopharyngeal swab, presence of viral mutation(s) within the areas targeted by this assay, and inadequate number of viral copies (<250 copies / mL). A negative result must be combined with clinical observations, patient history, and epidemiological information.  Fact Sheet for Patients:   StrictlyIdeas.no  Fact Sheet for Healthcare Providers: BankingDealers.co.za  This test is not yet approved  or  cleared by the [place] and has been authorized for detection and/or diagnosis of SARS-CoV-2 by FDA under an Emergency Use Authorization (EUA).  This EUA will remain in effect (meaning this test can be used) for the duration of the COVID-19 declaration under Section 564(b)(1) of the Act, 21 U.S.C. section 360bbb-3(b)(1), unless the authorization is terminated or revoked sooner.  Performed at [facility], [place] [address], [place], [place] [address]   Aerobic/Anaerobic Culture (surgical/deep wound)     Status: None (Preliminary result)   Collection Time: 06/19/20  7:22 PM   Specimen: Abscess  Result Value Ref Range Status   Specimen Description   Final    ABSCESS TESTICULAR Performed at [facility] [address] [place], [place] [address]    Special Requests   Final    NONE Performed at [facility], [place] [address], [place], [place] [address]    Gram Stain   Final    RARE WBC PRESENT, PREDOMINANTLY MONONUCLEAR MODERATE GRAM POSITIVE COCCI IN PAIRS MODERATE GRAM VARIABLE ROD    Culture   Final    CULTURE REINCUBATED FOR BETTER GROWTH Performed at [facility], [place] [address], [place],  [address]    Report Status PENDING  Incomplete    Time coordinating discharge: Approximately 40  minutes  Patrecia Pour, MD  [facility] 06/22/2020, 1:54 PM

## 2020-06-22 NOTE — Progress Notes (Signed)
3 Days Post-Op   Subjective/Chief Complaint:  1 - Testicular / Scrotal Abscess - s/p RIGHT orchiectomy / debreidement of complex abscess 06/19/20. WCX GPC, GNR/pending. Placed on empiric Zosyn/Vanc/Clinda. At baseline he is on hospice for colorectal cancer and recurrent SBO.   Today "Shawn Chambers" is stable. NO fevers. Doing better with dressing changes which can hopefully be done by hospice RN daily at discharge.    Objective: Vital signs in last 24 hours: Temp:  [97.6 F (36.4 C)-98 F (36.7 C)] 98 F (36.7 C) (07/11 0545) Pulse Rate:  [64-68] 68 (07/11 0545) Resp:  [17-20] 20 (07/11 0545) BP: (106-116)/(60-75) 116/75 (07/11 0545) SpO2:  [96 %-99 %] 96 % (07/11 0545) Last BM Date: 06/20/20 (output in stoma yesterday )  Intake/Output from previous day: 07/10 0701 - 07/11 0700 In: 1578 [P.O.:1080; IV Piggyback:498] Out: 8657 [Urine:1550] Intake/Output this shift: No intake/output data recorded.  General appearance: alert, cooperative and uncooperative Eyes: negative Nose: Nares normal. Septum midline. Mucosa normal. No drainage or sinus tenderness. Throat: lips, mucosa, and tongue normal; teeth and gums normal Neck: supple, symmetrical, trachea midline Back: symmetric, no curvature. ROM normal. No CVA tenderness. Resp: non-labored on RA Cardio: Nl rate GI: soft, non-tender; bowel sounds normal; no masses,  no organomegaly and LLQ colostomy patent of stool and gas Male genitalia: Rt sided scrotal wound c/d/i wtih packing in place. No crepitus / fluctuence.  Skin: Skin color, texture, turgor normal. No rashes or lesions Lymph nodes: Cervical, supraclavicular, and axillary nodes normal. Neurologic: Grossly normal UNcircumcised with some mild foreskin reactive edema. Foley removed.   Lab Results:  Recent Labs    06/21/20 0536 06/22/20 0542  WBC 11.4* 9.9  HGB 7.6* 8.0*  HCT 23.5* 24.6*  PLT 324 377   BMET Recent Labs    06/21/20 0536 06/22/20 0542  NA 132* 136  K 4.0 4.0   CL 104 105  CO2 17* 20*  GLUCOSE 130* 116*  BUN 30* 25*  CREATININE 1.89* 1.79*  1.74*  CALCIUM 8.4* 8.7*   PT/INR No results for input(s): LABPROT, INR in the last 72 hours. ABG No results for input(s): PHART, HCO3 in the last 72 hours.  Invalid input(s): PCO2, PO2  Studies/Results: No results found.  Anti-infectives: Anti-infectives (From admission, onward)   Start     Dose/Rate Route Frequency Ordered Stop   06/20/20 2200  vancomycin (VANCOREADY) IVPB 750 mg/150 mL     Discontinue     750 mg 150 mL/hr over 60 Minutes Intravenous Every 24 hours 06/19/20 2103     06/20/20 1000  doxycycline (VIBRA-TABS) tablet 100 mg  Status:  Discontinued        100 mg Oral Every 12 hours 06/20/20 0126 06/20/20 0138   06/19/20 2200  clindamycin (CLEOCIN) IVPB 900 mg     Discontinue     900 mg 100 mL/hr over 30 Minutes Intravenous Every 8 hours 06/19/20 2023     06/19/20 2100  vancomycin (VANCOREADY) IVPB 1750 mg/350 mL        1,750 mg 175 mL/hr over 120 Minutes Intravenous  Once 06/19/20 2035 06/20/20 0159   06/19/20 2100  piperacillin-tazobactam (ZOSYN) IVPB 3.375 g     Discontinue     3.375 g 12.5 mL/hr over 240 Minutes Intravenous Every 8 hours 06/19/20 2035     06/19/20 1708  cefTRIAXone (ROCEPHIN) 2 g in sodium chloride 0.9 % 100 mL IVPB        2 g 200 mL/hr over 30 Minutes Intravenous 30  min pre-op 06/19/20 1708 06/19/20 1903      Assessment/Plan:  1 - Testicular / Scrotal Abscess - doing well POD 3. Agree with plan for DC home with home hospice and daily dressing changes. If no furhter CX data avail, then PO augmentin fo 2 week total course would be appropriate.  Foley now out, he has no baselien voiding complaints per pt.     Shawn Chambers 06/22/2020

## 2020-06-22 NOTE — Progress Notes (Signed)
Pt provided discharge packet and pt, along with son and daughter, were given verbal discharge instructions. IV removed. Pt dressed in personal clothing. Personal belonging bag given that included everything needed to provide wound care to the pt. Pt wound dressing was changed prior to discharge. Pt taken down via wheelchair. All questions answered.

## 2020-06-23 LAB — AEROBIC/ANAEROBIC CULTURE W GRAM STAIN (SURGICAL/DEEP WOUND)

## 2020-06-23 LAB — SURGICAL PATHOLOGY

## 2020-06-24 LAB — TYPE AND SCREEN
ABO/RH(D): A POS
Antibody Screen: NEGATIVE
Unit division: 0
Unit division: 0

## 2020-06-24 LAB — BPAM RBC
Blood Product Expiration Date: 202107292359
Blood Product Expiration Date: 202107292359
ISSUE DATE / TIME: 202107090823
Unit Type and Rh: 6200
Unit Type and Rh: 6200

## 2020-06-26 ENCOUNTER — Other Ambulatory Visit: Payer: Medicare Other

## 2020-06-28 ENCOUNTER — Emergency Department (HOSPITAL_COMMUNITY)

## 2020-06-28 ENCOUNTER — Inpatient Hospital Stay (HOSPITAL_COMMUNITY)

## 2020-06-28 ENCOUNTER — Other Ambulatory Visit: Payer: Self-pay

## 2020-06-28 ENCOUNTER — Encounter (HOSPITAL_COMMUNITY): Payer: Self-pay

## 2020-06-28 ENCOUNTER — Inpatient Hospital Stay (HOSPITAL_COMMUNITY)
Admission: EM | Admit: 2020-06-28 | Discharge: 2020-06-30 | DRG: 389 | Disposition: A | Discharge status: Hospice | Attending: Family Medicine | Admitting: Family Medicine

## 2020-06-28 DIAGNOSIS — E875 Hyperkalemia: Secondary | ICD-10-CM | POA: Diagnosis present

## 2020-06-28 DIAGNOSIS — Z85528 Personal history of other malignant neoplasm of kidney: Secondary | ICD-10-CM

## 2020-06-28 DIAGNOSIS — Z20822 Contact with and (suspected) exposure to covid-19: Secondary | ICD-10-CM | POA: Diagnosis present

## 2020-06-28 DIAGNOSIS — N400 Enlarged prostate without lower urinary tract symptoms: Secondary | ICD-10-CM | POA: Diagnosis present

## 2020-06-28 DIAGNOSIS — C218 Malignant neoplasm of overlapping sites of rectum, anus and anal canal: Secondary | ICD-10-CM | POA: Diagnosis present

## 2020-06-28 DIAGNOSIS — K219 Gastro-esophageal reflux disease without esophagitis: Secondary | ICD-10-CM | POA: Diagnosis present

## 2020-06-28 DIAGNOSIS — N183 Chronic kidney disease, stage 3 unspecified: Secondary | ICD-10-CM | POA: Diagnosis present

## 2020-06-28 DIAGNOSIS — H409 Unspecified glaucoma: Secondary | ICD-10-CM | POA: Diagnosis present

## 2020-06-28 DIAGNOSIS — Z66 Do not resuscitate: Secondary | ICD-10-CM | POA: Diagnosis not present

## 2020-06-28 DIAGNOSIS — D631 Anemia in chronic kidney disease: Secondary | ICD-10-CM | POA: Diagnosis present

## 2020-06-28 DIAGNOSIS — Z933 Colostomy status: Secondary | ICD-10-CM

## 2020-06-28 DIAGNOSIS — R569 Unspecified convulsions: Secondary | ICD-10-CM | POA: Diagnosis present

## 2020-06-28 DIAGNOSIS — C189 Malignant neoplasm of colon, unspecified: Secondary | ICD-10-CM | POA: Diagnosis present

## 2020-06-28 DIAGNOSIS — Z87891 Personal history of nicotine dependence: Secondary | ICD-10-CM | POA: Diagnosis not present

## 2020-06-28 DIAGNOSIS — H9190 Unspecified hearing loss, unspecified ear: Secondary | ICD-10-CM

## 2020-06-28 DIAGNOSIS — K565 Intestinal adhesions [bands], unspecified as to partial versus complete obstruction: Secondary | ICD-10-CM | POA: Diagnosis present

## 2020-06-28 DIAGNOSIS — N179 Acute kidney failure, unspecified: Secondary | ICD-10-CM | POA: Diagnosis present

## 2020-06-28 DIAGNOSIS — Z9049 Acquired absence of other specified parts of digestive tract: Secondary | ICD-10-CM | POA: Diagnosis not present

## 2020-06-28 DIAGNOSIS — K56609 Unspecified intestinal obstruction, unspecified as to partial versus complete obstruction: Secondary | ICD-10-CM | POA: Diagnosis present

## 2020-06-28 DIAGNOSIS — C19 Malignant neoplasm of rectosigmoid junction: Secondary | ICD-10-CM

## 2020-06-28 DIAGNOSIS — C186 Malignant neoplasm of descending colon: Secondary | ICD-10-CM | POA: Diagnosis present

## 2020-06-28 DIAGNOSIS — N1831 Chronic kidney disease, stage 3a: Secondary | ICD-10-CM | POA: Diagnosis present

## 2020-06-28 DIAGNOSIS — C775 Secondary and unspecified malignant neoplasm of intrapelvic lymph nodes: Secondary | ICD-10-CM | POA: Diagnosis present

## 2020-06-28 DIAGNOSIS — Z905 Acquired absence of kidney: Secondary | ICD-10-CM | POA: Diagnosis not present

## 2020-06-28 DIAGNOSIS — R109 Unspecified abdominal pain: Secondary | ICD-10-CM | POA: Diagnosis present

## 2020-06-28 DIAGNOSIS — F17210 Nicotine dependence, cigarettes, uncomplicated: Secondary | ICD-10-CM | POA: Diagnosis present

## 2020-06-28 DIAGNOSIS — C649 Malignant neoplasm of unspecified kidney, except renal pelvis: Secondary | ICD-10-CM | POA: Diagnosis present

## 2020-06-28 DIAGNOSIS — Z4659 Encounter for fitting and adjustment of other gastrointestinal appliance and device: Secondary | ICD-10-CM

## 2020-06-28 DIAGNOSIS — Z800 Family history of malignant neoplasm of digestive organs: Secondary | ICD-10-CM

## 2020-06-28 DIAGNOSIS — R140 Abdominal distension (gaseous): Secondary | ICD-10-CM

## 2020-06-28 DIAGNOSIS — Z8371 Family history of colonic polyps: Secondary | ICD-10-CM | POA: Diagnosis not present

## 2020-06-28 DIAGNOSIS — Z87898 Personal history of other specified conditions: Secondary | ICD-10-CM

## 2020-06-28 LAB — COMPREHENSIVE METABOLIC PANEL
ALT: 21 U/L (ref 0–44)
AST: 19 U/L (ref 15–41)
Albumin: 3 g/dL — ABNORMAL LOW (ref 3.5–5.0)
Alkaline Phosphatase: 96 U/L (ref 38–126)
Anion gap: 12 (ref 5–15)
BUN: 30 mg/dL — ABNORMAL HIGH (ref 8–23)
CO2: 29 mmol/L (ref 22–32)
Calcium: 9.6 mg/dL (ref 8.9–10.3)
Chloride: 93 mmol/L — ABNORMAL LOW (ref 98–111)
Creatinine, Ser: 1.87 mg/dL — ABNORMAL HIGH (ref 0.61–1.24)
GFR calc Af Amer: 38 mL/min — ABNORMAL LOW (ref >60)
GFR calc non Af Amer: 33 mL/min — ABNORMAL LOW (ref >60)
Glucose, Bld: 95 mg/dL (ref 70–99)
Potassium: 5.6 mmol/L — ABNORMAL HIGH (ref 3.5–5.1)
Sodium: 134 mmol/L — ABNORMAL LOW (ref 135–145)
Total Bilirubin: 0.5 mg/dL (ref 0.3–1.2)
Total Protein: 7.1 g/dL (ref 6.5–8.1)

## 2020-06-28 LAB — CBC WITH DIFFERENTIAL/PLATELET
Abs Immature Granulocytes: 0.06 10*3/uL (ref 0.00–0.07)
Basophils Absolute: 0 10*3/uL (ref 0.0–0.1)
Basophils Relative: 0 %
Eosinophils Absolute: 0.1 10*3/uL (ref 0.0–0.5)
Eosinophils Relative: 1 %
HCT: 28.7 % — ABNORMAL LOW (ref 39.0–52.0)
Hemoglobin: 8.8 g/dL — ABNORMAL LOW (ref 13.0–17.0)
Immature Granulocytes: 1 %
Lymphocytes Relative: 9 %
Lymphs Abs: 0.9 10*3/uL (ref 0.7–4.0)
MCH: 25.7 pg — ABNORMAL LOW (ref 26.0–34.0)
MCHC: 30.7 g/dL (ref 30.0–36.0)
MCV: 83.7 fL (ref 80.0–100.0)
Monocytes Absolute: 1 10*3/uL (ref 0.1–1.0)
Monocytes Relative: 9 %
Neutro Abs: 8.6 10*3/uL — ABNORMAL HIGH (ref 1.7–7.7)
Neutrophils Relative %: 80 %
Platelets: 483 10*3/uL — ABNORMAL HIGH (ref 150–400)
RBC: 3.43 MIL/uL — ABNORMAL LOW (ref 4.22–5.81)
RDW: 18 % — ABNORMAL HIGH (ref 11.5–15.5)
WBC: 10.6 10*3/uL — ABNORMAL HIGH (ref 4.0–10.5)
nRBC: 0 % (ref 0.0–0.2)

## 2020-06-28 LAB — LIPASE, BLOOD: Lipase: 36 U/L (ref 11–51)

## 2020-06-28 LAB — SARS CORONAVIRUS 2 BY RT PCR (HOSPITAL ORDER, PERFORMED IN ~~LOC~~ HOSPITAL LAB): SARS Coronavirus 2: NEGATIVE

## 2020-06-28 MED ORDER — SODIUM CHLORIDE 0.9 % IV BOLUS
500.0000 mL | Freq: Once | INTRAVENOUS | Status: AC
  Administered 2020-06-28: 500 mL via INTRAVENOUS

## 2020-06-28 MED ORDER — HYDROMORPHONE HCL 1 MG/ML IJ SOLN: 1.0000 mg | INTRAMUSCULAR | Status: DC | PRN

## 2020-06-28 MED ORDER — MORPHINE SULFATE (PF) 4 MG/ML IV SOLN
4.0000 mg | Freq: Once | INTRAVENOUS | Status: DC
  Filled 2020-06-28: qty 1

## 2020-06-28 MED ORDER — SODIUM CHLORIDE 0.9 % IV BOLUS
1000.0000 mL | Freq: Once | INTRAVENOUS | Status: AC
  Administered 2020-06-28: 1000 mL via INTRAVENOUS

## 2020-06-28 MED ORDER — ENOXAPARIN SODIUM 40 MG/0.4ML ~~LOC~~ SOLN
40.0000 mg | SUBCUTANEOUS | Status: DC
  Administered 2020-06-28: 40 mg via SUBCUTANEOUS
  Filled 2020-06-28: qty 0.4

## 2020-06-28 MED ORDER — POLYETHYLENE GLYCOL 3350 17 G PO PACK: 1.0000 | PACK | Freq: Every day | ORAL | Status: DC | PRN

## 2020-06-28 MED ORDER — DEXTROSE-NACL 5-0.9 % IV SOLN: INTRAVENOUS | Status: DC

## 2020-06-28 MED ORDER — BISACODYL 5 MG PO TBEC: 5.0000 mg | DELAYED_RELEASE_TABLET | Freq: Every day | ORAL | Status: DC | PRN

## 2020-06-28 MED ORDER — ONDANSETRON HCL 4 MG PO TABS: 4.0000 mg | ORAL_TABLET | Freq: Three times a day (TID) | ORAL | Status: DC | PRN

## 2020-06-28 MED ORDER — HYDROCODONE-ACETAMINOPHEN 5-325 MG PO TABS: 0.5000 | ORAL_TABLET | ORAL | Status: DC | PRN

## 2020-06-28 MED ORDER — MAGNESIUM CITRATE PO SOLN: 0.5000 | ORAL | Status: DC | PRN

## 2020-06-28 MED ORDER — ONDANSETRON HCL 4 MG/2ML IJ SOLN: 4.0000 mg | Freq: Four times a day (QID) | INTRAMUSCULAR | Status: DC | PRN

## 2020-06-28 MED ORDER — ONDANSETRON HCL 4 MG PO TABS: 4.0000 mg | ORAL_TABLET | Freq: Four times a day (QID) | ORAL | Status: DC | PRN

## 2020-06-28 MED ORDER — LATANOPROST 0.005 % OP SOLN
1.0000 [drp] | Freq: Every day | OPHTHALMIC | Status: DC
  Administered 2020-06-28 – 2020-06-29 (×2): 1 [drp] via OPHTHALMIC
  Filled 2020-06-28: qty 2.5

## 2020-06-28 MED ORDER — TAMSULOSIN HCL 0.4 MG PO CAPS
0.8000 mg | ORAL_CAPSULE | Freq: Every day | ORAL | Status: DC
  Administered 2020-06-28 – 2020-06-29 (×2): 0.8 mg via ORAL
  Filled 2020-06-28 (×2): qty 2

## 2020-06-28 MED ORDER — ONDANSETRON HCL 4 MG/2ML IJ SOLN
4.0000 mg | Freq: Once | INTRAMUSCULAR | Status: AC
  Administered 2020-06-28: 4 mg via INTRAVENOUS
  Filled 2020-06-28: qty 2

## 2020-06-28 MED ORDER — AMOXICILLIN-POT CLAVULANATE 875-125 MG PO TABS
1.0000 | ORAL_TABLET | Freq: Two times a day (BID) | ORAL | Status: DC
  Administered 2020-06-28: 1 via ORAL
  Filled 2020-06-28: qty 1

## 2020-06-28 MED ORDER — FLUCONAZOLE 100 MG PO TABS
100.0000 mg | ORAL_TABLET | Freq: Every day | ORAL | Status: DC
  Administered 2020-06-28 – 2020-06-30 (×2): 100 mg via ORAL
  Filled 2020-06-28 (×2): qty 1

## 2020-06-28 MED ORDER — IBUPROFEN 200 MG PO TABS: 200.0000 mg | ORAL_TABLET | Freq: Four times a day (QID) | ORAL | Status: DC | PRN

## 2020-06-28 MED ORDER — PHENOL 1.4 % MT LIQD
1.0000 | OROMUCOSAL | Status: DC | PRN
  Administered 2020-06-28: 1 via OROMUCOSAL
  Filled 2020-06-28: qty 177

## 2020-06-28 MED ORDER — VITAMIN D 25 MCG (1000 UNIT) PO TABS
1000.0000 [IU] | ORAL_TABLET | Freq: Every day | ORAL | Status: DC
  Administered 2020-06-28: 1000 [IU] via ORAL
  Filled 2020-06-28: qty 1

## 2020-06-28 NOTE — H&P (Signed)
History and Physical   Shawn Chambers IRS:[id number] DOB: 01-Dec-1941 DOA: 06/28/2020  Referring MD/NP/PA: Dr. Ashok Cordia  PCP: [name]   Outpatient Specialists: Dr. Merced[facility]  Patient coming from: Home  Chief Complaint: Abdominal pain nausea and vomiting  HPI: Shawn Chambers is a 79 y.o. 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  Since he had his gastrostomy tube placed he has been drainage at least 3 times a day.  Lately however only a tablespoon or very little was draining.  Patient has had intermittent eating but has had significant loss of appetite.  He was seen in the ER and evaluated.  Found to have significant small bowel obstruction and is being admitted for treatment.  Patient is hard of hearing but awake alert and able to communicate.  He denied any fever or chills denied any hematemesis no melena.  Denied any blood in his ostomy bag.  He has been admitted for treatment of small bowel obstruction..  ED Course: Vitals appear stable overall.  White count 10.6 hemoglobin 8.8 and platelets 483.  Sodium 134 potassium 5.6 chloride 93 CO2 29 BUN 30 creatinine 1.87 and glucose 95.  Abdominal x-ray indicated distention around the gastric area consistent with small bowel obstruction.  NG tube inserted.  IV fluids started and patient being admitted for treatment  Review of Systems: As per HPI otherwise 10 point review of systems negative.    Past Medical History:  Diagnosis Date  . Arthritis    hand, knee- ? Dx.  Marland Kitchen BPH (benign prostatic hyperplasia)   . 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 Cataract    removed  . GERD (gastroesophageal reflux disease)   . Glaucoma   . Hearing loss   . Rectal adenocarcinoma  ([place])   . Renal cancer ([place])   . Seizures ([place])    resolved- daughter states she didnt know he ever had seizures    Past Surgical History:  Procedure Laterality Date  . APPENDECTOMY    . COLONOSCOPY    . COLOSTOMY REVISION N/A 10/18/2019   Procedure: OSTOMY REVISION;  Surgeon: Leighton Ruff, MD;  Location: [facility];  Service: General;  Laterality: N/A;  . CYSTOSCOPY WITH STENT PLACEMENT Bilateral 10/18/2019   Procedure: CYSTOSCOPY WITH FIREFLY INJECTION;  Surgeon: Cleon Gustin, MD;  Location: [facility];  Service: Urology;  Laterality: Bilateral;  . IR GASTROSTOMY TUBE MOD SED  04/24/2020  . PARTIAL COLECTOMY N/A 03/28/2017   Procedure: PARTIAL COLECTOMY/COLOSTOMY;  Surgeon: Georganna Skeans, MD;  Location: [facility];  Service: General;  Laterality: N/A;  Partial Colectomy, Mobilization of Splenic Flexure, Colostomy  . PARTIAL NEPHRECTOMY Left 03/28/2017   Procedure: NEPHRECTOMY TOTAL;  Surgeon: Raynelle Bring, MD;  Location: [facility];  Service: Urology;  Laterality: Left;  . SCROTAL EXPLORATION Right 06/19/2020   Procedure: SCROTUM EXPLORATION,  RIGHT SIMPLE ORCHIECTOMY;  Surgeon: Remi Haggard, MD;  Location: [facility];  Service: Urology;  Laterality: Right;  . XI ROBOTIC ASSISTED LOWER ANTERIOR RESECTION N/A 10/18/2019   Procedure: XI ROBOTIC ASSISTED LOWER ANTERIOR RESECTION WITH PERISTOMAL HERNIA REPAIR WITH MESH ( SUGAR BAKER);  Surgeon: Leighton Ruff, MD;  Location: [facility];  Service: General;  Laterality: N/A;     reports that he quit smoking about 3 years ago. His smoking use included cigarettes. He has a 60.00  pack-year smoking history. He has never used smokeless tobacco. He reports that he does not drink alcohol and does not use drugs.  No Known Allergies  Family History  Problem Relation Age of Onset  . Esophageal cancer Paternal Uncle   . Colon polyps Daughter   . Colon cancer Neg Hx   . Rectal cancer Neg Hx   . Stomach cancer Neg Hx      Prior to Admission medications     Medication Sig Start Date End Date Taking? Authorizing Provider  amoxicillin-clavulanate (AUGMENTIN) 875-125 MG tablet Take 1 tablet by mouth 2 (two) times daily for 10 days. Patient taking differently: Take 1 tablet by mouth 2 (two) times daily. Start date :06/22/20 06/22/20 07/02/20 Yes Patrecia Pour, MD  bisacodyl (DULCOLAX) 5 MG EC tablet Take 5 mg by mouth daily as needed for moderate constipation.   Yes [provider]  cholecalciferol (VITAMIN D3) 25 MCG (1000 UNIT) tablet Take 1,000 Units by mouth daily.   Yes [provider]  [employer] PURELAX 17 g packet Take 1 packet by mouth daily as needed for moderate constipation.  04/25/20  Yes [provider]  DIFLUCAN 100 MG tablet Take 100 mg by mouth daily. 06/18/20  Yes [provider]  HYDROcodone-acetaminophen (NORCO/VICODIN) 5-325 MG tablet Take 0.5-1 tablets by mouth every 4 (four) hours as needed for moderate pain or severe pain.  06/18/20  Yes [provider]  ibuprofen (ADVIL) 200 MG tablet Take 200 mg by mouth every 6 (six) hours as needed for moderate pain.    Yes [provider]  latanoprost (XALATAN) 0.005 % ophthalmic solution Place 1 drop into both eyes at bedtime.  12/27/18  Yes [provider]  magnesium citrate SOLN Take 0.5 Bottles by mouth as needed for severe constipation.   Yes [provider]  ondansetron (ZOFRAN) 4 MG tablet Take 4 mg by mouth every 8 (eight) hours as needed for nausea or vomiting.  04/25/20  Yes [provider]  tamsulosin (FLOMAX) 0.4 MG CAPS capsule Take 0.8 mg by mouth at bedtime.  02/03/19  Yes [provider]  docusate sodium (COLACE) 100 MG capsule Take 100 mg by mouth 2 (two) times daily as needed for mild constipation.  Patient not taking: Reported on 06/28/2020    [provider]  loperamide (IMODIUM) 2 MG capsule Take 2 capsules (4 mg total) by mouth 4 (four) times daily as needed for diarrhea or loose  stools. Patient not taking: Reported on 06/28/2020 05/19/20   Ladell Pier, MD    Physical Exam: Vitals:   06/28/20 1801 06/28/20 1815 06/28/20 1830 06/28/20 1845  BP: 125/69 110/62 121/73 121/73  Pulse:    80  Resp:    16  Temp:      TempSrc:      SpO2:    96%  Weight:      Height:          Constitutional: Acutely ill looking, hard of hearing.  No distress Vitals:   06/28/20 1801 06/28/20 1815 06/28/20 1830 06/28/20 1845  BP: 125/69 110/62 121/73 121/73  Pulse:    80  Resp:    16  Temp:      TempSrc:      SpO2:    96%  Weight:      Height:       Eyes: PERRL, lids and conjunctivae normal ENMT: Mucous membranes are dry. Posterior pharynx clear of any exudate or lesions.Normal dentition.  Neck: normal,  supple, no masses, no thyromegaly Respiratory: clear to auscultation bilaterally, no wheezing, no crackles. Normal respiratory effort. No accessory muscle use.  Cardiovascular: Regular rate and rhythm, no murmurs / rubs / gallops. No extremity edema. 2+ pedal pulses. No carotid bruits.  Abdomen: Distended abdomen: Diffusely tender, ostomy H shows pink patent with some small amount of brown stool.  No significant bowel sounds Musculoskeletal: no clubbing / cyanosis. No joint deformity upper and lower extremities. Good ROM, no contractures. Normal muscle tone.  Skin: no rashes, lesions, ulcers. No induration Neurologic: CN 2-12 grossly intact. Sensation intact, DTR normal. Strength 5/5 in all 4.  Psychiatric: Normal judgment and insight. Alert and oriented x 3. Normal mood.     Labs on Admission: I have personally reviewed following labs and imaging studies  CBC: Recent Labs  Lab 06/22/20 0542 06/28/20 1313  WBC 9.9 10.6*  NEUTROABS  --  8.6*  HGB 8.0* 8.8*  HCT 24.6* 28.7*  MCV 81.2 83.7  PLT 377 144*   Basic Metabolic Panel: Recent Labs  Lab 06/22/20 0542 06/28/20 1313  NA 136 134*  K 4.0 5.6*  CL 105 93*  CO2 20* 29  GLUCOSE 116* 95  BUN 25* 30*   CREATININE 1.79*  1.74* 1.87*  CALCIUM 8.7* 9.6  PHOS 4.2  --    GFR: Estimated Creatinine Clearance: 31.5 mL/min (A) (by C-G formula based on SCr of 1.87 mg/dL (H)). Liver Function Tests: Recent Labs  Lab 06/22/20 0542 06/28/20 1313  AST  --  19  ALT  --  21  ALKPHOS  --  96  BILITOT  --  0.5  PROT  --  7.1  ALBUMIN 2.0* 3.0*   Recent Labs  Lab 06/28/20 1313  LIPASE 36   No results for input(s): AMMONIA in the last 168 hours. Coagulation Profile: No results for input(s): INR, PROTIME in the last 168 hours. Cardiac Enzymes: No results for input(s): CKTOTAL, CKMB, CKMBINDEX, TROPONINI in the last 168 hours. BNP (last 3 results) No results for input(s): PROBNP in the last 8760 hours. HbA1C: No results for input(s): HGBA1C in the last 72 hours. CBG: No results for input(s): GLUCAP in the last 168 hours. Lipid Profile: No results for input(s): CHOL, HDL, LDLCALC, TRIG, CHOLHDL, LDLDIRECT in the last 72 hours. Thyroid Function Tests: No results for input(s): TSH, T4TOTAL, FREET4, T3FREE, THYROIDAB in the last 72 hours. Anemia Panel: No results for input(s): VITAMINB12, FOLATE, FERRITIN, TIBC, IRON, RETICCTPCT in the last 72 hours. Urine analysis:    Component Value Date/Time   COLORURINE STRAW (A) 11/22/2019 0919   APPEARANCEUR CLEAR 11/22/2019 0919   LABSPEC 1.008 11/22/2019 0919   PHURINE 6.0 11/22/2019 0919   GLUCOSEU NEGATIVE 11/22/2019 0919   HGBUR NEGATIVE 11/22/2019 0919   BILIRUBINUR NEGATIVE 11/22/2019 0919   KETONESUR NEGATIVE 11/22/2019 0919   PROTEINUR NEGATIVE 11/22/2019 0919   NITRITE NEGATIVE 11/22/2019 0919   LEUKOCYTESUR NEGATIVE 11/22/2019 0919   Sepsis Labs: @LABRCNTIP (procalcitonin:4,lacticidven:4) ) Recent Results (from the past 240 hour(s))  SARS Coronavirus 2 by RT PCR (hospital order, performed in [facility] hospital lab) Nasopharyngeal Nasopharyngeal Swab     Status: None   Collection Time: 06/19/20  3:23 PM   Specimen:  Nasopharyngeal Swab  Result Value Ref Range Status   SARS Coronavirus 2 NEGATIVE NEGATIVE Final    Comment: (NOTE) SARS-CoV-2 target nucleic acids are NOT DETECTED.  The SARS-CoV-2 RNA is generally detectable in upper and lower respiratory specimens during the acute phase of infection. The lowest  concentration of SARS-CoV-2 viral copies this assay can detect is 250 copies / mL. A negative result does not preclude SARS-CoV-2 infection and should not be used as the sole basis for treatment or other patient management decisions.  A negative result may occur with improper specimen collection / handling, submission of specimen other than nasopharyngeal swab, presence of viral mutation(s) within the areas targeted by this assay, and inadequate number of viral copies (<250 copies / mL). A negative result must be combined with clinical observations, patient history, and epidemiological information.  Fact Sheet for Patients:   StrictlyIdeas.no  Fact Sheet for Healthcare Providers: BankingDealers.co.za  This test is not yet approved or  cleared by the [place] FDA and has been authorized for detection and/or diagnosis of SARS-CoV-2 by FDA under an Emergency Use Authorization (EUA).  This EUA will remain in effect (meaning this test can be used) for the duration of the COVID-19 declaration under Section 564(b)(1) of the Act, 21 U.S.C. section 360bbb-3(b)(1), unless the authorization is terminated or revoked sooner.  Performed at [facility], [place] [address] [place], [place] [address]   Aerobic/Anaerobic Culture (surgical/deep wound)     Status: Abnormal   Collection Time: 06/19/20  7:22 PM   Specimen: Abscess  Result Value Ref Range Status   Specimen Description   Final    ABSCESS TESTICULAR Performed at [facility] [address] [place], [place] [address]    Special Requests   Final     NONE Performed at [facility], [place] [address], [place], [place] [address]    Gram Stain   Final    RARE WBC PRESENT, PREDOMINANTLY MONONUCLEAR MODERATE GRAM POSITIVE COCCI IN PAIRS MODERATE GRAM VARIABLE ROD Performed at [facility], [place] [address] [place], [place] [address]    Culture (A)  Final    MULTIPLE ORGANISMS PRESENT, NONE PREDOMINANT MIXED ANAEROBIC FLORA PRESENT.  CALL LAB IF FURTHER IID REQUIRED.    Report Status 06/23/2020 FINAL  Final     Radiological Exams on Admission: CT Abdomen Pelvis Wo Contrast  Result Date: 06/28/2020 CLINICAL DATA:  Abdominal distension. History of colon cancer. EXAM: CT ABDOMEN AND PELVIS WITHOUT CONTRAST TECHNIQUE: Multidetector CT imaging of the abdomen and pelvis was performed following the standard protocol without IV contrast. COMPARISON:  Apr 22, 2020 FINDINGS: Lower chest: Mild bibasilar atelectasis. Hepatobiliary: Scattered hepatic calcifications. Normal decompressed gallbladder. Pancreas: Unremarkable. No pancreatic ductal dilatation or surrounding inflammatory changes. Spleen: Normal in size without focal abnormality. Adrenals/Urinary Tract: Status post left nephrectomy. Normal adrenal glands. Mild right hydronephrosis and proximal hydroureter. The urinary bladder has normal appearance. Although enlarged prostate compresses the base of the urinary bladder. Stomach/Bowel: Moderate fluid distension of the stomach. Gastrostomy tube in expected position. Diffuse fluid-filled distended small bowels throughout the abdomen to the level of the Hartman pouch in the central pelvis. The Hartmann's pouch in the lower central pelvis demonstrates increased associated soft tissue thickening. Left-sided colostomy has stable appearance. Vascular/Lymphatic: Aortic atherosclerosis. 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 Shotty retroperitoneal lymph nodes, sub pathologic by CT criteria. Reproductive:  Enlarged prostate gland with coarse calcifications. Other: Subcutaneous fat stranding of the lower abdominal wall extending to the pelvis and scrotum. Musculoskeletal: No definite metastatic disease noted. IMPRESSION: 1. Diffuse fluid-filled distended (up to 5 cm) small bowels throughout the abdomen to the level of the Hartman's pouch in the lower central pelvis. This represents a severe small bowel obstruction. 2.  The Hartmann's pouch in the lower central pelvis demonstrates increased associated soft tissue thickening, consistent with local progression of disease, likely involving the nearby distal ileum, which serves as the transitional point of the small bowel obstruction. 3. Mild right hydronephrosis and proximal hydroureter. 4. Subcutaneous fat stranding of the lower abdominal wall extending to the pelvis and scrotum, which may represent anasarca or cellulitis. 5. 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 6. Status post left nephrectomy. 7. Enlarged prostate gland with coarse calcifications which causes mass effect to the base of the urinary bladder. 8. Aortic atherosclerosis. Aortic Atherosclerosis (ICD10-I70.0). Electronically Signed   By: Fidela Salisbury M.D.   On: 06/28/2020 18:25    EKG: Independently reviewed.  Sinus rhythm with no significant ST changes  Assessment/Plan Principal Problem:   SBO (small bowel obstruction) adhesions to pelvic recurrence Active Problems:   GERD (gastroesophageal reflux disease)   Smoking greater than 40 pack years   Metastatic Rectal cancer [id number]    History of seizures   CKD (chronic kidney disease) stage 3, GFR 30-59 ml/min   HOH (hard of hearing)     #1  Small bowel obstruction versus ostomy tube blockage: Patient has NG tube inserted.  Admitted for conservative treatment.  IV fluids pain management as well as surgical consultation in the morning.  #2 GERD: Continue with PPIs.  #3 chronic kidney disease stage  III: Continue to monitor renal function.  Appears to be close to baseline with mild worsening.  #4 metastatic renal cancer: Status post surgery.  Continue follow-up with oncology.  #5 hard of hearing: Patient he has very well in the left ear compared to the right ear.  #6 history of seizures: Continue treatment     DVT prophylaxis: Lovenox Code Status: Full code Family Communication: Patient has no family at bedside Disposition Plan: Home Consults called: Surgical consult in the morning Admission status: Inpatient  Severity of Illness: The appropriate patient status for this patient is INPATIENT. Inpatient status is judged to be reasonable and necessary in order to provide the required intensity of service to ensure the patient's safety. The patient's presenting symptoms, physical exam findings, and initial radiographic and laboratory data in the context of their chronic comorbidities is felt to place them at high risk for further clinical deterioration. Furthermore, it is not anticipated that the patient will be medically stable for discharge from the hospital within 2 midnights of admission. The following factors support the patient status of inpatient.   " The patient's presenting symptoms include nausea vomiting abdominal pain. " The worrisome physical exam findings include abdominal distention with no ostomy output. " The initial radiographic and laboratory data are worrisome because of suspected gastric outlet obstruction. " The chronic co-morbidities include history of an rectal cancer.   * I certify that at the point of admission it is my clinical judgment that the patient will require inpatient hospital care spanning beyond 2 midnights from the point of admission due to high intensity of service, high risk for further deterioration and high frequency of surveillance required.Barbette Merino MD [facility] Pager [phone]  If 7PM-7AM, please contact  night-coverage [email or web address] Password [facility]  06/28/2020, 7:33 PM

## 2020-06-28 NOTE — ED Notes (Signed)
Attempted to call report, no answer

## 2020-06-28 NOTE — ED Provider Notes (Addendum)
[facility] DEPT Provider Note   CSN: [id number] Arrival date & time: 06/28/20  1054     History Chief Complaint  Patient presents with  . Abdominal Pain    Stephan Nelis is a 79 y.o. male.  Patient with hx recurrent colorectal ca, APR w colostomy, recent sbo with placement venting gastrostomy tube, presents with increased abd distension and discomfort in past 2 days. Symptoms acute onset, moderate-severe, persistent. +nausea, and decreased appetite. Denies vomiting. States placed gastrostomy to drain 3x/day since placement and that only a tablespoon or so of liquid will drain. Is intermittently eating, but notes decreased appetite, with very poor po intake, nausea, in past few days. Denies fever or chills. Notes no new stool or gas in ostomy bag in past day. 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   The history is provided by the patient and a relative.  Abdominal Pain Associated symptoms: nausea   Associated symptoms: no chest pain, no chills, no cough, no dysuria, no fever, no shortness of breath and no sore throat        Past Medical History:  Diagnosis Date  . Arthritis    hand, knee- ? Dx.  Marland Kitchen BPH (benign prostatic hyperplasia)   . 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 Cataract    removed  . GERD (gastroesophageal reflux disease)   . Glaucoma   . Hearing loss   . Rectal adenocarcinoma ([place])   . Renal cancer ([place])   . Seizures ([place])    resolved- daughter states she didnt know he ever had seizures    Patient Active Problem List   Diagnosis Date Noted  . 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 Testicular abscess 06/19/2020  . Small bowel obstruction ([place]) 04/22/2020  . Antineoplastic chemotherapy regimen declined 04/22/2020  . Constipation, chronic 04/22/2020    . Abdominal pain 04/22/2020  . Goals of care, counseling/discussion 04/01/2020  . Hyperglycemia 10/21/2019  . History of Noncompliance with treatment plan 10/20/2019  . 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 Anemia of chronic disease 10/20/2019  . HOH (hard of hearing) 10/20/2019  . History of seizures   . Glaucoma   . BPH (benign prostatic hyperplasia)   . 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 Metastatic Rectal cancer [id number]  10/18/2019  . 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 GERD (gastroesophageal reflux disease) 03/28/2017  . 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 SBO (small bowel obstruction) adhesions to pelvic recurrence 03/26/2017    Past Surgical History:  Procedure Laterality Date  . APPENDECTOMY    . COLONOSCOPY    . COLOSTOMY REVISION N/A 10/18/2019   Procedure: OSTOMY REVISION;  Surgeon: Leighton Ruff, MD;  Location: [facility];  Service: General;  Laterality: N/A;  . CYSTOSCOPY WITH STENT PLACEMENT Bilateral 10/18/2019   Procedure: CYSTOSCOPY WITH FIREFLY INJECTION;  Surgeon: Cleon Gustin, MD;  Location: [facility];  Service: Urology;  Laterality: Bilateral;  . IR GASTROSTOMY TUBE MOD SED  04/24/2020  . PARTIAL COLECTOMY N/A 03/28/2017   Procedure: PARTIAL COLECTOMY/COLOSTOMY;  Surgeon: Georganna Skeans, MD;  Location: [facility];  Service: General;  Laterality: N/A;  Partial Colectomy, Mobilization of Splenic Flexure, Colostomy  . PARTIAL NEPHRECTOMY Left 03/28/2017   Procedure: NEPHRECTOMY TOTAL;  Surgeon: Raynelle Bring, MD;  Location:  [facility];  Service: Urology;  Laterality: Left;  . SCROTAL EXPLORATION Right 06/19/2020   Procedure: SCROTUM EXPLORATION,  RIGHT SIMPLE ORCHIECTOMY;  Surgeon: Remi Haggard, MD;  Location: [facility];  Service: Urology;  Laterality: Right;  . XI ROBOTIC ASSISTED LOWER ANTERIOR RESECTION N/A 10/18/2019   Procedure: XI ROBOTIC ASSISTED  LOWER ANTERIOR RESECTION WITH PERISTOMAL HERNIA REPAIR WITH MESH ( SUGAR BAKER);  Surgeon: Leighton Ruff, MD;  Location: [facility];  Service: General;  Laterality: N/A;       Family History  Problem Relation Age of Onset  . Esophageal cancer Paternal Uncle   . Colon polyps Daughter   . Colon cancer Neg Hx   . Rectal cancer Neg Hx   . Stomach cancer Neg Hx     Social History   Tobacco Use  . Smoking status: Former Smoker    Packs/day: 1.00    Years: 60.00    Pack years: 60.00    Types: Cigarettes    Quit date: 03/14/2017    Years since quitting: 3.2  . Smokeless tobacco: Never Used  Vaping Use  . Vaping Use: Never used  Substance Use Topics  . Alcohol use: No  . Drug use: No    Home Medications Prior to Admission medications   Medication Sig Start Date End Date Taking? Authorizing Provider  amoxicillin-clavulanate (AUGMENTIN) 875-125 MG tablet Take 1 tablet by mouth 2 (two) times daily for 10 days. Patient taking differently: Take 1 tablet by mouth 2 (two) times daily. Start date :06/22/20 06/22/20 07/02/20 Yes Patrecia Pour, MD  bisacodyl (DULCOLAX) 5 MG EC tablet Take 5 mg by mouth daily as needed for moderate constipation.   Yes [provider]  cholecalciferol (VITAMIN D3) 25 MCG (1000 UNIT) tablet Take 1,000 Units by mouth daily.   Yes [provider]  [employer] PURELAX 17 g packet Take 1 packet by mouth daily as needed for moderate constipation.  04/25/20  Yes [provider]  DIFLUCAN 100 MG tablet Take 100 mg by mouth daily. 06/18/20  Yes [provider]  HYDROcodone-acetaminophen (NORCO/VICODIN) 5-325 MG tablet Take 0.5-1 tablets by mouth every 4 (four) hours as needed for moderate pain or severe pain.  06/18/20  Yes [provider]  ibuprofen (ADVIL) 200 MG tablet Take 200 mg by mouth every 6 (six) hours as needed for moderate pain.    Yes [provider]  latanoprost (XALATAN) 0.005 % ophthalmic solution Place 1 drop into both  eyes at bedtime.  12/27/18  Yes [provider]  magnesium citrate SOLN Take 0.5 Bottles by mouth as needed for severe constipation.   Yes [provider]  ondansetron (ZOFRAN) 4 MG tablet Take 4 mg by mouth every 8 (eight) hours as needed for nausea or vomiting.  04/25/20  Yes [provider]  tamsulosin (FLOMAX) 0.4 MG CAPS capsule Take 0.8 mg by mouth at bedtime.  02/03/19  Yes [provider]  docusate sodium (COLACE) 100 MG capsule Take 100 mg by mouth 2 (two) times daily as needed for mild constipation.  Patient not taking: Reported on 06/28/2020    [provider]  loperamide (IMODIUM) 2 MG capsule Take 2 capsules (4 mg total) by mouth 4 (four) times daily as needed for diarrhea or loose stools. Patient not taking: Reported on 06/28/2020 05/19/20   Ladell Pier, MD    Allergies    Patient has no known allergies.  Review of Systems   Review of Systems  Constitutional: Negative  for chills and fever.  HENT: Negative for sore throat.   Eyes: Negative for redness.  Respiratory: Negative for cough and shortness of breath.   Cardiovascular: Negative for chest pain.  Gastrointestinal: Positive for abdominal distention, abdominal pain and nausea.  Endocrine: Negative for polyuria.  Genitourinary: Negative for dysuria and flank pain.  Musculoskeletal: Negative for back pain and neck pain.  Skin: Negative for rash.  Neurological: Negative for headaches.  Hematological: Does not bruise/bleed easily.  Psychiatric/Behavioral: Negative for confusion.    Physical Exam Updated Vital Signs BP 115/69   Pulse 80   Temp 98.2 F (36.8 C) (Oral)   Resp 16   Ht 1.753 m (5\' 9" )   Wt 73.1 kg   SpO2 96%   BMI 23.79 kg/m   Physical Exam Vitals and nursing note reviewed.  Constitutional:      Appearance: Normal appearance. He is well-developed.  HENT:     Head: Atraumatic.     Nose: Nose normal.     Mouth/Throat:     Mouth: Mucous membranes are  moist.     Pharynx: Oropharynx is clear.  Eyes:     General: No scleral icterus.    Conjunctiva/sclera: Conjunctivae normal.     Pupils: Pupils are equal, round, and reactive to light.  Neck:     Trachea: No tracheal deviation.  Cardiovascular:     Rate and Rhythm: Normal rate and regular rhythm.     Pulses: Normal pulses.     Heart sounds: Normal heart sounds. No murmur heard.  No friction rub. No gallop.   Pulmonary:     Effort: Pulmonary effort is normal. No accessory muscle usage or respiratory distress.     Breath sounds: Normal breath sounds.  Abdominal:     General: Bowel sounds are normal. There is distension.     Palpations: Abdomen is soft.     Tenderness: There is abdominal tenderness. There is no guarding.     Comments: Mild diffuse tenderness. No incarcerated hernia. Ostomy, pink, patent, with brown stool in bag. Gastrostomy tube without sign of infection.   Genitourinary:    Comments: No cva tenderness. Right scrotal surgical wound clean, dressing intact, no purulent drainage or malodor, no necrotic or devitalized tissue.  Musculoskeletal:        General: No swelling or tenderness.     Cervical back: Normal range of motion and neck supple. No rigidity.  Skin:    General: Skin is warm and dry.     Findings: No rash.  Neurological:     Mental Status: He is alert.     Comments: Alert, speech clear.   Psychiatric:        Mood and Affect: Mood normal.      ED Results / Procedures / Treatments   Labs (all labs ordered are listed, but only abnormal results are displayed) Results for orders placed or performed during the hospital encounter of 06/28/20  CBC with Differential  Result Value Ref Range   WBC 10.6 (H) 4.0 - 10.5 K/uL   RBC 3.43 (L) 4.22 - 5.81 MIL/uL   Hemoglobin 8.8 (L) 13.0 - 17.0 g/dL   HCT 28.7 (L) 39 - 52 %   MCV 83.7 80.0 - 100.0 fL   MCH 25.7 (L) 26.0 - 34.0 pg   MCHC 30.7 30.0 - 36.0 g/dL   RDW 18.0 (H) 11.5 - 15.5 %   Platelets 483 (H) 150  - 400 K/uL   nRBC 0.0 0.0 - 0.2 %  Neutrophils Relative % 80 %   Neutro Abs 8.6 (H) 1.7 - 7.7 K/uL   Lymphocytes Relative 9 %   Lymphs Abs 0.9 0.7 - 4.0 K/uL   Monocytes Relative 9 %   Monocytes Absolute 1.0 0 - 1 K/uL   Eosinophils Relative 1 %   Eosinophils Absolute 0.1 0 - 0 K/uL   Basophils Relative 0 %   Basophils Absolute 0.0 0 - 0 K/uL   Immature Granulocytes 1 %   Abs Immature Granulocytes 0.06 0.00 - 0.07 K/uL  Comprehensive metabolic panel  Result Value Ref Range   Sodium 134 (L) 135 - 145 mmol/L   Potassium 5.6 (H) 3.5 - 5.1 mmol/L   Chloride 93 (L) 98 - 111 mmol/L   CO2 29 22 - 32 mmol/L   Glucose, Bld 95 70 - 99 mg/dL   BUN 30 (H) 8 - 23 mg/dL   Creatinine, Ser 1.87 (H) 0.61 - 1.24 mg/dL   Calcium 9.6 8.9 - 10.3 mg/dL   Total Protein 7.1 6.5 - 8.1 g/dL   Albumin 3.0 (L) 3.5 - 5.0 g/dL   AST 19 15 - 41 U/L   ALT 21 0 - 44 U/L   Alkaline Phosphatase 96 38 - 126 U/L   Total Bilirubin 0.5 0.3 - 1.2 mg/dL   GFR calc non Af Amer 33 (L) >60 mL/min   GFR calc Af Amer 38 (L) >60 mL/min   Anion gap 12 5 - 15  Lipase, blood  Result Value Ref Range   Lipase 36 11 - 51 U/L   CT Abdomen Pelvis Wo Contrast  Result Date: 06/28/2020 CLINICAL DATA:  Abdominal distension. History of colon cancer. EXAM: CT ABDOMEN AND PELVIS WITHOUT CONTRAST TECHNIQUE: Multidetector CT imaging of the abdomen and pelvis was performed following the standard protocol without IV contrast. COMPARISON:  Apr 22, 2020 FINDINGS: Lower chest: Mild bibasilar atelectasis. Hepatobiliary: Scattered hepatic calcifications. Normal decompressed gallbladder. Pancreas: Unremarkable. No pancreatic ductal dilatation or surrounding inflammatory changes. Spleen: Normal in size without focal abnormality. Adrenals/Urinary Tract: Status post left nephrectomy. Normal adrenal glands. Mild right hydronephrosis and proximal hydroureter. The urinary bladder has normal appearance. Although enlarged prostate compresses the base of  the urinary bladder. Stomach/Bowel: Moderate fluid distension of the stomach. Gastrostomy tube in expected position. Diffuse fluid-filled distended small bowels throughout the abdomen to the level of the Hartman pouch in the central pelvis. The Hartmann's pouch in the lower central pelvis demonstrates increased associated soft tissue thickening. Left-sided colostomy has stable appearance. Vascular/Lymphatic: Aortic atherosclerosis. 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 Shotty retroperitoneal lymph nodes, sub pathologic by CT criteria. Reproductive: Enlarged prostate gland with coarse calcifications. Other: Subcutaneous fat stranding of the lower abdominal wall extending to the pelvis and scrotum. Musculoskeletal: No definite metastatic disease noted. IMPRESSION: 1. Diffuse fluid-filled distended (up to 5 cm) small bowels throughout the abdomen to the level of the Hartman's pouch in the lower central pelvis. This represents a severe small bowel obstruction. 2. The Hartmann's pouch in the lower central pelvis demonstrates increased associated soft tissue thickening, consistent with local progression of disease, likely involving the nearby distal ileum, which serves as the transitional point of the small bowel obstruction. 3. Mild right hydronephrosis and proximal hydroureter. 4. Subcutaneous fat stranding of the lower abdominal wall extending to the pelvis and scrotum, which may represent anasarca or cellulitis. 5. 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 6. Status post left nephrectomy. 7. Enlarged prostate  gland with coarse calcifications which causes mass effect to the base of the urinary bladder. 8. Aortic atherosclerosis. Aortic Atherosclerosis (ICD10-I70.0). Electronically Signed   By: Fidela Salisbury M.D.   On: 06/28/2020 18:25    EKG None  Radiology CT Abdomen Pelvis Wo Contrast  Result Date: 06/28/2020 CLINICAL DATA:   Abdominal distension. History of colon cancer. EXAM: CT ABDOMEN AND PELVIS WITHOUT CONTRAST TECHNIQUE: Multidetector CT imaging of the abdomen and pelvis was performed following the standard protocol without IV contrast. COMPARISON:  Apr 22, 2020 FINDINGS: Lower chest: Mild bibasilar atelectasis. Hepatobiliary: Scattered hepatic calcifications. Normal decompressed gallbladder. Pancreas: Unremarkable. No pancreatic ductal dilatation or surrounding inflammatory changes. Spleen: Normal in size without focal abnormality. Adrenals/Urinary Tract: Status post left nephrectomy. Normal adrenal glands. Mild right hydronephrosis and proximal hydroureter. The urinary bladder has normal appearance. Although enlarged prostate compresses the base of the urinary bladder. Stomach/Bowel: Moderate fluid distension of the stomach. Gastrostomy tube in expected position. Diffuse fluid-filled distended small bowels throughout the abdomen to the level of the Hartman pouch in the central pelvis. The Hartmann's pouch in the lower central pelvis demonstrates increased associated soft tissue thickening. Left-sided colostomy has stable appearance. Vascular/Lymphatic: Aortic atherosclerosis. 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 Shotty retroperitoneal lymph nodes, sub pathologic by CT criteria. Reproductive: Enlarged prostate gland with coarse calcifications. Other: Subcutaneous fat stranding of the lower abdominal wall extending to the pelvis and scrotum. Musculoskeletal: No definite metastatic disease noted. IMPRESSION: 1. Diffuse fluid-filled distended (up to 5 cm) small bowels throughout the abdomen to the level of the Hartman's pouch in the lower central pelvis. This represents a severe small bowel obstruction. 2. The Hartmann's pouch in the lower central pelvis demonstrates increased associated soft tissue thickening, consistent with local progression of disease, likely involving the nearby distal  ileum, which serves as the transitional point of the small bowel obstruction. 3. Mild right hydronephrosis and proximal hydroureter. 4. Subcutaneous fat stranding of the lower abdominal wall extending to the pelvis and scrotum, which may represent anasarca or cellulitis. 5. 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 6. Status post left nephrectomy. 7. Enlarged prostate gland with coarse calcifications which causes mass effect to the base of the urinary bladder. 8. Aortic atherosclerosis. Aortic Atherosclerosis (ICD10-I70.0). Electronically Signed   By: Fidela Salisbury M.D.   On: 06/28/2020 18:25    Procedures Procedures (including critical care time)  Medications Ordered in ED Medications  sodium chloride 0.9 % bolus 1,000 mL (0 mLs Intravenous Stopped 06/28/20 1758)    ED Course  I have reviewed the triage vital signs and the nursing notes.  Pertinent labs & imaging results that were available during my care of the patient were reviewed by me and considered in my medical decision making (see chart for details).    MDM Rules/Calculators/A&P                          Iv ns bolus. zofran iv. Morphine iv.  Stat labs. Imaging ordered.   MDM Number of Diagnoses or Management Options   Amount and/or Complexity of Data Reviewed Clinical lab tests: ordered and reviewed Tests in the radiology section of CPT: ordered and reviewed Tests in the medicine section of CPT: ordered and reviewed Discussion of test results with the performing providers: yes Decide to obtain previous medical records or to obtain history from someone other than the patient: yes Obtain history from someone other  than the patient: yes Review and summarize past medical records: yes Discuss the patient with other providers: yes Independent visualization of images, tracings, or specimens: yes  Risk of Complications, Morbidity, and/or Mortality Presenting problems: high Diagnostic  procedures: high Management options: high    Reviewed nursing notes and prior charts for additional history.  Note made of hospice care, prior records also reviewed - when asked patient/family what they were told to expect given existing disease/progression of disease, they indicate are not sure, without clear understanding of goals of care and/or hospice care.   Labs reviewed/interprted by me - CRI. k is elevated. Ns bolus.   CT reviewed/interpreted by me - sbo. Will place patients existing gastrostomy tube to LIWS.  Given pain, nausea, high k, worsening/severe sbo - will admit. ?whether patient will be transitioned to inpatient/home hospice from there.   Hospitalists consulted for admission - discussed pt with Dr Jonelle Sidle - will admit.      Final Clinical Impression(s) / ED Diagnoses Final diagnoses:  None    Rx / DC Orders ED Discharge Orders    None            Lajean Saver, MD 06/28/20 1949

## 2020-06-28 NOTE — ED Notes (Signed)
Patient transported to CT 

## 2020-06-28 NOTE — Plan of Care (Signed)
  Problem: Education: Goal: Knowledge of General Education information will improve Description: Including pain rating scale, medication(s)/side effects and non-pharmacologic comfort measures Outcome: Progressing   Problem: Clinical Measurements: Goal: Will remain free from infection Outcome: Progressing   Problem: Nutrition: Goal: Adequate nutrition will be maintained Outcome: Progressing   Problem: Coping: Goal: Level of anxiety will decrease Outcome: Progressing   Problem: Elimination: Goal: Will not experience complications related to urinary retention Outcome: Progressing   Problem: Pain Managment: Goal: General experience of comfort will improve Outcome: Progressing

## 2020-06-28 NOTE — ED Triage Notes (Signed)
He I isn't so much in pain per se as he is "uncomfortably swollen". His abd. Is significantly distended and tight. He is, nevertheless ambulatory and in no distress. He has a left abd. Colostomy x 3 years placed by Dr. Grandville Silos "for colon cancer". He states he has been distended for a week "ever since I had a testicle removed a week ago".

## 2020-06-28 NOTE — ED Notes (Signed)
Pt returned from CT °

## 2020-06-29 ENCOUNTER — Inpatient Hospital Stay (HOSPITAL_COMMUNITY)

## 2020-06-29 ENCOUNTER — Encounter (HOSPITAL_COMMUNITY): Payer: Self-pay | Admitting: Internal Medicine

## 2020-06-29 DIAGNOSIS — C189 Malignant neoplasm of colon, unspecified: Secondary | ICD-10-CM | POA: Diagnosis present

## 2020-06-29 LAB — COMPREHENSIVE METABOLIC PANEL
ALT: 17 U/L (ref 0–44)
AST: 16 U/L (ref 15–41)
Albumin: 2.3 g/dL — ABNORMAL LOW (ref 3.5–5.0)
Alkaline Phosphatase: 68 U/L (ref 38–126)
Anion gap: 9 (ref 5–15)
BUN: 43 mg/dL — ABNORMAL HIGH (ref 8–23)
CO2: 27 mmol/L (ref 22–32)
Calcium: 8.5 mg/dL — ABNORMAL LOW (ref 8.9–10.3)
Chloride: 99 mmol/L (ref 98–111)
Creatinine, Ser: 1.48 mg/dL — ABNORMAL HIGH (ref 0.61–1.24)
GFR calc Af Amer: 51 mL/min — ABNORMAL LOW (ref >60)
GFR calc non Af Amer: 44 mL/min — ABNORMAL LOW (ref >60)
Glucose, Bld: 104 mg/dL — ABNORMAL HIGH (ref 70–99)
Potassium: 4.6 mmol/L (ref 3.5–5.1)
Sodium: 135 mmol/L (ref 135–145)
Total Bilirubin: 0.4 mg/dL (ref 0.3–1.2)
Total Protein: 5.6 g/dL — ABNORMAL LOW (ref 6.5–8.1)

## 2020-06-29 LAB — CBC
HCT: 21.2 % — ABNORMAL LOW (ref 39.0–52.0)
Hemoglobin: 6.7 g/dL — CL (ref 13.0–17.0)
MCH: 25.7 pg — ABNORMAL LOW (ref 26.0–34.0)
MCHC: 31.6 g/dL (ref 30.0–36.0)
MCV: 81.2 fL (ref 80.0–100.0)
Platelets: 353 10*3/uL (ref 150–400)
RBC: 2.61 MIL/uL — ABNORMAL LOW (ref 4.22–5.81)
RDW: 17.9 % — ABNORMAL HIGH (ref 11.5–15.5)
WBC: 7 10*3/uL (ref 4.0–10.5)
nRBC: 0 % (ref 0.0–0.2)

## 2020-06-29 LAB — PREPARE RBC (CROSSMATCH)

## 2020-06-29 MED ORDER — SODIUM CHLORIDE 0.9% IV SOLUTION: Freq: Once | INTRAVENOUS | Status: AC

## 2020-06-29 MED ORDER — FAMOTIDINE IN NACL 20-0.9 MG/50ML-% IV SOLN
20.0000 mg | INTRAVENOUS | Status: DC
  Administered 2020-06-29 – 2020-06-30 (×2): 20 mg via INTRAVENOUS
  Filled 2020-06-29 (×2): qty 50

## 2020-06-29 MED ORDER — SODIUM CHLORIDE 0.9 % IV SOLN
3.0000 g | Freq: Four times a day (QID) | INTRAVENOUS | Status: DC
  Administered 2020-06-29 – 2020-06-30 (×5): 3 g via INTRAVENOUS
  Filled 2020-06-29 (×2): qty 8
  Filled 2020-06-29: qty 3
  Filled 2020-06-29: qty 8
  Filled 2020-06-29: qty 3
  Filled 2020-06-29: qty 8

## 2020-06-29 NOTE — Progress Notes (Signed)
   Percutaneous gastric tube for venting was placed in IR 04/24/20  Pt has come to ED with abd distension and pain complaints  Evaluated with Dr Harlow Asa today He is requesting G tube evaluation and possible exchange if needed.  Scheduled for 7/19 in IR  Pt is aware and agreeable Dtr [name] is aware and agreeable Consent signed and in chart

## 2020-06-29 NOTE — Progress Notes (Addendum)
PROGRESS NOTE    Avraham Benish  RCV:[id number] DOB: 17-Aug-1941 DOA: 06/28/2020 PCP: [name]   Chief Complaint  Patient presents with  . Abdominal Pain    Brief Narrative:  Osiel Stick is Wilberth Damon 79 y.o. 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  Since he had his gastrostomy tube placed he has been drainage at least 3 times K[place] Trim day.  Lately however only Kallin Henk tablespoon or very little was draining.  Patient has had intermittent eating but has had significant loss of appetite.  He was seen in the ER and evaluated.  Found to have significant small bowel obstruction and is being admitted for treatment.  Patient is hard of hearing but awake alert and able to communicate.  He denied any fever or chills denied any hematemesis no melena.  Denied any blood in his ostomy bag.  He has been admitted for treatment of small bowel obstruction..  ED Course: Vitals appear stable overall.  White count 10.6 hemoglobin 8.8 and platelets 483.  Sodium 134 potassium 5.6 chloride 93 CO2 29 BUN 30 creatinine 1.87 and glucose 95.  Abdominal x-ray indicated distention around the gastric area consistent with small bowel obstruction.  NG tube inserted.  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   Metastatic Rectal cancer [id number]    History of seizures   CKD (chronic kidney disease) stage 3, GFR 30-59 ml/min   HOH (hard of hearing)   Primary colon cancer with metastasis to other site [facility])  #1  SBO  Hx Colorectal  Cancer s/p Colectomy and Colostomy 2018  S/p venting gastrostomy tube 04/2020:  Decreased output from gtube CT with diffuse fluid filled distended small bowels to level of hartman's pouch c/w severe SBO Surgery c/s, appreciate recs - recommending contrast study of g tube by IR - notes gtube may need to remain on continuous dependent drainage instead of intermittent drainage NG to LIS IR c/s, appreciate recs, plan for g tube evaluation   # Fournier's Gangrene: s/p right orchiectomy, scotum exploration and debridement on 7/8 Augmentin x 2 weeks per urology Needs daily wet to dry dressing changes per previous note  CT from admission with subcutaneous fat stranding of lower abdominal wall extending to pelvis/scrotum (anasarca vs cellulitis) - no obvious cellulitis to lower abdomen on exam today, wound is well appearing  Transition to unasyn while NG in place  # AKI on CKD IIIa: baseline creatinine appears to be ~ 1.2-3.  Recently creatinine as high as 2 during last admission.  Downtrending today.  ? New baseline Mild right hydronephrosis and proximal hydroureter (mod hydro and hydroureter was seen on 04/2020 scan) - will touch base with urology for need for f/u  # Anemia: hb down to 6.7 today.  Discussed transfusion and he's currently refusing this.  Will continue to discuss.  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  Continue follow-up with oncology.  #5 hard of hearing: Patient he has very well in the left ear compared to the right ear.  #6 history of seizures: Continue treatment  #  Goals of Care: he's followed by hospice.  Code status at this admit is full and previous admits was full.  Note from 5/14 from oncology notes they were agreeable to DNR, but has been admitted as full code multiple times since then.  Discussed with pt today and noncommittal.  Will discuss with pt and family.  Discussed with pt and daughter, they note desire for full code.  DVT  prophylaxis: SCD Code Status: full  Family Communication: daughter at bedside Disposition:   Status is: Inpatient  Remains inpatient appropriate because:Inpatient level of care appropriate due to severity of illness   Dispo: The patient is from: Home              Anticipated d/c is to: Home              Anticipated d/c date is: 2 days              Patient currently is not medically stable to d/c.  Consultants:   Surgery  IR  Procedures:   none  Antimicrobials: ( Anti-infectives (From admission, onward)   Start     Dose/Rate Route Frequency Ordered Stop   06/29/20 1400  Ampicillin-Sulbactam (UNASYN) 3 g in sodium chloride 0.9 % 100 mL IVPB     Discontinue     3 g 200 mL/hr over 30 Minutes Intravenous Every 6 hours 06/29/20 1229     06/28/20 2200  fluconazole (DIFLUCAN) tablet 100 mg     Discontinue     100 mg Oral Daily 06/28/20 2158     06/28/20 2200  amoxicillin-clavulanate (AUGMENTIN) 875-125 MG per tablet 1 tablet  Status:  Discontinued       Note to Pharmacy: Patient taking differently: Start date :06/22/20     1 tablet Oral 2 times daily 06/28/20 2158 06/29/20 1224     Subjective: Doesn't want blood Feels this made his taste different Wants to get home  Objective: Vitals:   06/29/20 0148 06/29/20 0544 06/29/20 1023 06/29/20 1448  BP: (!) 106/59 (!) 109/55 105/61 (!) 114/59  Pulse: 66 64 62 63  Resp: 16 16 20 20   Temp: 98.1 F (36.7 C) 97.7 F (36.5 C) 98.4 F (36.9 C) 98.5 F (36.9 C)  TempSrc: Oral Oral Oral Oral  SpO2: 94% 95% 95% 98%  Weight:      Height:        Intake/Output Summary (Last 24 hours) at 06/29/2020 1559 Last data filed at 06/29/2020 1155 Gross per 24 hour  Intake 1786.78 ml  Output 2050 ml  Net -263.22 ml   Filed Weights   06/28/20 1129  Weight: 73.1 kg    Examination:  General: No acute distress. Cardiovascular: Heart sounds show Lisaanne Lawrie regular rate, and rhythm Lungs: Clear to auscultation bilaterally Abdomen: Soft,  distended, G tube in place, NG Neurological: Alert and oriented 3. Moves all extremities 4. Cranial nerves II through XII grossly intact. GU: surgical wound well appearing  Skin: Warm and dry. No rashes or lesions. Extremities: No clubbing or cyanosis. No edema.   Data Reviewed: I have personally reviewed following labs and imaging studies  CBC: Recent Labs  Lab 06/28/20 1313 06/29/20 0630  WBC 10.6* 7.0  NEUTROABS 8.6*  --   HGB 8.8* 6.7*  HCT 28.7* 21.2*  MCV 83.7 81.2  PLT 483* 220    Basic Metabolic Panel: Recent Labs  Lab 06/28/20 1313 06/29/20 0630  NA 134* 135  K 5.6* 4.6  CL 93* 99  CO2 29  27  GLUCOSE 95 104*  BUN 30* 43*  CREATININE 1.87* 1.48*  CALCIUM 9.6 8.5*    GFR: Estimated Creatinine Clearance: 39.8 mL/min (Amanpreet Delmont) (by C-G formula based on SCr of 1.48 mg/dL (H)).  Liver Function Tests: Recent Labs  Lab 06/28/20 1313 06/29/20 0630  AST 19 16  ALT 21 17  ALKPHOS 96 68  BILITOT 0.5 0.4  PROT 7.1 5.6*  ALBUMIN 3.0* 2.3*    CBG: No results for input(s): GLUCAP in the last 168 hours.   Recent Results (from the past 240 hour(s))  Aerobic/Anaerobic Culture (surgical/deep wound)     Status: Abnormal   Collection Time: 06/19/20  7:22 PM   Specimen: Abscess  Result Value Ref Range Status   Specimen Description   Final    ABSCESS TESTICULAR Performed at [facility] [address] [place], [place] [address]    Special Requests   Final    NONE Performed at [facility], [place] [address] [place], [place] [address]    Gram Stain   Final    RARE WBC PRESENT, PREDOMINANTLY MONONUCLEAR MODERATE GRAM POSITIVE COCCI IN PAIRS MODERATE GRAM VARIABLE ROD Performed at [facility], [place] [address], [place], [place] [address]    Culture (Genifer Lazenby)  Final    MULTIPLE ORGANISMS PRESENT, NONE PREDOMINANT MIXED ANAEROBIC FLORA PRESENT.  CALL LAB IF FURTHER IID REQUIRED.    Report Status 06/23/2020 FINAL  Final  SARS  Coronavirus 2 by RT PCR (hospital order, performed in [facility] hospital lab) Nasopharyngeal Nasopharyngeal Swab     Status: None   Collection Time: 06/28/20  7:47 PM   Specimen: Nasopharyngeal Swab  Result Value Ref Range Status   SARS Coronavirus 2 NEGATIVE NEGATIVE Final    Comment: (NOTE) SARS-CoV-2 target nucleic acids are NOT DETECTED.  The SARS-CoV-2 RNA is generally detectable in upper and lower respiratory specimens during the acute phase of infection. The lowest concentration of SARS-CoV-2 viral copies this assay can detect is 250 copies / mL. Graison Leinberger negative result does not preclude SARS-CoV-2 infection and should not be used as the sole basis for treatment or other patient management decisions.  Eluterio Seymour negative result may occur with improper specimen collection / handling, submission of specimen other than nasopharyngeal swab, presence of viral mutation(s) within the areas targeted by this assay, and inadequate number of viral copies (<250 copies / mL). Sidnie Swalley negative result must be combined with clinical observations, patient history, and epidemiological information.  Fact Sheet for Patients:   StrictlyIdeas.no  Fact Sheet for Healthcare Providers: BankingDealers.co.za  This test is not yet approved or  cleared by the [place] FDA and has been authorized for detection and/or diagnosis of SARS-CoV-2 by FDA under an Emergency Use Authorization (EUA).  This EUA will remain in effect (meaning this test can be used) for the duration of the COVID-19 declaration under Section 564(b)(1) of the Act, 21 U.S.C. section 360bbb-3(b)(1), unless the authorization is terminated or revoked sooner.  Performed at [facility], [place] [address], [place], [place] [address]          Radiology Studies: CT Abdomen Pelvis Wo Contrast  Result Date: 06/28/2020 CLINICAL DATA:  Abdominal distension. History of colon cancer. EXAM:  CT ABDOMEN AND PELVIS WITHOUT CONTRAST TECHNIQUE: Multidetector CT imaging of the abdomen and pelvis was performed following the standard protocol without IV contrast. COMPARISON:  Apr 22, 2020 FINDINGS: Lower chest: Mild bibasilar atelectasis. Hepatobiliary: Scattered hepatic calcifications. Normal decompressed gallbladder. Pancreas:  Unremarkable. No pancreatic ductal dilatation or surrounding inflammatory changes. Spleen: Normal in size without focal abnormality. Adrenals/Urinary Tract: Status post left nephrectomy. Normal adrenal glands. Mild right hydronephrosis and proximal hydroureter. The urinary bladder has normal appearance. Although enlarged prostate compresses the base of the urinary bladder. Stomach/Bowel: Moderate fluid distension of the stomach. Gastrostomy tube in expected position. Diffuse fluid-filled distended small bowels throughout the abdomen to the level of the Hartman pouch in the central pelvis. The Hartmann's pouch in the lower central pelvis demonstrates increased associated soft tissue thickening. Left-sided colostomy has stable appearance. Vascular/Lymphatic: Aortic atherosclerosis. 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 Shotty retroperitoneal lymph nodes, sub pathologic by CT criteria. Reproductive: Enlarged prostate gland with coarse calcifications. Other: Subcutaneous fat stranding of the lower abdominal wall extending to the pelvis and scrotum. Musculoskeletal: No definite metastatic disease noted. IMPRESSION: 1. Diffuse fluid-filled distended (up to 5 cm) small bowels throughout the abdomen to the level of the Hartman's pouch in the lower central pelvis. This represents Chanell Nadeau severe small bowel obstruction. 2. The Hartmann's pouch in the lower central pelvis demonstrates increased associated soft tissue thickening, consistent with local progression of disease, likely involving the nearby distal ileum, which serves as the transitional point of the  small bowel obstruction. 3. Mild right hydronephrosis and proximal hydroureter. 4. Subcutaneous fat stranding of the lower abdominal wall extending to the pelvis and scrotum, which may represent anasarca or cellulitis. 5. 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 6. Status post left nephrectomy. 7. Enlarged prostate gland with coarse calcifications which causes mass effect to the base of the urinary bladder. 8. Aortic atherosclerosis. Aortic Atherosclerosis (ICD10-I70.0). Electronically Signed   By: Fidela Salisbury M.D.   On: 06/28/2020 18:25   DG Abd Portable 1 View  Result Date: 06/28/2020 CLINICAL DATA:  Status post advancement of NG tube. EXAM: PORTABLE ABDOMEN - 1 VIEW COMPARISON:  June 28, 2020 FINDINGS: The bowel gas pattern is normal. Enteric catheter tip overlies the expected location of gastric body. Gastrostomy tube overlies expected location of the gastric antrum. IMPRESSION: Enteric catheter tip overlies the expected location of gastric body. Electronically Signed   By: Fidela Salisbury M.D.   On: 06/28/2020 20:51   DG Abd Portable 1 View  Result Date: 06/28/2020 CLINICAL DATA:  S/p advancement of NG tube EXAM: PORTABLE ABDOMEN - 1 VIEW COMPARISON:  Abdominal radiograph 06/28/2020 at 7:49 p.m. FINDINGS: Interval slight advancement of the nasogastric tube with tip projecting over the distal esophagus. Dilated loops of small bowel in the abdomen consistent with obstruction. Jobina Maita gastrostomy tube projects over the central upper abdomen. IMPRESSION: Slight advancement of the nasogastric tube with tip projecting over the distal esophagus. Recommend advancing into the stomach. Electronically Signed   By: Audie Pinto M.D.   On: 06/28/2020 20:07   DG Abd Portable 1 View  Result Date: 06/28/2020 CLINICAL DATA:  S/p NG tube placement EXAM: PORTABLE ABDOMEN - 1 VIEW COMPARISON:  Abdominal radiograph 04/24/2020 FINDINGS: The nasogastric tube side port  projects over the distal esophagus. There are dilated loops of small bowel consistent with small bowel obstruction seen on same day CT. IMPRESSION: The nasogastric tube side port projects over the distal esophagus. Recommend advancement into the stomach. Electronically Signed   By: Audie Pinto M.D.   On: 06/28/2020 20:00        Scheduled Meds: . enoxaparin (LOVENOX) injection  40 mg Subcutaneous Q24H  . fluconazole  100 mg Oral Daily  .  latanoprost  1 drop Both Eyes QHS  .  morphine injection  4 mg Intravenous Once  . tamsulosin  0.8 mg Oral QHS   Continuous Infusions: . ampicillin-sulbactam (UNASYN) IV    . dextrose 5 % and 0.9% NaCl 125 mL/hr at 06/29/20 0822     LOS: 1 day    Time spent: over 30 min    Fayrene Helper, MD [facility]   To contact the attending provider between 7A-7P or the covering provider during after hours 7P-7A, please log into the web site [email or web address] and access using universal [place] password for that web site. If you do not have the password, please call the hospital operator.  06/29/2020, 3:59 PM

## 2020-06-29 NOTE — Progress Notes (Signed)
Pharmacy Antibiotic Note  Shawn Chambers is a 79 y.o. male admitted on 06/28/2020 with fournier's gangrene.  Pharmacy has been consulted for Unasyn dosing.  Plan: Unasyn 3g IV q6  Height: 5\' 9"  (175.3 cm) Weight: 73.1 kg (161 lb 1.6 oz) IBW/kg (Calculated) : 70.7  Temp (24hrs), Avg:98.1 F (36.7 C), Min:97.7 F (36.5 C), Max:98.4 F (36.9 C)  Recent Labs  Lab 06/28/20 1313 06/29/20 0630  WBC 10.6* 7.0  CREATININE 1.87* 1.48*    Estimated Creatinine Clearance: 39.8 mL/min (A) (by C-G formula based on SCr of 1.48 mg/dL (H)).    No Known Allergies   Thank you for allowing pharmacy to be a part of this patient's care.  Kara Mead 06/29/2020 12:28 PM

## 2020-06-29 NOTE — Consult Note (Addendum)
General Surgery [facility]  Reason for Consult: malignant small bowel obstruction, malfunction of gastrostomy tube  Referring Physician: Dr. Fayrene Helper, [facility]  Shawn Chambers is an 79 y.o. male.  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  Patient has metastatic adenocarcinoma of the colon as well as metastatic renal cell carcinoma.  He has a distal small bowel obstruction secondary to this malignancy.  Patient was evaluated by the surgical service in May 2021 and it was determined that he was not a candidate for surgical intervention.  Therefore a decompressive gastrostomy tube was placed by interventional radiology on Apr 24, 2020.  Patient is now readmitted to the medical service with signs and symptoms of small bowel obstruction.  Patient had been venting the gastrostomy tube approximately three times a day with good symptomatic control.  However, it has apparently not been working well for the past few days.  Patient is a poor historian.  He does have home hospice nursing.  He was admitted to the hospital a little over 1 week ago with a perineal infection which required debridement by urology.  He is now on dressing changes with home health nursing.  General surgery is asked to evaluate for malfunctioning of his gastrostomy tube.  Past Medical History:  Diagnosis Date  . Arthritis    hand, knee- ? Dx.  Marland Kitchen BPH (benign prostatic hyperplasia)   . 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 Cataract    removed  . GERD (gastroesophageal reflux disease)   . Glaucoma   . Hearing loss   . Rectal adenocarcinoma ([place])   . Renal cancer ([place])   . Seizures ([place])    resolved- daughter states she didnt know he ever had seizures    Past Surgical History:  Procedure Laterality Date  . APPENDECTOMY    . COLONOSCOPY    . COLOSTOMY  REVISION N/A 10/18/2019   Procedure: OSTOMY REVISION;  Surgeon: Leighton Ruff, MD;  Location: [facility];  Service: General;  Laterality: N/A;  . CYSTOSCOPY WITH STENT PLACEMENT Bilateral 10/18/2019   Procedure: CYSTOSCOPY WITH FIREFLY INJECTION;  Surgeon: Cleon Gustin, MD;  Location: [facility];  Service: Urology;  Laterality: Bilateral;  . IR GASTROSTOMY TUBE MOD SED  04/24/2020  . PARTIAL COLECTOMY N/A 03/28/2017   Procedure: PARTIAL COLECTOMY/COLOSTOMY;  Surgeon: Georganna Skeans, MD;  Location: [facility];  Service: General;  Laterality: N/A;  Partial Colectomy, Mobilization of Splenic Flexure, Colostomy  . PARTIAL NEPHRECTOMY Left 03/28/2017   Procedure: NEPHRECTOMY TOTAL;  Surgeon: Raynelle Bring, MD;  Location: [place];  Service: Urology;  Laterality: Left;  . SCROTAL EXPLORATION Right 06/19/2020   Procedure: SCROTUM EXPLORATION,  RIGHT SIMPLE ORCHIECTOMY;  Surgeon: Remi Haggard, MD;  Location[facility];  Service: Urology;  Laterality: Right;  . XI ROBOTIC ASSISTED LOWER ANTERIOR RESECTION N/A 10/18/2019   Procedure: XI ROBOTIC ASSISTED LOWER ANTERIOR RESECTION WITH PERISTOMAL HERNIA REPAIR WITH MESH ( SUGAR BAKER);  Surgeon: Leighton Ruff, MD;  Location: [facility];  Service: General;  Laterality: N/A;    Family History  Problem Relation Age of Onset  . Esophageal cancer Paternal Uncle   . Colon polyps Daughter   . Colon cancer Neg Hx   . Rectal cancer Neg Hx   . Stomach cancer Neg Hx     Social History:  reports that he quit smoking about 3 years ago. His smoking  use included cigarettes. He has a 60.00 pack-year smoking history. He has never used smokeless tobacco. He reports that he does not drink alcohol and does not use drugs.  Allergies: No Known Allergies  Medications: I have reviewed the patient's current medications.  Results for orders placed or performed during the hospital encounter of 06/28/20 (from the past 48 hour(s))  CBC with Differential     Status: Abnormal   Collection Time:  06/28/20  1:13 PM  Result Value Ref Range   WBC 10.6 (H) 4.0 - 10.5 K/uL   RBC 3.43 (L) 4.22 - 5.81 MIL/uL   Hemoglobin 8.8 (L) 13.0 - 17.0 g/dL   HCT 28.7 (L) 39 - 52 %   MCV 83.7 80.0 - 100.0 fL   MCH 25.7 (L) 26.0 - 34.0 pg   MCHC 30.7 30.0 - 36.0 g/dL   RDW 18.0 (H) 11.5 - 15.5 %   Platelets 483 (H) 150 - 400 K/uL   nRBC 0.0 0.0 - 0.2 %   Neutrophils Relative % 80 %   Neutro Abs 8.6 (H) 1.7 - 7.7 K/uL   Lymphocytes Relative 9 %   Lymphs Abs 0.9 0.7 - 4.0 K/uL   Monocytes Relative 9 %   Monocytes Absolute 1.0 0 - 1 K/uL   Eosinophils Relative 1 %   Eosinophils Absolute 0.1 0 - 0 K/uL   Basophils Relative 0 %   Basophils Absolute 0.0 0 - 0 K/uL   Immature Granulocytes 1 %   Abs Immature Granulocytes 0.06 0.00 - 0.07 K/uL    Comment: Performed at [facility], [place] [address], [place], [place] [address]  Comprehensive metabolic panel     Status: Abnormal   Collection Time: 06/28/20  1:13 PM  Result Value Ref Range   Sodium 134 (L) 135 - 145 mmol/L   Potassium 5.6 (H) 3.5 - 5.1 mmol/L   Chloride 93 (L) 98 - 111 mmol/L   CO2 29 22 - 32 mmol/L   Glucose, Bld 95 70 - 99 mg/dL    Comment: Glucose reference range applies only to samples taken after fasting for at least 8 hours.   BUN 30 (H) 8 - 23 mg/dL   Creatinine, Ser 1.87 (H) 0.61 - 1.24 mg/dL   Calcium 9.6 8.9 - 10.3 mg/dL   Total Protein 7.1 6.5 - 8.1 g/dL   Albumin 3.0 (L) 3.5 - 5.0 g/dL   AST 19 15 - 41 U/L   ALT 21 0 - 44 U/L   Alkaline Phosphatase 96 38 - 126 U/L   Total Bilirubin 0.5 0.3 - 1.2 mg/dL   GFR calc non Af Amer 33 (L) >60 mL/min   GFR calc Af Amer 38 (L) >60 mL/min   Anion gap 12 5 - 15    Comment: Performed at [facility], [place] [address], [place], [place] [address]  Lipase, blood     Status: None   Collection Time: 06/28/20  1:13 PM  Result Value Ref Range   Lipase 36 11 - 51 U/L    Comment: Performed at [facility], [place] [address],  [place], [place] [address]  SARS Coronavirus 2 by RT PCR (hospital order, performed in [facility] hospital lab) Nasopharyngeal Nasopharyngeal Swab     Status: None   Collection Time: 06/28/20  7:47 PM   Specimen: Nasopharyngeal Swab  Result Value Ref Range   SARS Coronavirus 2 NEGATIVE NEGATIVE    Comment: (NOTE) SARS-CoV-2 target nucleic acids are  NOT DETECTED.  The SARS-CoV-2 RNA is generally detectable in upper and lower respiratory specimens during the acute phase of infection. The lowest concentration of SARS-CoV-2 viral copies this assay can detect is 250 copies / mL. A negative result does not preclude SARS-CoV-2 infection and should not be used as the sole basis for treatment or other patient management decisions.  A negative result may occur with improper specimen collection / handling, submission of specimen other than nasopharyngeal swab, presence of viral mutation(s) within the areas targeted by this assay, and inadequate number of viral copies (<250 copies / mL). A negative result must be combined with clinical observations, patient history, and epidemiological information.  Fact Sheet for Patients:   StrictlyIdeas.no  Fact Sheet for Healthcare Providers: BankingDealers.co.za  This test is not yet approved or  cleared by the [place] FDA and has been authorized for detection and/or diagnosis of SARS-CoV-2 by FDA under an Emergency Use Authorization (EUA).  This EUA will remain in effect (meaning this test can be used) for the duration of the COVID-19 declaration under Section 564(b)(1) of the Act, 21 U.S.C. section 360bbb-3(b)(1), unless the authorization is terminated or revoked sooner.  Performed at [facility], [place] [address], [place], [place] [address]   Comprehensive metabolic panel     Status: Abnormal   Collection Time: 06/29/20  6:30 AM  Result Value Ref Range   Sodium 135 135 - 145 mmol/L    Potassium 4.6 3.5 - 5.1 mmol/L    Comment: DELTA CHECK NOTED   Chloride 99 98 - 111 mmol/L   CO2 27 22 - 32 mmol/L   Glucose, Bld 104 (H) 70 - 99 mg/dL    Comment: Glucose reference range applies only to samples taken after fasting for at least 8 hours.   BUN 43 (H) 8 - 23 mg/dL   Creatinine, Ser 1.48 (H) 0.61 - 1.24 mg/dL   Calcium 8.5 (L) 8.9 - 10.3 mg/dL   Total Protein 5.6 (L) 6.5 - 8.1 g/dL   Albumin 2.3 (L) 3.5 - 5.0 g/dL   AST 16 15 - 41 U/L   ALT 17 0 - 44 U/L   Alkaline Phosphatase 68 38 - 126 U/L   Total Bilirubin 0.4 0.3 - 1.2 mg/dL   GFR calc non Af Amer 44 (L) >60 mL/min   GFR calc Af Amer 51 (L) >60 mL/min   Anion gap 9 5 - 15    Comment: Performed at [facility], [place] [address], [place], [place] [address]  CBC     Status: Abnormal   Collection Time: 06/29/20  6:30 AM  Result Value Ref Range   WBC 7.0 4.0 - 10.5 K/uL   RBC 2.61 (L) 4.22 - 5.81 MIL/uL   Hemoglobin 6.7 (LL) 13.0 - 17.0 g/dL    Comment: REPEATED TO VERIFY THIS CRITICAL RESULT HAS VERIFIED AND BEEN CALLED TO CHEEK,K.,RN BY TURNER,SHAWANA ON 07 18 2021 AT 0715, AND HAS BEEN READ BACK.     HCT 21.2 (L) 39 - 52 %   MCV 81.2 80.0 - 100.0 fL   MCH 25.7 (L) 26.0 - 34.0 pg   MCHC 31.6 30.0 - 36.0 g/dL   RDW 17.9 (H) 11.5 - 15.5 %   Platelets 353 150 - 400 K/uL   nRBC 0.0 0.0 - 0.2 %    Comment: Performed at [facility], [place] [address] [place], [place] [address]    CT Abdomen Pelvis Wo Contrast  Result  Date: 06/28/2020 CLINICAL DATA:  Abdominal distension. History of colon cancer. EXAM: CT ABDOMEN AND PELVIS WITHOUT CONTRAST TECHNIQUE: Multidetector CT imaging of the abdomen and pelvis was performed following the standard protocol without IV contrast. COMPARISON:  Apr 22, 2020 FINDINGS: Lower chest: Mild bibasilar atelectasis. Hepatobiliary: Scattered hepatic calcifications. Normal decompressed gallbladder. Pancreas: Unremarkable. No pancreatic ductal dilatation or  surrounding inflammatory changes. Spleen: Normal in size without focal abnormality. Adrenals/Urinary Tract: Status post left nephrectomy. Normal adrenal glands. Mild right hydronephrosis and proximal hydroureter. The urinary bladder has normal appearance. Although enlarged prostate compresses the base of the urinary bladder. Stomach/Bowel: Moderate fluid distension of the stomach. Gastrostomy tube in expected position. Diffuse fluid-filled distended small bowels throughout the abdomen to the level of the Hartman pouch in the central pelvis. The Hartmann's pouch in the lower central pelvis demonstrates increased associated soft tissue thickening. Left-sided colostomy has stable appearance. Vascular/Lymphatic: Aortic atherosclerosis. 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 Shotty retroperitoneal lymph nodes, sub pathologic by CT criteria. Reproductive: Enlarged prostate gland with coarse calcifications. Other: Subcutaneous fat stranding of the lower abdominal wall extending to the pelvis and scrotum. Musculoskeletal: No definite metastatic disease noted. IMPRESSION: 1. Diffuse fluid-filled distended (up to 5 cm) small bowels throughout the abdomen to the level of the Hartman's pouch in the lower central pelvis. This represents a severe small bowel obstruction. 2. The Hartmann's pouch in the lower central pelvis demonstrates increased associated soft tissue thickening, consistent with local progression of disease, likely involving the nearby distal ileum, which serves as the transitional point of the small bowel obstruction. 3. Mild right hydronephrosis and proximal hydroureter. 4. Subcutaneous fat stranding of the lower abdominal wall extending to the pelvis and scrotum, which may represent anasarca or cellulitis. 5. 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 6. Status post left nephrectomy. 7. Enlarged prostate gland with coarse calcifications  which causes mass effect to the base of the urinary bladder. 8. Aortic atherosclerosis. Aortic Atherosclerosis (ICD10-I70.0). Electronically Signed   By: Fidela Salisbury M.D.   On: 06/28/2020 18:25   DG Abd Portable 1 View  Result Date: 06/28/2020 CLINICAL DATA:  Status post advancement of NG tube. EXAM: PORTABLE ABDOMEN - 1 VIEW COMPARISON:  June 28, 2020 FINDINGS: The bowel gas pattern is normal. Enteric catheter tip overlies the expected location of gastric body. Gastrostomy tube overlies expected location of the gastric antrum. IMPRESSION: Enteric catheter tip overlies the expected location of gastric body. Electronically Signed   By: Fidela Salisbury M.D.   On: 06/28/2020 20:51   DG Abd Portable 1 View  Result Date: 06/28/2020 CLINICAL DATA:  S/p advancement of NG tube EXAM: PORTABLE ABDOMEN - 1 VIEW COMPARISON:  Abdominal radiograph 06/28/2020 at 7:49 p.m. FINDINGS: Interval slight advancement of the nasogastric tube with tip projecting over the distal esophagus. Dilated loops of small bowel in the abdomen consistent with obstruction. A gastrostomy tube projects over the central upper abdomen. IMPRESSION: Slight advancement of the nasogastric tube with tip projecting over the distal esophagus. Recommend advancing into the stomach. Electronically Signed   By: Audie Pinto M.D.   On: 06/28/2020 20:07   DG Abd Portable 1 View  Result Date: 06/28/2020 CLINICAL DATA:  S/p NG tube placement EXAM: PORTABLE ABDOMEN - 1 VIEW COMPARISON:  Abdominal radiograph 04/24/2020 FINDINGS: The nasogastric tube side port projects over the distal esophagus. There are dilated loops of small bowel consistent with small bowel obstruction seen on same day CT. IMPRESSION:  The nasogastric tube side port projects over the distal esophagus. Recommend advancement into the stomach. Electronically Signed   By: Audie Pinto M.D.   On: 06/28/2020 20:00    Review of Systems  Constitutional: Positive for appetite  change.  HENT: Negative.   Eyes: Negative.   Respiratory: Negative.   Cardiovascular: Negative.   Gastrointestinal: Positive for abdominal distention, abdominal pain, constipation and nausea.  Endocrine: Negative.   Genitourinary: Negative.   Musculoskeletal: Negative.   Skin: Negative.   Allergic/Immunologic: Negative.   Neurological: Negative.   Hematological: Negative.   Psychiatric/Behavioral: Negative.     Physical Exam  -----------------  Blood pressure 105/61, pulse 62, temperature 98.4 F (36.9 C), temperature source Oral, resp. rate 20, height 5\' 9"  (1.753 m), weight 73.1 kg, SpO2 95 %.  CONSTITUTIONAL: no acute distress; conversant; no obvious deformities EYES: conjunctiva moist; no lid lag; anicteric; pupils equal bilaterally NECK: trachea midline; no thyroid nodularity LUNGS: respiratory effort normal & unlabored; no wheeze; no rales; no tactile fremitus CV: rate and rhythm regular; no palpable thrills; no murmur; no edema bilat lower extremities GI: Abdomen is soft, moderately distended, without significant tenderness to palpation in all four quadrants.  Well-healed surgical incisions.  There is a percutaneous gastrostomy tube just to the left of midline in the epigastrium.  There is a small amount of thin bilious fluid within the tube and a small amount of thin bilious drainage on the dressing.  There is a nasogastric tube in place with a large volume of bilious fluid in the suction canister.  No hepatosplenomegaly; no obvious hernia MSK: normal range of motion of extremities; no clubbing; no cyanosis PSYCHIATRIC: Patient is mildly confused.  He wants to go home. LYMPHATIC: no palpable cervical lymphadenopathy; no evidence lymphedema in extremities  -----------------  Assessment/Plan:  Small bowel obstruction secondary to metastatic adenocarcinoma / renal cell carcinoma Malfunction of gastrostomy tube  Patient has previously been evaluated by the  surgical service, most recently in early May 2021 by Dr. Neysa Bonito.  The patient is not a candidate for operative intervention.  A percutaneous gastrostomy tube was placed for decompression of the upper GI tract for palliation.  The patient is on home hospice care.  I will request a contrast study of the gastrostomy tube by interventional radiology.  Perhaps the tube needs to be repositioned or replaced.  This should provide symptomatic relief of his upper intestinal obstruction.  Given the degree of distention of the small intestine, the gastrostomy tube may need to remain on continuous dependent drainage instead of on intermittent drainage.  The tube should also be irrigated a couple of times a day in order to maintain patency.  No further surgical intervention is planned.  Armandina Gemma, MD [facility] Office: [place] 06/29/2020, 10:20 AM

## 2020-06-30 ENCOUNTER — Inpatient Hospital Stay (HOSPITAL_COMMUNITY)

## 2020-06-30 HISTORY — PX: IR CM INJ ANY COLONIC TUBE W/FLUORO: IMG2336

## 2020-06-30 LAB — CBC WITH DIFFERENTIAL/PLATELET
Abs Immature Granulocytes: 0.03 10*3/uL (ref 0.00–0.07)
Basophils Absolute: 0.1 10*3/uL (ref 0.0–0.1)
Basophils Relative: 1 %
Eosinophils Absolute: 0.1 10*3/uL (ref 0.0–0.5)
Eosinophils Relative: 1 %
HCT: 24.7 % — ABNORMAL LOW (ref 39.0–52.0)
Hemoglobin: 7.8 g/dL — ABNORMAL LOW (ref 13.0–17.0)
Immature Granulocytes: 0 %
Lymphocytes Relative: 12 %
Lymphs Abs: 1 10*3/uL (ref 0.7–4.0)
MCH: 26.5 pg (ref 26.0–34.0)
MCHC: 31.6 g/dL (ref 30.0–36.0)
MCV: 84 fL (ref 80.0–100.0)
Monocytes Absolute: 0.7 10*3/uL (ref 0.1–1.0)
Monocytes Relative: 8 %
Neutro Abs: 6.6 10*3/uL (ref 1.7–7.7)
Neutrophils Relative %: 78 %
Platelets: 353 10*3/uL (ref 150–400)
RBC: 2.94 MIL/uL — ABNORMAL LOW (ref 4.22–5.81)
RDW: 17.3 % — ABNORMAL HIGH (ref 11.5–15.5)
WBC: 8.5 10*3/uL (ref 4.0–10.5)
nRBC: 0 % (ref 0.0–0.2)

## 2020-06-30 LAB — COMPREHENSIVE METABOLIC PANEL
ALT: 16 U/L (ref 0–44)
AST: 18 U/L (ref 15–41)
Albumin: 2.5 g/dL — ABNORMAL LOW (ref 3.5–5.0)
Alkaline Phosphatase: 63 U/L (ref 38–126)
Anion gap: 7 (ref 5–15)
BUN: 33 mg/dL — ABNORMAL HIGH (ref 8–23)
CO2: 27 mmol/L (ref 22–32)
Calcium: 8.6 mg/dL — ABNORMAL LOW (ref 8.9–10.3)
Chloride: 104 mmol/L (ref 98–111)
Creatinine, Ser: 1.37 mg/dL — ABNORMAL HIGH (ref 0.61–1.24)
GFR calc Af Amer: 56 mL/min — ABNORMAL LOW (ref >60)
GFR calc non Af Amer: 48 mL/min — ABNORMAL LOW (ref >60)
Glucose, Bld: 110 mg/dL — ABNORMAL HIGH (ref 70–99)
Potassium: 4.5 mmol/L (ref 3.5–5.1)
Sodium: 138 mmol/L (ref 135–145)
Total Bilirubin: 0.3 mg/dL (ref 0.3–1.2)
Total Protein: 5.7 g/dL — ABNORMAL LOW (ref 6.5–8.1)

## 2020-06-30 LAB — TYPE AND SCREEN
ABO/RH(D): A POS
Antibody Screen: NEGATIVE
Unit division: 0

## 2020-06-30 LAB — BPAM RBC
Blood Product Expiration Date: 202108072359
ISSUE DATE / TIME: 202107182330
Unit Type and Rh: 6200

## 2020-06-30 LAB — MAGNESIUM: Magnesium: 2.3 mg/dL (ref 1.7–2.4)

## 2020-06-30 LAB — PHOSPHORUS: Phosphorus: 3.1 mg/dL (ref 2.5–4.6)

## 2020-06-30 MED ORDER — IOHEXOL 300 MG/ML  SOLN
50.0000 mL | Freq: Once | INTRAMUSCULAR | Status: AC | PRN
  Administered 2020-06-30: 12 mL

## 2020-06-30 NOTE — Progress Notes (Addendum)
HEMATOLOGY-ONCOLOGY PROGRESS NOTE  SUBJECTIVE: Shawn Chambers presented to the emergency room with abdominal pain, nausea, vomiting.  G-tube at home had been draining only a very small amount.  He had abdominal distention and persistent nausea and vomiting.  He was found to have a small bowel obstruction.  Has been seen by general surgery and there are no plans for surgical intervention.  IR has also seen the patient and is planning for evaluation of his percutaneous gastrostomy tube for venting today.  When seen this morning, he reports that his abdominal distention and pain have improved. He is no longer having nausea or vomiting.  He states that he wants to go home today following evaluation of his gastrostomy tube.  Oncology History  Metastatic Rectal cancer [id number]   10/18/2019 Initial Diagnosis   Rectal cancer s/p robotic LAR rectosigmoid resction 10/19/2019   04/10/2020 -  Chemotherapy   The patient had palonosetron (ALOXI) injection 0.25 mg, 0.25 mg, Intravenous,  Once, 0 of 4 cycles oxaliplatin (ELOXATIN) 170 mg in dextrose 5 % 500 mL chemo infusion, 85 mg/m2 = 170 mg (original dose ), Intravenous,  Once, 0 of 4 cycles Dose modification: 85 mg/m2 (Cycle 1, Reason: Provider Judgment)  for chemotherapy treatment.      PHYSICAL EXAMINATION:  Vitals:   06/30/20 0238 06/30/20 0527  BP: 128/68 121/76  Pulse: 76 69  Resp: 16 15  Temp: 97.9 F (36.6 C) 98.1 F (36.7 C)  SpO2: 98% 96%   Filed Weights   06/28/20 1129  Weight: 73.1 kg    Intake/Output from previous day: 07/18 0701 - 07/19 0700 In: 2598 [I.V.:1922; Blood:326; IV Piggyback:350] Out: 5885 [Urine:450; Emesis/NG output:975]  GENERAL:alert, no distress and comfortable OROPHARYNX: No thrush or mucositis ABDOMEN: Soft with mild distention, no pain with palpation NEURO: alert & oriented x 3 with fluent speech, no focal motor/sensory deficits  LABORATORY DATA:  I have reviewed the data as listed CMP Latest Ref Rng &  Units 06/30/2020 06/29/2020 06/28/2020  Glucose 70 - 99 mg/dL 110(H) 104(H) 95  BUN 8 - 23 mg/dL 33(H) 43(H) 30(H)  Creatinine 0.61 - 1.24 mg/dL 1.37(H) 1.48(H) 1.87(H)  Sodium 135 - 145 mmol/L 138 135 134(L)  Potassium 3.5 - 5.1 mmol/L 4.5 4.6 5.6(H)  Chloride 98 - 111 mmol/L 104 99 93(L)  CO2 22 - 32 mmol/L '27 27 29  ' Calcium 8.9 - 10.3 mg/dL 8.6(L) 8.5(L) 9.6  Total Protein 6.5 - 8.1 g/dL 5.7(L) 5.6(L) 7.1  Total Bilirubin 0.3 - 1.2 mg/dL 0.3 0.4 0.5  Alkaline Phos 38 - 126 U/L 63 68 96  AST 15 - 41 U/L '18 16 19  ' ALT 0 - 44 U/L '16 17 21    ' Lab Results  Component Value Date   WBC 8.5 06/30/2020   HGB 7.8 (L) 06/30/2020   HCT 24.7 (L) 06/30/2020   MCV 84.0 06/30/2020   PLT 353 06/30/2020   NEUTROABS 6.6 06/30/2020    CT Abdomen Pelvis Wo Contrast  Result Date: 06/28/2020 CLINICAL DATA:  Abdominal distension. History of colon cancer. EXAM: CT ABDOMEN AND PELVIS WITHOUT CONTRAST TECHNIQUE: Multidetector CT imaging of the abdomen and pelvis was performed following the standard protocol without IV contrast. COMPARISON:  Apr 22, 2020 FINDINGS: Lower chest: Mild bibasilar atelectasis. Hepatobiliary: Scattered hepatic calcifications. Normal decompressed gallbladder. Pancreas: Unremarkable. No pancreatic ductal dilatation or surrounding inflammatory changes. Spleen: Normal in size without focal abnormality. Adrenals/Urinary Tract: Status post left nephrectomy. Normal adrenal glands. Mild right hydronephrosis and proximal hydroureter. The urinary  bladder has normal appearance. Although enlarged prostate compresses the base of the urinary bladder. Stomach/Bowel: Moderate fluid distension of the stomach. Gastrostomy tube in expected position. Diffuse fluid-filled distended small bowels throughout the abdomen to the level of the Hartman pouch in the central pelvis. The Hartmann's pouch in the lower central pelvis demonstrates increased associated soft tissue thickening. Left-sided colostomy has stable  appearance. Vascular/Lymphatic: Aortic atherosclerosis. 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 Shotty retroperitoneal lymph nodes, sub pathologic by CT criteria. Reproductive: Enlarged prostate gland with coarse calcifications. Other: Subcutaneous fat stranding of the lower abdominal wall extending to the pelvis and scrotum. Musculoskeletal: No definite metastatic disease noted. IMPRESSION: 1. Diffuse fluid-filled distended (up to 5 cm) small bowels throughout the abdomen to the level of the Hartman's pouch in the lower central pelvis. This represents a severe small bowel obstruction. 2. The Hartmann's pouch in the lower central pelvis demonstrates increased associated soft tissue thickening, consistent with local progression of disease, likely involving the nearby distal ileum, which serves as the transitional point of the small bowel obstruction. 3. Mild right hydronephrosis and proximal hydroureter. 4. Subcutaneous fat stranding of the lower abdominal wall extending to the pelvis and scrotum, which may represent anasarca or cellulitis. 5. 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 6. Status post left nephrectomy. 7. Enlarged prostate gland with coarse calcifications which causes mass effect to the base of the urinary bladder. 8. Aortic atherosclerosis. Aortic Atherosclerosis (ICD10-I70.0). Electronically Signed   By: Fidela Salisbury M.D.   On: 06/28/2020 18:25   DG Abd 1 View  Result Date: 06/29/2020 CLINICAL DATA:  NG tube placement EXAM: ABDOMEN - 1 VIEW COMPARISON:  June 28, 2020 FINDINGS: The NG tube is coiled over the gastric body/fundus. A gastrostomy tube projects over the stomach. There are multiple dilated loops of small bowel throughout the abdomen. There is no definite pneumatosis or free air. IMPRESSION: NG tube is coiled over the gastric body/fundus. Dilated loops of small bowel concerning for small bowel obstruction or  ileus. Electronically Signed   By: Constance Holster M.D.   On: 06/29/2020 19:06   DG Abd Portable 1 View  Result Date: 06/28/2020 CLINICAL DATA:  Status post advancement of NG tube. EXAM: PORTABLE ABDOMEN - 1 VIEW COMPARISON:  June 28, 2020 FINDINGS: The bowel gas pattern is normal. Enteric catheter tip overlies the expected location of gastric body. Gastrostomy tube overlies expected location of the gastric antrum. IMPRESSION: Enteric catheter tip overlies the expected location of gastric body. Electronically Signed   By: Fidela Salisbury M.D.   On: 06/28/2020 20:51   DG Abd Portable 1 View  Result Date: 06/28/2020 CLINICAL DATA:  S/p advancement of NG tube EXAM: PORTABLE ABDOMEN - 1 VIEW COMPARISON:  Abdominal radiograph 06/28/2020 at 7:49 p.m. FINDINGS: Interval slight advancement of the nasogastric tube with tip projecting over the distal esophagus. Dilated loops of small bowel in the abdomen consistent with obstruction. A gastrostomy tube projects over the central upper abdomen. IMPRESSION: Slight advancement of the nasogastric tube with tip projecting over the distal esophagus. Recommend advancing into the stomach. Electronically Signed   By: Audie Pinto M.D.   On: 06/28/2020 20:07   DG Abd Portable 1 View  Result Date: 06/28/2020 CLINICAL DATA:  S/p NG tube placement EXAM: PORTABLE ABDOMEN - 1 VIEW COMPARISON:  Abdominal radiograph 04/24/2020 FINDINGS: The nasogastric tube side port projects over the distal esophagus. There are dilated loops of small bowel consistent with  small bowel obstruction seen on same day CT. IMPRESSION: The nasogastric tube side port projects over the distal esophagus. Recommend advancement into the stomach. Electronically Signed   By: Audie Pinto M.D.   On: 06/28/2020 20:00    ASSESSMENT AND PLAN: 1. AssesColon cancer, descending colon, stage II (T3 N0), status post a partial left colectomy 03/28/2017 ? MSI-stable, no loss of mismatch repair protein  expression ? CT abdomen/pelvis 03/26/2017 and CT chest 03/27/2017-no evidence metastatic disease, abdomen/pelvis CT-long segment of bowel wall thickening in the left colon, partial mechanical obstruction by the area of thickening in the left colon, solid enhancing left renal lesion ? Elevated preoperative CEA ? CT 11/15/2018-irregular rectal wall thickening/perirectal stranding, polypoid defect in the mid transverse colon proximal to the colostomy-indeterminate, nodularity of the prostate with a hyperdense component in the median lobe-stable ? MRI pelvis 02/01/2019-T3BN1 rectal tumor at 4.5 cm from the internal anal sphincter, 11.5 cm in length ? Colonoscopy 02/22/2019-malignant completely obstructing tumor in the proximal rectum. Polyps in the mid and distal rectum; polyps in the ascending, transverse/distal transverse colon. 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 Rectum biopsy shows high-grade dysplasia with focal features suspicious for adenocarcinoma. ? Radiation/Xeloda 03/06/2019-04/12/2019 ? MRI pelvis 08/17/2019-4.8 cm apple core lesion at the rectosigmoid junction, T3B N0; distance from tumor to the internal anal sphincter is 11.5 cm.   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 BPH  4. History of tobacco use  5.History of an irregular mole at the right upper back  6. Clinical stage III rectal cancer ? Colonoscopy 02/22/2019-malignant completely obstructing tumor in the proximal rectum. Polyps in the mid and distal rectum; polyps in the ascending, transverse/distal transverse colon. 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 Rectum biopsy shows high-grade dysplasia with focal features suspicious for adenocarcinoma. ? Radiation/Xeloda 03/06/2019-04/12/2019 ? Flexible sigmoidoscopy 06/11/2019-infiltrative and ulcerated completely obstructing large mass found in the rectosigmoid colon/very proximal rectum. Mass  circumferential. No bleeding present. Lesion now less fungating but still large in size. 25 mm polyp found in the proximal rectum. 12 mm polyp found in the distal rectum. ? Radiation/Xeloda 03/06/2019-04/12/2019 ? MRI pelvis 08/17/2019-4.8 cm apple core lesion at the rectosigmoid junction, T3B N0; distance from tumor to the internal anal sphincter is 11.5 cm. ? Low anterior resection and repair of parastomal hernia 10/18/2019,pT4pN1a,tumor involves the visceral peritoneum, adenocarcinoma present at the serosa of the distal resection margin, lymphovascular invasion present, 1/22 lymph nodes positive, multiple tumor deposits;MSI stable ? Cycle 1 adjuvant Xeloda 12/24/2019, took wrong dose and missed several days, discontinued 01/15/2020 ? Cycle 2 adjuvant Xeloda 01/21/2020 ? Cycle 3 adjuvant Xeloda2/27/2021 ? Cycle 4 adjuvant Xeloda 03/01/2020 ? CTs 03/26/2020-wall thickening in the remaining rectum with a soft tissue mass at the superior rectum, enlarged retroperitoneal lymph nodes, bladder wall thickening, no evidence of metastatic disease in the chest  7. 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 Tumor appears to involve superficial submucosa. Tumor present at cauterized resection margin (polyp base). No evidence of lymphovascular invasion. Other polyp showing tubulovillous adenoma with focal high-grade dysplasia. 8.  Admission 04/22/2020 with symptoms and an abdominal x-ray consistent with a bowel obstruction  CT abdomen/pelvis 04/22/2020-progression of local tumor recurrence in the presacral region, increased right hydronephrosis, small bowel dilatation extending from the proximal jejunum to mid-distal ileum, enlarged prostate with diffuse bladder wall thickening, stable aortocaval nodes  9.  Anemia secondary chronic disease, renal insufficiency, and possibly bleeding from tumor involving  the GI/GU tract   10.  06/28/2020-hospital admission for bowel obstruction 11.  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 Eudy is now admitted secondary to bowel obstruction.  His abdominal pain, distention, nausea, and vomiting have resolved.  He is not a surgical candidate.  He will be seen by IR today for evaluation of his venting gastrostomy tube.  He is followed at home by hospice.  He is anxious to discharge once his G-tube has been evaluated.  Recommendations: 1.  Evaluation of gastrostomy tube by IR 2.  Recommend continued hospice involvement in his care.  The patient is agreeable to this. 3.  The patient may discharged to home with hospice once his G-tube has been evaluated and his abdominal symptoms have resolved.   LOS: 2 days   Mikey Bussing, DNP, AGPCNP-BC, AOCNP 06/30/20 Mr. Rabbani is well-known to me with a history of metastatic colorectal cancer.  He is symptomatic with a bowel obstruction secondary to tumor involving small bowel.  He is now admitted with recurrent obstructive symptoms with a gastrostomy tube in place.  He can be discharged to home to continue hospice care when the gastrostomy tube is functioning.  I recommend a no CODE BLUE status.  I will be glad to discuss this with Mr. Follett and his family.  The home hospice team can continue this discussion with Mr. Mabe.  He is not scheduled for follow-up at the Cancer center.  I am available to see him as needed.

## 2020-06-30 NOTE — Progress Notes (Signed)
Patient was transfused with 1 unit of RBC. No reactions noted. Vital signs pre and post transfusion WNL.

## 2020-06-30 NOTE — Progress Notes (Signed)
IR plans to address g-tube today.  Patient on home hospice.  No surgical plans.  We will sign off.  Shawn Chambers 7:42 AM 06/30/2020

## 2020-06-30 NOTE — Discharge Summary (Signed)
Physician Discharge Summary  Shawn Chambers WNU:[id number] DOB: 21-Jul-1941 DOA: 06/28/2020  PCP: [name]  Admit date: 06/28/2020 Discharge date: 06/30/2020  Time spent: 40 minutes  Recommendations for Outpatient Follow-up:  1. Follow with hospice outpatient 2. Follow with urology outpatient for wound  3. Follow with oncology as needed 4. Consider G tube to continuous drainage if needed in the future  5. irrigate tube to maintain patency 2-3x/day 6. 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   Metastatic Rectal cancer [id number]    History of seizures   CKD (chronic kidney disease) stage 3, GFR 30-59 ml/min   HOH (hard of hearing)   Primary colon cancer with metastasis to other site [facility])   Discharge Condition: stable  Filed Weights   06/28/20 1129  Weight: 73.1 kg    History of present illness:  Shawn Hedgecockis Shawn Chambers 79 y.o.malewith medical history significant ofcolorectal cancer status post surgery, with colostomy in place, recent placement of venting gastrostomy tube, 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 Since he had his gastrostomy tube placed he has been drainage at least 3 times Kasheem Toner day. Lately however only Deanza Upperman tablespoon or very little was draining. Patient has had intermittent eating but has had significant loss of appetite. He was seen in the ER and evaluated. Found to have significant small bowel obstruction and is being admitted for treatment. Patient is hard of hearing but awake alert and able to communicate. He denied any  fever or chills denied any hematemesis no melena. Denied any blood in his ostomy bag. He has been admitted for treatment of small bowel obstruction..  ED Course:Vitals appear stable overall. White count 10.6 hemoglobin 8.8 and platelets 483. Sodium 134 potassium 5.6 chloride 93 CO2 29 BUN 30 creatinine 1.87 and glucose 95. Abdominal x-ray indicated distention around the gastric area consistent with small bowel obstruction. NG tube inserted. IV fluids started and patient being admitted for treatment  He was admitted for SBO.  NG was placed.  IR was consulted and confirmed venting gastrostomy tube was in expected location and patent.  NG was clamped on 7/19 and he tolerated this well.  NG removed and pt tolerated PO and was able to vent G tube.  He was discharged on 7/19 back to hospice care.  See below for additiona details  Hospital Course:  #1 SBO  Hx Colorectal Cancer s/p Colectomy and Colostomy 2018  S/p venting gastrostomy tube 04/2020: Decreased output from gtube CT with diffuse fluid filled distended small bowels to level of hartman's pouch c/w severe SBO Surgery c/s, appreciate recs - recommending contrast study of g tube by IR - notes gtube may need to remain on continuous dependent drainage instead of intermittent drainage 7/19 gastrostomy tube in expected location and widely patent by IR Tolerated clamping and removal of g tube.  Tolerated meal prior to discharge on 7/19.  # Fournier's Gangrene: s/p right orchiectomy, scotum exploration and debridement on 7/8 Augmentin x 2 weeks per urology Needs daily wet to dry dressing changes per previous note  CT from admission with subcutaneous fat stranding of lower abdominal wall extending to pelvis/scrotum (  anasarca vs cellulitis) - no obvious cellulitis to lower abdomen on exam today, wound is well appearing   # AKI on CKD IIIa: baseline creatinine appears to be ~ 1.2-3.  Recently creatinine as high as 2 during last admission.   Downtrending today on day of discharge.  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 Continue follow-up with oncology.  #5 hard of hearing:Patient he has very well in the left ear compared to the right ear.  #6 history of seizures:Continue treatment  # Goals of Care: he's followed by hospice.  Code status at this admit is full and previous admits was full.  Note from 5/14 from oncology notes they were agreeable to DNR, but has been admitted as full code multiple times since then.  Discussed with pt today and noncommittal.  Will discuss with pt and family.  Discussed with pt and daughter, they note desire for full code. Continue to discuss outpatient   Procedures: 7/19 GI tube injection IMPRESSION: The gastrostomy tube is in expected location and widely patent, okay for routine use.  Consultations:  Oncology  Surgery  IR  Discharge Exam: Vitals:   06/30/20 0527 06/30/20 1421  BP: 121/76 124/78  Pulse: 69 72  Resp: 15 18  Temp: 98.1 F (36.7 C) 98.2 F (36.8 C)  SpO2: 96% 95%   Eager to go home, no complaitns Son at bedside, discussed poc over phone  General: No acute distress. Cardiovascular: Heart sounds show Cerinity Zynda regular rate, and rhythm.  Lungs: Clear to auscultation bilaterally  Abdomen: Soft, nontender, nondistended - ostomy, g tube Neurological: Alert and oriented 3. Moves all extremities 4 . Cranial nerves II through XII grossly intact. Skin: Warm and dry. No rashes or lesions. Extremities: No clubbing or cyanosis. No edema.  Discharge Instructions   Discharge Instructions    Call MD for:  difficulty breathing, headache or visual disturbances   Complete by: As directed    Call MD for:  extreme fatigue   Complete by: As directed    Call MD for:  hives   Complete by: As directed    Call  MD for:  persistant dizziness or light-headedness   Complete by: As directed    Call MD for:  persistant nausea and vomiting   Complete by: As directed    Call MD for:  redness, tenderness, or signs of infection (pain, swelling, redness, odor or green/yellow discharge around incision site)   Complete by: As directed    Call MD for:  severe uncontrolled pain   Complete by: As directed    Call MD for:  temperature >100.4   Complete by: As directed    Diet - low sodium heart healthy   Complete by: As directed    Discharge instructions   Complete by: As directed    You were seen for Shae Hinnenkamp small bowel obstruction.  You had an NG place and radiology examined your G tube, which was in the appropriate position.  You've done well with the nasogastric tube out, so we'll discharge you back home to follow up with hospice.  Continue your antibiotics as prescribed and follow up with urology as planned.  Return for new, recurrent, or worsening symptoms.  Please ask your PCP to request records from this hospitalization so they know what was done and what the next steps will be.  Discharge wound care:   Complete by: As directed    Continue wet to dry dressing changes daily   Increase activity slowly   Complete by: As directed      Allergies as of 06/30/2020   No Known Allergies     Medication List    STOP taking these medications   docusate sodium 100 MG capsule Commonly known as: COLACE   loperamide 2 MG capsule Commonly known as: IMODIUM     TAKE these medications   amoxicillin-clavulanate 875-125 MG tablet Commonly known as: Augmentin Take 1 tablet by mouth 2 (two) times daily for 10 days. What changed: additional instructions   bisacodyl 5 MG EC tablet Commonly known as: DULCOLAX Take 5 mg by mouth daily as needed for moderate constipation.   cholecalciferol 25 MCG (1000 UNIT) tablet Commonly known as: VITAMIN D3 Take 1,000 Units by mouth daily.   [employer] Purelax 17 g  packet Generic drug: polyethylene glycol Take 1 packet by mouth daily as needed for moderate constipation.   Diflucan 100 MG tablet Generic drug: fluconazole Take 100 mg by mouth daily.   HYDROcodone-acetaminophen 5-325 MG tablet Commonly known as: NORCO/VICODIN Take 0.5-1 tablets by mouth every 4 (four) hours as needed for moderate pain or severe pain.   ibuprofen 200 MG tablet Commonly known as: ADVIL Take 200 mg by mouth every 6 (six) hours as needed for moderate pain.   latanoprost 0.005 % ophthalmic solution Commonly known as: XALATAN Place 1 drop into both eyes at bedtime.   magnesium citrate Soln Take 0.5 Bottles by mouth as needed for severe constipation.   ondansetron 4 MG tablet Commonly known as: ZOFRAN Take 4 mg by mouth every 8 (eight) hours as needed for nausea or vomiting.   tamsulosin 0.4 MG Caps capsule Commonly known as: FLOMAX Take 0.8 mg by mouth at bedtime.            Discharge Care Instructions  (From admission, onward)         Start     Ordered   06/30/20 0000  Discharge wound care:       Comments: Continue wet to dry dressing changes daily   06/30/20 1726         No Known Allergies    The results of significant diagnostics from this hospitalization (including imaging, microbiology, ancillary and laboratory) are listed below for reference.    Significant Diagnostic Studies: CT Abdomen Pelvis Wo Contrast  Result Date: 06/28/2020 CLINICAL DATA:  Abdominal distension. History of colon cancer. EXAM: CT ABDOMEN AND PELVIS WITHOUT CONTRAST TECHNIQUE: Multidetector CT imaging of the abdomen and pelvis was performed following the standard protocol without IV contrast. COMPARISON:  Apr 22, 2020 FINDINGS: Lower chest: Mild bibasilar atelectasis. Hepatobiliary: Scattered hepatic calcifications. Normal decompressed gallbladder. Pancreas: Unremarkable. No pancreatic ductal dilatation or surrounding inflammatory changes. Spleen: Normal in size  without focal abnormality. Adrenals/Urinary Tract: Status post left nephrectomy. Normal adrenal glands. Mild right hydronephrosis and proximal hydroureter. The urinary bladder has normal appearance. Although enlarged prostate compresses the base of the urinary bladder. Stomach/Bowel: Moderate fluid distension of the stomach. Gastrostomy tube in expected position. Diffuse fluid-filled distended small bowels throughout the abdomen to the level of the Hartman pouch in the central pelvis. The Hartmann's pouch in the lower central pelvis demonstrates increased associated soft tissue thickening. Left-sided colostomy has stable appearance. Vascular/Lymphatic: Aortic atherosclerosis. 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 Shotty retroperitoneal lymph nodes, sub pathologic by CT criteria. Reproductive: Enlarged  prostate gland with coarse calcifications. Other: Subcutaneous fat stranding of the lower abdominal wall extending to the pelvis and scrotum. Musculoskeletal: No definite metastatic disease noted. IMPRESSION: 1. Diffuse fluid-filled distended (up to 5 cm) small bowels throughout the abdomen to the level of the Hartman's pouch in the lower central pelvis. This represents Jesenya Bowditch severe small bowel obstruction. 2. The Hartmann's pouch in the lower central pelvis demonstrates increased associated soft tissue thickening, consistent with local progression of disease, likely involving the nearby distal ileum, which serves as the transitional point of the small bowel obstruction. 3. Mild right hydronephrosis and proximal hydroureter. 4. Subcutaneous fat stranding of the lower abdominal wall extending to the pelvis and scrotum, which may represent anasarca or cellulitis. 5. 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 6. Status post left nephrectomy. 7. Enlarged prostate gland with coarse calcifications which causes mass effect to the base of the urinary  bladder. 8. Aortic atherosclerosis. Aortic Atherosclerosis (ICD10-I70.0). Electronically Signed   By: Fidela Salisbury M.D.   On: 06/28/2020 18:25   DG Abd 1 View  Result Date: 06/29/2020 CLINICAL DATA:  NG tube placement EXAM: ABDOMEN - 1 VIEW COMPARISON:  June 28, 2020 FINDINGS: The NG tube is coiled over the gastric body/fundus. Arayla Kruschke gastrostomy tube projects over the stomach. There are multiple dilated loops of small bowel throughout the abdomen. There is no definite pneumatosis or free air. IMPRESSION: NG tube is coiled over the gastric body/fundus. Dilated loops of small bowel concerning for small bowel obstruction or ileus. Electronically Signed   By: Constance Holster M.D.   On: 06/29/2020 19:06   IR Cm Inj Any Colonic Tube W/Fluoro  Result Date: 06/30/2020 INDICATION: Colon carcinoma, bowel obstruction, gastrostomy tube placed 04/24/2020 for palliative decompression. Decreased output. EXAM: GI TUBE INJECTION MEDICATIONS: None indicated ANESTHESIA/SEDATION: None required CONTRAST:  10mL OMNIPAQUE IOHEXOL 300 MG/ML SOLN - administered into the gastric lumen. FLUOROSCOPY TIME:  12 seconds; 3 mGy COMPLICATIONS: None immediate. PROCEDURE: Inspection of the gastrostomy catheter demonstrates well position in the tract with external bumper in place. Catheter flushes and aspirates easily. Under fluoroscopy, catheter injection shows flow into the lumen of the stomach. Nasogastric tube loops in the decompressed stomach. IMPRESSION: The gastrostomy tube is in expected location and widely patent, okay for routine use. Electronically Signed   By: Lucrezia Europe M.D.   On: 06/30/2020 13:15   DG Abd Portable 1 View  Result Date: 06/28/2020 CLINICAL DATA:  Status post advancement of NG tube. EXAM: PORTABLE ABDOMEN - 1 VIEW COMPARISON:  June 28, 2020 FINDINGS: The bowel gas pattern is normal. Enteric catheter tip overlies the expected location of gastric body. Gastrostomy tube overlies expected location of the  gastric antrum. IMPRESSION: Enteric catheter tip overlies the expected location of gastric body. Electronically Signed   By: Fidela Salisbury M.D.   On: 06/28/2020 20:51   DG Abd Portable 1 View  Result Date: 06/28/2020 CLINICAL DATA:  S/p advancement of NG tube EXAM: PORTABLE ABDOMEN - 1 VIEW COMPARISON:  Abdominal radiograph 06/28/2020 at 7:49 p.m. FINDINGS: Interval slight advancement of the nasogastric tube with tip projecting over the distal esophagus. Dilated loops of small bowel in the abdomen consistent with obstruction. Wayland Baik gastrostomy tube projects over the central upper abdomen. IMPRESSION: Slight advancement of the nasogastric tube with tip projecting over the distal esophagus. Recommend advancing into the stomach. Electronically Signed   By: Audie Pinto M.D.   On: 06/28/2020 20:07   DG Abd Portable 1 View  Result Date: 06/28/2020 CLINICAL DATA:  S/p NG tube placement EXAM: PORTABLE ABDOMEN - 1 VIEW COMPARISON:  Abdominal radiograph 04/24/2020 FINDINGS: The nasogastric tube side port projects over the distal esophagus. There are dilated loops of small bowel consistent with small bowel obstruction seen on same day CT. IMPRESSION: The nasogastric tube side port projects over the distal esophagus. Recommend advancement into the stomach. Electronically Signed   By: Audie Pinto M.D.   On: 06/28/2020 20:00    Microbiology: Recent Results (from the past 240 hour(s))  SARS Coronavirus 2 by RT PCR (hospital order, performed in [facility] hospital lab) Nasopharyngeal Nasopharyngeal Swab     Status: None   Collection Time: 06/28/20  7:47 PM   Specimen: Nasopharyngeal Swab  Result Value Ref Range Status   SARS Coronavirus 2 NEGATIVE NEGATIVE Final    Comment: (NOTE) SARS-CoV-2 target nucleic acids are NOT DETECTED.  The SARS-CoV-2 RNA is generally detectable in upper and lower respiratory specimens during the acute phase of infection. The lowest concentration of SARS-CoV-2 viral  copies this assay can detect is 250 copies / mL. Ramadan Couey negative result does not preclude SARS-CoV-2 infection and should not be used as the sole basis for treatment or other patient management decisions.  Traivon Morrical negative result may occur with improper specimen collection / handling, submission of specimen other than nasopharyngeal swab, presence of viral mutation(s) within the areas targeted by this assay, and inadequate number of viral copies (<250 copies / mL). Jarious Lyon negative result must be combined with clinical observations, patient history, and epidemiological information.  Fact Sheet for Patients:   StrictlyIdeas.no  Fact Sheet for Healthcare Providers: BankingDealers.co.za  This test is not yet approved or  cleared by the [place] FDA and has been authorized for detection and/or diagnosis of SARS-CoV-2 by FDA under an Emergency Use Authorization (EUA).  This EUA will remain in effect (meaning this test can be used) for the duration of the COVID-19 declaration under Section 564(b)(1) of the Act, 21 U.S.C. section 360bbb-3(b)(1), unless the authorization is terminated or revoked sooner.  Performed at [facility], [place] [address] [place], [place] [address]      Labs: Basic Metabolic Panel: Recent Labs  Lab 06/28/20 1313 06/29/20 0630 06/30/20 0608  NA 134* 135 138  K 5.6* 4.6 4.5  CL 93* 99 104  CO2 29 27 27   GLUCOSE 95 104* 110*  BUN 30* 43* 33*  CREATININE 1.87* 1.48* 1.37*  CALCIUM 9.6 8.5* 8.6*  MG  --   --  2.3  PHOS  --   --  3.1   Liver Function Tests: Recent Labs  Lab 06/28/20 1313 06/29/20 0630 06/30/20 0608  AST 19 16 18   ALT 21 17 16   ALKPHOS 96 68 63  BILITOT 0.5 0.4 0.3  PROT 7.1 5.6* 5.7*  ALBUMIN 3.0* 2.3* 2.5*   Recent Labs  Lab 06/28/20 1313  LIPASE 36   No results for input(s): AMMONIA in the last 168 hours. CBC: Recent Labs  Lab 06/28/20 1313 06/29/20 0630  06/30/20 0608  WBC 10.6* 7.0 8.5  NEUTROABS 8.6*  --  6.6  HGB 8.8* 6.7* 7.8*  HCT 28.7* 21.2* 24.7*  MCV 83.7 81.2 84.0  PLT 483* 353 353   Cardiac Enzymes: No results for input(s): CKTOTAL, CKMB, CKMBINDEX, TROPONINI in the last 168 hours. BNP: BNP (last 3 results) No results for input(s): BNP in the last 8760 hours.  ProBNP (last 3 results) No results for input(s): PROBNP in the  last 8760 hours.  CBG: No results for input(s): GLUCAP in the last 168 hours.     Signed:  Fayrene Helper MD.  [facility] 06/30/2020, 5:26 PM

## 2020-06-30 NOTE — Plan of Care (Signed)
Pt was discharged home today. Instructions were reviewed with patient, and questions were answered. Pt was taken to main entrance via wheelchair by NT.  

## 2020-06-30 NOTE — Progress Notes (Signed)
[facility]: Shawn Chambers  Pt is a current home care pt enrolled with services with our organization. Thank you for assisting [place] in providing comfort care for this pt and please reach out to me with concerns.  We have spoke to the pt's wife and they are wanting to proceed with return home at d/c with hospice services.   Webb Silversmith RN [phone]

## 2020-07-03 ENCOUNTER — Telehealth: Payer: Self-pay | Admitting: *Deleted

## 2020-07-03 NOTE — Telephone Encounter (Signed)
Recently returned home from hospital due to SBO. Patient concerned that he has not had BM since getting home. Patient is asking if he gets back on the "chemo pills" will that make the mass shrink enough to have bowel movements? He wanted RN to call Dr. Gearldine Shown office. Per Dr. Benay Spice: There is nothing that can be due in regards to chemo to resolve this issue. RN will tell him tomorrow. Hospice MD going to home w/her tomorrow to talk more and they are bringing suction to home so he can attach his g-tube to suction.

## 2020-07-28 ENCOUNTER — Telehealth: Payer: Self-pay | Admitting: *Deleted

## 2020-07-28 ENCOUNTER — Telehealth: Payer: Self-pay

## 2020-07-28 NOTE — Telephone Encounter (Signed)
Connected with [name] [phone]) to inquire about FMLA extension requested in voicemail.  No form currently received.  "Just learned yesterday current FMLA expires Wednesday.  Just need a letter on office letterhead sent to extend FMLA."  Transferred call to collaborative for best assistance with request for letter.

## 2020-07-28 NOTE — Telephone Encounter (Signed)
Patient's daughter in law states she needs an extension for her FMLA. States that Shawn Chambers has his good days and bad days. States that she just found out 07/27/20 that it was about to run out on 07/30/20. It needs to be on [facility] letter head stating what is going on with Shawn Chambers and the need to be out with him.

## 2020-08-01 ENCOUNTER — Encounter: Payer: Self-pay | Admitting: *Deleted

## 2020-08-01 ENCOUNTER — Encounter: Payer: Self-pay | Admitting: Nurse Practitioner

## 2020-08-01 NOTE — Progress Notes (Signed)
Faxed letter to [facility] specialist at [phone]. Noted that if this letter is not adequate, please fax the necessary form to office for completion.

## 2020-08-25 ENCOUNTER — Telehealth: Payer: Self-pay | Admitting: *Deleted

## 2020-08-25 NOTE — Telephone Encounter (Signed)
Call from [place] at [facility] that patient died in facility on Sep 21, 2020 at 8:20 pm. MD notified.

## 2020-09-12 DEATH — deceased
